# Patient Record
Sex: Female | Born: 1935
Health system: Southern US, Community
[De-identification: ages and names within clinical notes are randomized; demographics above are authoritative.]

## PROBLEM LIST (undated history)

## (undated) DIAGNOSIS — E785 Hyperlipidemia, unspecified: Secondary | ICD-10-CM

## (undated) DIAGNOSIS — I10 Essential (primary) hypertension: Secondary | ICD-10-CM

## (undated) DIAGNOSIS — F32A Depression, unspecified: Secondary | ICD-10-CM

## (undated) DIAGNOSIS — M359 Systemic involvement of connective tissue, unspecified: Secondary | ICD-10-CM

## (undated) DIAGNOSIS — F419 Anxiety disorder, unspecified: Secondary | ICD-10-CM

## (undated) DIAGNOSIS — K219 Gastro-esophageal reflux disease without esophagitis: Secondary | ICD-10-CM

## (undated) DIAGNOSIS — IMO0002 Reserved for concepts with insufficient information to code with codable children: Secondary | ICD-10-CM

## (undated) DIAGNOSIS — K449 Diaphragmatic hernia without obstruction or gangrene: Secondary | ICD-10-CM

## (undated) DIAGNOSIS — F329 Major depressive disorder, single episode, unspecified: Secondary | ICD-10-CM

## (undated) HISTORY — DX: Anxiety disorder, unspecified: F41.9

## (undated) HISTORY — PX: RECTOCELE REPAIR: SHX761

## (undated) HISTORY — DX: Depression, unspecified: F32.A

## (undated) HISTORY — DX: Gastro-esophageal reflux disease without esophagitis: K21.9

## (undated) HISTORY — DX: Reserved for concepts with insufficient information to code with codable children: IMO0002

## (undated) HISTORY — PX: CYSTOCELE REPAIR: SHX163

## (undated) HISTORY — DX: Hyperlipidemia, unspecified: E78.5

## (undated) HISTORY — PX: BREAST SURGERY: SHX581

## (undated) HISTORY — DX: Major depressive disorder, single episode, unspecified: F32.9

---

## 1995-12-01 HISTORY — PX: ABDOMINAL HYSTERECTOMY: SHX81

## 2004-12-30 ENCOUNTER — Ambulatory Visit: Payer: Self-pay | Admitting: Internal Medicine

## 2006-05-27 ENCOUNTER — Ambulatory Visit: Payer: Self-pay | Admitting: Internal Medicine

## 2007-05-31 ENCOUNTER — Ambulatory Visit: Payer: Self-pay | Admitting: Internal Medicine

## 2008-06-04 ENCOUNTER — Ambulatory Visit: Payer: Self-pay | Admitting: Internal Medicine

## 2009-07-05 ENCOUNTER — Ambulatory Visit: Payer: Self-pay | Admitting: Internal Medicine

## 2010-09-24 ENCOUNTER — Ambulatory Visit: Payer: Self-pay | Admitting: Internal Medicine

## 2011-12-07 ENCOUNTER — Ambulatory Visit: Payer: Self-pay | Admitting: Ophthalmology

## 2011-12-07 DIAGNOSIS — I498 Other specified cardiac arrhythmias: Secondary | ICD-10-CM | POA: Diagnosis not present

## 2011-12-07 DIAGNOSIS — H269 Unspecified cataract: Secondary | ICD-10-CM | POA: Diagnosis not present

## 2011-12-07 DIAGNOSIS — Z79899 Other long term (current) drug therapy: Secondary | ICD-10-CM | POA: Diagnosis not present

## 2011-12-07 DIAGNOSIS — M129 Arthropathy, unspecified: Secondary | ICD-10-CM | POA: Diagnosis not present

## 2011-12-07 DIAGNOSIS — M545 Low back pain, unspecified: Secondary | ICD-10-CM | POA: Diagnosis not present

## 2011-12-07 DIAGNOSIS — R42 Dizziness and giddiness: Secondary | ICD-10-CM | POA: Diagnosis not present

## 2011-12-07 DIAGNOSIS — E785 Hyperlipidemia, unspecified: Secondary | ICD-10-CM | POA: Diagnosis not present

## 2011-12-07 DIAGNOSIS — I10 Essential (primary) hypertension: Secondary | ICD-10-CM | POA: Diagnosis not present

## 2011-12-07 DIAGNOSIS — E7800 Pure hypercholesterolemia, unspecified: Secondary | ICD-10-CM | POA: Diagnosis not present

## 2011-12-07 DIAGNOSIS — H2510 Age-related nuclear cataract, unspecified eye: Secondary | ICD-10-CM | POA: Diagnosis not present

## 2011-12-07 DIAGNOSIS — K219 Gastro-esophageal reflux disease without esophagitis: Secondary | ICD-10-CM | POA: Diagnosis not present

## 2012-01-11 DIAGNOSIS — Z79899 Other long term (current) drug therapy: Secondary | ICD-10-CM | POA: Diagnosis not present

## 2012-01-11 DIAGNOSIS — M949 Disorder of cartilage, unspecified: Secondary | ICD-10-CM | POA: Diagnosis not present

## 2012-01-11 DIAGNOSIS — E7800 Pure hypercholesterolemia, unspecified: Secondary | ICD-10-CM | POA: Diagnosis not present

## 2012-01-11 DIAGNOSIS — I10 Essential (primary) hypertension: Secondary | ICD-10-CM | POA: Diagnosis not present

## 2012-01-11 DIAGNOSIS — M899 Disorder of bone, unspecified: Secondary | ICD-10-CM | POA: Diagnosis not present

## 2012-03-30 DIAGNOSIS — M549 Dorsalgia, unspecified: Secondary | ICD-10-CM | POA: Diagnosis not present

## 2012-03-30 DIAGNOSIS — I10 Essential (primary) hypertension: Secondary | ICD-10-CM | POA: Diagnosis not present

## 2012-03-30 DIAGNOSIS — M79609 Pain in unspecified limb: Secondary | ICD-10-CM | POA: Diagnosis not present

## 2012-03-30 DIAGNOSIS — E7800 Pure hypercholesterolemia, unspecified: Secondary | ICD-10-CM | POA: Diagnosis not present

## 2012-04-06 DIAGNOSIS — E7800 Pure hypercholesterolemia, unspecified: Secondary | ICD-10-CM | POA: Diagnosis not present

## 2012-04-06 DIAGNOSIS — I10 Essential (primary) hypertension: Secondary | ICD-10-CM | POA: Diagnosis not present

## 2012-04-06 DIAGNOSIS — R5383 Other fatigue: Secondary | ICD-10-CM | POA: Diagnosis not present

## 2012-04-06 DIAGNOSIS — R7989 Other specified abnormal findings of blood chemistry: Secondary | ICD-10-CM | POA: Diagnosis not present

## 2012-04-06 DIAGNOSIS — R5381 Other malaise: Secondary | ICD-10-CM | POA: Diagnosis not present

## 2012-04-06 LAB — LIPID PANEL: Cholesterol: 184 mg/dL (ref 0–200)

## 2012-04-06 LAB — TSH: TSH: 1.44 u[IU]/mL (ref 0.41–5.90)

## 2012-04-06 LAB — BASIC METABOLIC PANEL: Potassium: 4.6 mmol/L (ref 3.4–5.3)

## 2012-05-06 ENCOUNTER — Ambulatory Visit: Payer: Self-pay | Admitting: Internal Medicine

## 2012-05-06 DIAGNOSIS — R928 Other abnormal and inconclusive findings on diagnostic imaging of breast: Secondary | ICD-10-CM | POA: Diagnosis not present

## 2012-05-06 DIAGNOSIS — Z1231 Encounter for screening mammogram for malignant neoplasm of breast: Secondary | ICD-10-CM | POA: Diagnosis not present

## 2012-05-10 LAB — HM MAMMOGRAPHY

## 2012-05-17 DIAGNOSIS — Z1211 Encounter for screening for malignant neoplasm of colon: Secondary | ICD-10-CM | POA: Diagnosis not present

## 2012-09-05 ENCOUNTER — Telehealth: Payer: Self-pay | Admitting: Internal Medicine

## 2012-09-05 MED ORDER — ALPRAZOLAM 0.5 MG PO TABS: 0.5000 mg | ORAL_TABLET | Freq: Every evening | ORAL | Status: DC | PRN

## 2012-09-05 NOTE — Telephone Encounter (Signed)
Ok to refill alprazolam .5mg  q day prn #30 with one refill.

## 2012-09-05 NOTE — Telephone Encounter (Signed)
Caller: Joory/Patient; Phone: [phone]; Reason for Call: Patient calling to get a refill on her medication Alprazolam 0.  5mg .  She uses [facility] in [place].  Thanks

## 2012-09-05 NOTE — Telephone Encounter (Signed)
[facility] 

## 2012-09-30 ENCOUNTER — Ambulatory Visit: Payer: Self-pay | Admitting: Internal Medicine

## 2012-10-21 DIAGNOSIS — E7800 Pure hypercholesterolemia, unspecified: Secondary | ICD-10-CM | POA: Diagnosis not present

## 2012-10-21 DIAGNOSIS — I10 Essential (primary) hypertension: Secondary | ICD-10-CM | POA: Diagnosis not present

## 2012-10-25 ENCOUNTER — Ambulatory Visit: Payer: Self-pay | Admitting: Internal Medicine

## 2012-10-27 ENCOUNTER — Ambulatory Visit: Payer: Self-pay | Admitting: Internal Medicine

## 2012-10-28 ENCOUNTER — Ambulatory Visit: Payer: Self-pay | Admitting: Internal Medicine

## 2012-11-04 DIAGNOSIS — M19049 Primary osteoarthritis, unspecified hand: Secondary | ICD-10-CM | POA: Diagnosis not present

## 2012-11-04 DIAGNOSIS — E7800 Pure hypercholesterolemia, unspecified: Secondary | ICD-10-CM | POA: Diagnosis not present

## 2012-11-04 DIAGNOSIS — I10 Essential (primary) hypertension: Secondary | ICD-10-CM | POA: Diagnosis not present

## 2012-11-17 DIAGNOSIS — R000 Tachycardia, unspecified: Secondary | ICD-10-CM | POA: Diagnosis not present

## 2012-11-17 DIAGNOSIS — E782 Mixed hyperlipidemia: Secondary | ICD-10-CM | POA: Diagnosis not present

## 2012-11-17 DIAGNOSIS — I10 Essential (primary) hypertension: Secondary | ICD-10-CM | POA: Diagnosis not present

## 2012-11-17 DIAGNOSIS — I471 Supraventricular tachycardia: Secondary | ICD-10-CM | POA: Diagnosis not present

## 2012-12-06 DIAGNOSIS — I471 Supraventricular tachycardia: Secondary | ICD-10-CM | POA: Diagnosis not present

## 2012-12-16 DIAGNOSIS — R0602 Shortness of breath: Secondary | ICD-10-CM | POA: Diagnosis not present

## 2012-12-23 DIAGNOSIS — I10 Essential (primary) hypertension: Secondary | ICD-10-CM | POA: Diagnosis not present

## 2012-12-23 DIAGNOSIS — E782 Mixed hyperlipidemia: Secondary | ICD-10-CM | POA: Diagnosis not present

## 2013-01-20 DIAGNOSIS — H2510 Age-related nuclear cataract, unspecified eye: Secondary | ICD-10-CM | POA: Diagnosis not present

## 2013-04-14 ENCOUNTER — Ambulatory Visit: Payer: Self-pay | Admitting: Internal Medicine

## 2013-05-01 ENCOUNTER — Ambulatory Visit: Payer: Self-pay | Admitting: Internal Medicine

## 2013-05-04 ENCOUNTER — Encounter: Payer: Self-pay | Admitting: Internal Medicine

## 2013-05-04 ENCOUNTER — Ambulatory Visit (INDEPENDENT_AMBULATORY_CARE_PROVIDER_SITE_OTHER): Payer: Medicare Other | Admitting: Internal Medicine

## 2013-05-04 VITALS — BP 130/80 | HR 63 | Temp 98.6°F | Ht 62.0 in | Wt 178.0 lb

## 2013-05-04 DIAGNOSIS — E7800 Pure hypercholesterolemia, unspecified: Secondary | ICD-10-CM

## 2013-05-04 DIAGNOSIS — R5383 Other fatigue: Secondary | ICD-10-CM | POA: Diagnosis not present

## 2013-05-04 DIAGNOSIS — Z1239 Encounter for other screening for malignant neoplasm of breast: Secondary | ICD-10-CM

## 2013-05-04 DIAGNOSIS — G8929 Other chronic pain: Secondary | ICD-10-CM

## 2013-05-04 DIAGNOSIS — I10 Essential (primary) hypertension: Secondary | ICD-10-CM

## 2013-05-04 DIAGNOSIS — M549 Dorsalgia, unspecified: Secondary | ICD-10-CM

## 2013-05-04 DIAGNOSIS — F411 Generalized anxiety disorder: Secondary | ICD-10-CM

## 2013-05-04 DIAGNOSIS — R5381 Other malaise: Secondary | ICD-10-CM

## 2013-05-04 DIAGNOSIS — F419 Anxiety disorder, unspecified: Secondary | ICD-10-CM

## 2013-05-06 ENCOUNTER — Encounter: Payer: Self-pay | Admitting: Internal Medicine

## 2013-05-06 ENCOUNTER — Other Ambulatory Visit: Payer: Self-pay | Admitting: Internal Medicine

## 2013-05-06 DIAGNOSIS — M549 Dorsalgia, unspecified: Secondary | ICD-10-CM | POA: Insufficient documentation

## 2013-05-06 DIAGNOSIS — F419 Anxiety disorder, unspecified: Secondary | ICD-10-CM | POA: Insufficient documentation

## 2013-05-06 MED ORDER — ROSUVASTATIN CALCIUM 10 MG PO TABS: ORAL_TABLET | ORAL | Status: DC

## 2013-05-06 MED ORDER — METOPROLOL SUCCINATE ER 25 MG PO TB24: ORAL_TABLET | ORAL | Status: DC

## 2013-05-06 NOTE — Assessment & Plan Note (Signed)
Desires no further intervention at this time.  Follow  

## 2013-05-06 NOTE — Assessment & Plan Note (Signed)
Blood pressure as outlined.  Same medication regimen.  Follow. Check metabolic panel.

## 2013-05-06 NOTE — Progress Notes (Signed)
Refilled toprol and crestor for 3 months with 3 refills.

## 2013-05-06 NOTE — Assessment & Plan Note (Signed)
On Crestor.  Low cholesterol diet and exercise as tolerated.  Follow.  

## 2013-05-06 NOTE — Assessment & Plan Note (Signed)
Overall stable.  Takes xanax prn.  Follow.

## 2013-05-06 NOTE — Progress Notes (Signed)
Subjective:    [name], female    DOB: March 06, 1930, 79 y.o.   MRN: [id number]  HPI 79 year old female with past medical history of hypercholesterolemia, degenerative disc disease, back pain, hypertension and increased anxiety.  She comes in today to follow up on these issues as well as for a  Complete physical exam.  She states she is doing relatively well.  Still coping with her husband's death.  Feels she is doing relatively well.  She has been doing a lot of mowing and clipping hedges.  This aggravates her back and hip - at times.  Takes tramadol and ibuprofen prn.  Desires no further intervention at this time.  She did have an episode several months ago - where she rolled over and noticed some increased heart rate.  Saw Dr Lady Gary.  Had ECHO.  States everything checked out fine.  Has not had any further problems since.  Overall she feels things are stable.    Past Medical History  Diagnosis Date  . Hyperlipidemia   . Degenerative disc disease   . Anxiety   . Depression   . GERD (gastroesophageal reflux disease)     Current Outpatient Prescriptions on File Prior to Visit  Medication Sig Dispense Refill  . ALPRAZolam (XANAX) 0.5 MG tablet Take 1 tablet (0.5 mg total) by mouth at bedtime as needed.  30 tablet  1  . aspirin 81 MG tablet Take 81 mg by mouth daily.      . lansoprazole (PREVACID) 30 MG capsule Take 30 mg by mouth every other day. Take 1 capsule by mouth every morning. ( At least 30 minutes prior to food)      . metoprolol succinate (TOPROL-XL) 25 MG 24 hr tablet Take 1/2 tablets by mouth twice a day      . RABEprazole (ACIPHEX) 20 MG tablet Take 20 mg by mouth daily.      . rosuvastatin (CRESTOR) 10 MG tablet Take 5 mg by mouth. Take 1/2 tablets by mouth twice a week      . traMADol (ULTRAM) 50 MG tablet Take 1 tablet by mouth twice a day as needed for pain      . meloxicam (MOBIC) 7.5 MG tablet Take 1 tablet by mouth once a day with meals      . pravastatin  (PRAVACHOL) 20 MG tablet Take 20 mg by mouth daily. Every night       No current facility-administered medications on file prior to visit.    Review of Systems Patient denies any headache, lightheadedness or dizziness.  No significant sinus or allergy symptoms.  No chest pain, tightness or palpitations.  No increased shortness of breath, cough or congestion.  No nausea or vomiting.  No acid reflux.  No abdominal pain or cramping.  No bowel change, such as diarrhea, constipation, BRBPR or melana.  No urine change.  Some persistent back and hip pain as outlined.  Does report some increased fatigue.  Overall feels she is doing relatively well.      Objective:   Physical Exam Filed Vitals:   05/04/13 1026  BP: 130/80  Pulse: 63  Temp: 98.6 F (70 C)   79 year old female in no acute distress.   HEENT:  Nares- clear.  Oropharynx - without lesions. NECK:  Supple.  Nontender.  No audible bruit.  HEART:  Appears to be regular. LUNGS:  No crackles or wheezing audible.  Respirations even and unlabored.  RADIAL PULSE:  Equal  bilaterally.    BREASTS:  No nipple discharge or nipple retraction present.  Could not appreciate any distinct nodules or axillary adenopathy.  ABDOMEN:  Soft, nontender.  Bowel sounds present and normal.  No audible abdominal bruit.  GU:  Normal external genitalia.  Vaginal vault without lesions.  S/p hysterectomy.  Could not appreciate any adnexal masses or tenderness.   RECTAL:  Heme negative.   EXTREMITIES:  No increased edema present.  DP pulses palpable and equal bilaterally.       Assessment & Plan:  CARDIOVASCULAR.  Stable.   FATIGUE.  Check cbc, met c and tsh.    HEALTH MAINTENANCE.  Physical today.  Schedule mammogram.  Declines colonoscopy.

## 2013-05-12 ENCOUNTER — Other Ambulatory Visit (INDEPENDENT_AMBULATORY_CARE_PROVIDER_SITE_OTHER): Payer: Medicare Other

## 2013-05-12 DIAGNOSIS — I10 Essential (primary) hypertension: Secondary | ICD-10-CM | POA: Diagnosis not present

## 2013-05-12 DIAGNOSIS — E7800 Pure hypercholesterolemia, unspecified: Secondary | ICD-10-CM

## 2013-05-12 DIAGNOSIS — R5383 Other fatigue: Secondary | ICD-10-CM | POA: Diagnosis not present

## 2013-05-12 DIAGNOSIS — R5381 Other malaise: Secondary | ICD-10-CM | POA: Diagnosis not present

## 2013-05-12 LAB — CBC WITH DIFFERENTIAL/PLATELET
Basophils Relative: 0.7 % (ref 0.0–3.0)
Eosinophils Relative: 1.4 % (ref 0.0–5.0)
Lymphocytes Relative: 28.4 % (ref 12.0–46.0)
MCV: 91.9 fl (ref 78.0–100.0)
Monocytes Absolute: 0.6 10*3/uL (ref 0.1–1.0)
Monocytes Relative: 13 % — ABNORMAL HIGH (ref 3.0–12.0)
Neutrophils Relative %: 56.5 % (ref 43.0–77.0)
Platelets: 287 10*3/uL (ref 150.0–400.0)
RBC: 4.65 Mil/uL (ref 3.87–5.11)
WBC: 5 10*3/uL (ref 4.5–10.5)

## 2013-05-12 LAB — BASIC METABOLIC PANEL
BUN: 14 mg/dL (ref 6–23)
Creatinine, Ser: 0.7 mg/dL (ref 0.4–1.2)
GFR: 88.21 mL/min (ref >60.00)
Glucose, Bld: 96 mg/dL (ref 70–99)
Potassium: 4.5 mEq/L (ref 3.5–5.1)

## 2013-05-12 LAB — LIPID PANEL
Cholesterol: 166 mg/dL (ref 0–200)
LDL Cholesterol: 99 mg/dL (ref 0–99)
Triglycerides: 112 mg/dL (ref 0.0–149.0)
VLDL: 22.4 mg/dL (ref 0.0–40.0)

## 2013-05-12 LAB — HEPATIC FUNCTION PANEL
ALT: 15 U/L (ref 0–35)
AST: 20 U/L (ref 0–37)
Albumin: 3.5 g/dL (ref 3.5–5.2)

## 2013-05-12 LAB — TSH: TSH: 1.18 u[IU]/mL (ref 0.35–5.50)

## 2013-05-15 ENCOUNTER — Encounter: Payer: Self-pay | Admitting: *Deleted

## 2013-05-16 ENCOUNTER — Telehealth: Payer: Self-pay | Admitting: Internal Medicine

## 2013-05-16 NOTE — Telephone Encounter (Signed)
Letter mailed yesterday-pt informed of results

## 2013-05-16 NOTE — Telephone Encounter (Signed)
Patient wanting her lab results and a copy mailed too.

## 2013-05-25 ENCOUNTER — Telehealth: Payer: Self-pay | Admitting: *Deleted

## 2013-05-25 NOTE — Telephone Encounter (Signed)
Has she been taking this and did I prescribe it.  If has been taking regularly and I have been prescribing - then ok to refill mobic 7.5mg  q day prn #30 with no refills.

## 2013-05-25 NOTE — Telephone Encounter (Signed)
Patient would like to know if you will give new script for Meloxicam  For back pain, please advise.

## 2013-05-26 ENCOUNTER — Other Ambulatory Visit: Payer: Self-pay | Admitting: *Deleted

## 2013-05-26 MED ORDER — MELOXICAM 7.5 MG PO TABS: 7.5000 mg | ORAL_TABLET | ORAL | Status: DC | PRN

## 2013-05-26 NOTE — Telephone Encounter (Signed)
Refilled Mobic #30 - pt states that Dr. Artis Flock prescribed it about 5 years ago & she no longer sees him.

## 2013-06-19 ENCOUNTER — Telehealth: Payer: Self-pay | Admitting: Internal Medicine

## 2013-06-19 MED ORDER — RABEPRAZOLE SODIUM 20 MG PO TBEC: 20.0000 mg | DELAYED_RELEASE_TABLET | Freq: Every day | ORAL | Status: DC

## 2013-06-19 MED ORDER — ALPRAZOLAM 0.5 MG PO TABS: 0.5000 mg | ORAL_TABLET | Freq: Every evening | ORAL | Status: DC | PRN

## 2013-06-19 NOTE — Telephone Encounter (Signed)
States she has spoken with [facility] mail order about why she has not received Aciphex 20 mg #90 pills.  Was told it was now rabeprazole and pt needs a new Rx to order by this name.  Pt states she only has #3 pills left.  Also wondering if you should see if she has taken this before to be sure she has not had side effects to it.  Pt states she is allergic to a lot of stuff.

## 2013-06-19 NOTE — Telephone Encounter (Signed)
Pt also [facility] [place]-please advise (reflux med was sent to [facility] & pt informed)

## 2013-06-19 NOTE — Telephone Encounter (Signed)
Ok's refill for xanax #30 with one refill.  Also regarding the aciphex, this is just the generic for aciphex - should tolerate.  Thanks.

## 2013-06-29 ENCOUNTER — Ambulatory Visit: Payer: Self-pay | Admitting: Internal Medicine

## 2013-06-29 DIAGNOSIS — R928 Other abnormal and inconclusive findings on diagnostic imaging of breast: Secondary | ICD-10-CM | POA: Diagnosis not present

## 2013-06-29 DIAGNOSIS — Z1231 Encounter for screening mammogram for malignant neoplasm of breast: Secondary | ICD-10-CM | POA: Diagnosis not present

## 2013-06-29 LAB — HM MAMMOGRAPHY

## 2013-07-02 ENCOUNTER — Encounter: Payer: Self-pay | Admitting: Internal Medicine

## 2013-07-06 ENCOUNTER — Encounter: Payer: Self-pay | Admitting: Internal Medicine

## 2013-07-13 DIAGNOSIS — D3709 Neoplasm of uncertain behavior of other specified sites of the oral cavity: Secondary | ICD-10-CM | POA: Diagnosis not present

## 2013-07-13 DIAGNOSIS — D3705 Neoplasm of uncertain behavior of pharynx: Secondary | ICD-10-CM | POA: Diagnosis not present

## 2013-08-03 ENCOUNTER — Other Ambulatory Visit: Payer: Self-pay | Admitting: *Deleted

## 2013-08-03 MED ORDER — ALPRAZOLAM 0.5 MG PO TABS: 0.5000 mg | ORAL_TABLET | Freq: Every evening | ORAL | Status: DC | PRN

## 2013-08-03 NOTE — Telephone Encounter (Signed)
Pt called today to request a refill on her Xanax. However, she would like to get a few more than 30 due to having to taking an additional one here & there when her BP increases. Please advise (pt also states that she can not come to office to pick up Rx because she does not drive & lives in [place]. Her daughter has to take off just to get her here). Her next appt is in December

## 2013-08-03 NOTE — Telephone Encounter (Signed)
i refilled #45 with no refills.  She will need to come in for questionnaire, etc prior to next rx.

## 2013-08-03 NOTE — Telephone Encounter (Signed)
Pt notified that Rx was faxed to [facility] [place]- And aware that she will NTBS for further refills

## 2013-08-04 ENCOUNTER — Other Ambulatory Visit: Payer: Self-pay | Admitting: *Deleted

## 2013-08-04 NOTE — Telephone Encounter (Signed)
Called pharmacy & advised that pt can take 2nd pill prn

## 2013-08-04 NOTE — Telephone Encounter (Signed)
Needing a call back from the nurse the prescription instruction for the patient's Alprazolam should have changed. [facility] [place]

## 2013-08-07 ENCOUNTER — Telehealth: Payer: Self-pay | Admitting: Internal Medicine

## 2013-08-07 MED ORDER — ALPRAZOLAM 0.5 MG PO TABS: 0.5000 mg | ORAL_TABLET | Freq: Every evening | ORAL | Status: DC | PRN

## 2013-08-07 NOTE — Telephone Encounter (Signed)
Alprazolam directions should have changed but Rx states the same as previous.  Quantity did change from #30 to #45.  Please advise correct instructions for medication.

## 2013-08-07 NOTE — Telephone Encounter (Signed)
Updated directions.

## 2013-08-16 ENCOUNTER — Telehealth: Payer: Self-pay | Admitting: Internal Medicine

## 2013-08-16 NOTE — Telephone Encounter (Signed)
Pt called wanting to know if she can take a whole She wakes up in morning (2 times) with her heart beat un even and when she checks her bp on her machine it won't read comes up error.  She will walk around take an asprin and her bp meds 1/2 xanax  And in about 1 hour she is feeling better Sent pt to triage

## 2013-08-16 NOTE — Telephone Encounter (Signed)
Pt states that she has seen Cardiology a few months ago. She will call them today to let them know about today's incident. However, she feels like this is related to waking up from her dream this morning. She said this happens sometimes. She didn't feel that it was urgent enough to be seen today. Pt advised to contact cardiology if this happens again & to keep her appt on 10/8. Pt verbalized understanding

## 2013-08-16 NOTE — Telephone Encounter (Signed)
Patient Information:  Caller Name: Linea  Phone: [phone]  Patient: Sandra Reyes  Gender: Female  DOB: 08/22/1930  Age: 79 Years  PCP: Dale Alexander  Office Follow Up:  Does the office need to follow up with this patient?: No  Instructions For The Office: N/A  RN Note:  Similiar arrythmia episode in past 6 months. States unable to come to office 08/16/13 due to no one available to bring her.  Reluctant to bother daughters who work. [place]  Has life alert system. Plans to keep previously scheduled appointment for 09/06/13.  Agreed not to adjust medication dose until sees MD.  Symptoms  Reason For Call & Symptoms: Woke up with palpitations that resolved within 1 hour.  BP initially would not read; then was 157/86 in left arm with pulse 77.  Asking if should take whole Toprol XL instead of 1/2 tab.  Reports some recent increase of anxiety and feeling lonely.  Reviewed Health History In EMR: Yes  Reviewed Medications In EMR: Yes  Reviewed Allergies In EMR: Yes  Reviewed Surgeries / Procedures: Yes  Date of Onset of Symptoms: 08/16/2013  Treatments Tried: took whole Toprol XL instead of half, Xanax, low dose ASA, walked around, drank fluids  Treatments Tried Worked: Yes  Guideline(s) Used:  Heart Rate and Heartbeat Questions  Disposition Per Guideline:   See Today in Office  Reason For Disposition Reached:   Heart beating very rapidly (e.g., > 140 / minute) and not present now (EXCEPTION: during exercise)  Advice Given:  Reassurance  Everybody has palpitations at some point in their lives. Sometimes it is simply a heightened awareness of the heart's normal beating.  Occasional extra heart beats are experienced by most everyone. Lack of sleep, stress, and caffeinated beverages can make palpitations worse.  Patients with anxiety or stress may describe a "rapid heartbeat" or "pounding" in their chest from their heart beating.  Health Basics  Exercise: Regular exercise will improve your overall health, improve your mood, and is a simple method to reduce stress.  Liquid Intake: Drink adequate liquids, 6-8 glasses of water daily.  Call Back If:  Chest pain, lightheadedness, or difficulty breathing occurs  Heart beating more than 130 beats / minute  More than 3 extra or skipped beats / minute  You become worse.  Patient Refused Recommendation:  Patient Refused Appt, Patient Requests Appt At Later Date  Has appointment for 09/06/13

## 2013-08-16 NOTE — Telephone Encounter (Signed)
Reviewed her message.  She sees Dr Lady Gary for increased heart rate.  Given she had difficulty getting a blood pressure and increased heart rate, etc - rec needs eval.  rec appt with cardiology.  Let me know if agreeable and I will place order for referral.

## 2013-08-17 DIAGNOSIS — H2510 Age-related nuclear cataract, unspecified eye: Secondary | ICD-10-CM | POA: Diagnosis not present

## 2013-09-05 ENCOUNTER — Encounter: Payer: Self-pay | Admitting: *Deleted

## 2013-09-06 ENCOUNTER — Encounter: Payer: Self-pay | Admitting: Internal Medicine

## 2013-09-06 ENCOUNTER — Ambulatory Visit (INDEPENDENT_AMBULATORY_CARE_PROVIDER_SITE_OTHER): Payer: Medicare Other | Admitting: Internal Medicine

## 2013-09-06 VITALS — BP 110/62 | HR 59 | Temp 98.0°F | Ht 62.0 in | Wt 181.0 lb

## 2013-09-06 DIAGNOSIS — R269 Unspecified abnormalities of gait and mobility: Secondary | ICD-10-CM

## 2013-09-06 DIAGNOSIS — I10 Essential (primary) hypertension: Secondary | ICD-10-CM | POA: Diagnosis not present

## 2013-09-06 DIAGNOSIS — E7800 Pure hypercholesterolemia, unspecified: Secondary | ICD-10-CM | POA: Diagnosis not present

## 2013-09-06 DIAGNOSIS — R2681 Unsteadiness on feet: Secondary | ICD-10-CM

## 2013-09-06 DIAGNOSIS — G609 Hereditary and idiopathic neuropathy, unspecified: Secondary | ICD-10-CM

## 2013-09-06 DIAGNOSIS — G8929 Other chronic pain: Secondary | ICD-10-CM

## 2013-09-06 DIAGNOSIS — F411 Generalized anxiety disorder: Secondary | ICD-10-CM

## 2013-09-06 DIAGNOSIS — M549 Dorsalgia, unspecified: Secondary | ICD-10-CM

## 2013-09-06 DIAGNOSIS — Z23 Encounter for immunization: Secondary | ICD-10-CM

## 2013-09-06 DIAGNOSIS — G629 Polyneuropathy, unspecified: Secondary | ICD-10-CM

## 2013-09-06 DIAGNOSIS — F419 Anxiety disorder, unspecified: Secondary | ICD-10-CM

## 2013-09-06 MED ORDER — ALPRAZOLAM 0.5 MG PO TABS: 0.5000 mg | ORAL_TABLET | Freq: Every evening | ORAL | Status: DC | PRN

## 2013-09-06 NOTE — Progress Notes (Signed)
Subjective:    [name], female    DOB: Aug 02, 1930, 79 y.o.   MRN: [id number]  HPI 79 year old female with past medical history of hypercholesterolemia, degenerative disc disease, back pain, hypertension and increased anxiety.  She comes in today for a scheduled follow up.  She states she is doing relatively well.  Still coping with her husband's death.  Some increased stress related to this.  Also increased stress with her gums.  Has had some issues with her teeth.  Increased stress related to this as well.  Teeth and gums are better.  She recently had some issues with increased heart rate and blood pressure variations.  Better now.  Has seen Dr Lady Gary.  Had ECHO.  States everything checked out fine.  Overall she feels things are stable.  States usually her heart rate is 60-70 and blood pressure - ok.  Occasionally will increase in the 140s.     Past Medical History  Diagnosis Date  . Hyperlipidemia   . Degenerative disc disease   . Anxiety   . Depression   . GERD (gastroesophageal reflux disease)     Current Outpatient Prescriptions on File Prior to Visit  Medication Sig Dispense Refill  . ALPRAZolam (XANAX) 0.5 MG tablet Take 1 tablet (0.5 mg total) by mouth at bedtime as needed for anxiety.  45 tablet  0  . aspirin 81 MG tablet Take 81 mg by mouth daily.      . metoprolol succinate (TOPROL-XL) 25 MG 24 hr tablet Take 1/2 tablets by mouth twice a day  90 tablet  3  . RABEprazole (ACIPHEX) 20 MG tablet Take 1 tablet (20 mg total) by mouth daily.  90 tablet  3  . traMADol (ULTRAM) 50 MG tablet Take 1 tablet by mouth twice a day as needed for pain       No current facility-administered medications on file prior to visit.    Review of Systems Patient denies any headache, lightheadedness or dizziness.  No significant sinus or allergy symptoms.  No chest pain, tightness.  Some previous increased heart rate and palpitations.  Better now.   No increased shortness of breath, cough  or congestion.  No nausea or vomiting.  No acid reflux.  No abdominal pain or cramping.  No bowel change, such as diarrhea, constipation, BRBPR or melana.  No urine change.  Some persistent back and hip pain as outlined.  Does report some increased fatigue.  Overall feels she is doing relatively well.       Objective:   Physical Exam  Filed Vitals:   09/06/13 0903  BP: 110/62  Pulse: 59  Temp: 98 F (18.51 C)   79 year old female in no acute distress.   HEENT:  Nares- clear.  Oropharynx - without lesions. NECK:  Supple.  Nontender.  No audible bruit.  HEART:  Appears to be regular. LUNGS:  No crackles or wheezing audible.  Respirations even and unlabored.  RADIAL PULSE:  Equal bilaterally.   ABDOMEN:  Soft, nontender.  Bowel sounds present and normal.  No audible abdominal bruit.    EXTREMITIES:  No increased edema present.  DP pulses palpable and equal bilaterally.   NEUROLOGY:  Decreased sensation pin prick bilateral feet that extends up to the ankle.      Assessment & Plan:  CARDIOVASCULAR.  Stable.   NEUROLOGY.  Persistent foot and leg pain and decreased sensation.  Check B12.  Check NCS of the lower extremities.  HEALTH MAINTENANCE.  Physical 05/04/13.  Mammogram 06/29/13 - Birads II.  Declines colonoscopy.

## 2013-09-08 DIAGNOSIS — Z79899 Other long term (current) drug therapy: Secondary | ICD-10-CM | POA: Diagnosis not present

## 2013-09-10 ENCOUNTER — Encounter: Payer: Self-pay | Admitting: Internal Medicine

## 2013-09-10 NOTE — Assessment & Plan Note (Signed)
Desires no further intervention at this time.  Follow  

## 2013-09-10 NOTE — Assessment & Plan Note (Signed)
Blood pressure as outlined.  Same medication regimen.  Follow. Follow metabolic panel.  

## 2013-09-10 NOTE — Assessment & Plan Note (Signed)
Overall stable.  Takes xanax prn.  Follow.  We discussed other treatment options.  States she has tried other medications and did no tolerate.  Feels the xanax works well for her.  Follow.    

## 2013-09-10 NOTE — Assessment & Plan Note (Signed)
On Crestor.  Low cholesterol diet and exercise as tolerated.  Follow.  

## 2013-09-12 DIAGNOSIS — G609 Hereditary and idiopathic neuropathy, unspecified: Secondary | ICD-10-CM | POA: Diagnosis not present

## 2013-10-05 ENCOUNTER — Other Ambulatory Visit: Payer: Self-pay | Admitting: Internal Medicine

## 2013-10-05 NOTE — Telephone Encounter (Signed)
Need to clarify how often she is taking the xanax.  She get #45 on 09/06/13.  Per my note she was only taking prn.  Is she out now?  I can refill x 1, but if she is using it frequently, will need to get her in and try another medication to control her anxeity.

## 2013-10-05 NOTE — Telephone Encounter (Signed)
Okay to refill? Last filled on 09/06/13

## 2013-10-09 ENCOUNTER — Other Ambulatory Visit: Payer: Self-pay | Admitting: Internal Medicine

## 2013-10-09 NOTE — Telephone Encounter (Signed)
Pt called checking on her rx   Pt upset about her rx not being refilled.  Pt would like you to call her before you call the rx in Please call pt  [phone] Pt is completely out of her meds

## 2013-10-10 NOTE — Telephone Encounter (Signed)
I think we sent this in yesterday.  Let me know if a problem.  Thanks.

## 2013-11-03 ENCOUNTER — Ambulatory Visit: Payer: Medicare Other | Admitting: Internal Medicine

## 2013-11-08 ENCOUNTER — Other Ambulatory Visit: Payer: Self-pay | Admitting: Internal Medicine

## 2013-11-08 ENCOUNTER — Telehealth: Payer: Self-pay | Admitting: Internal Medicine

## 2013-11-08 NOTE — Telephone Encounter (Signed)
Need to know how much she is taking now?  Will refill x 1, but I need to clarify dose.  If increasing amount, will need to set up appt to discuss.

## 2013-11-08 NOTE — Telephone Encounter (Signed)
Pt is needing refill on her xanax she will be out on sunday

## 2013-11-08 NOTE — Telephone Encounter (Signed)
Okay to refill? 

## 2013-11-08 NOTE — Telephone Encounter (Signed)
Last visit 09/06/13, ok refill? And can you clarify directions? These directions don't match previous Rx

## 2013-11-08 NOTE — Telephone Encounter (Signed)
See other note on pt

## 2013-11-09 MED ORDER — ALPRAZOLAM 0.5 MG PO TABS: ORAL_TABLET | ORAL | Status: DC

## 2013-11-09 NOTE — Telephone Encounter (Signed)
Pt notified.    Rx faxed to pharmacy.

## 2013-11-09 NOTE — Telephone Encounter (Signed)
Pt states she takes 1 tablet at bedtime. When she gets anxious during the day which occurs occasionally, she takes half a tablet. Sometimes she takes another 1/2 tab in the evening. But when she takes the 1/2 tab in the evening, she does not take the whole 1 tablet at bedtime. States some days she does not take at all.

## 2013-11-09 NOTE — Telephone Encounter (Signed)
Ok to refill xanax .5mg  - x 1.  Will monitor.  If increase use, may need to try another medication to help control anxiety and use the xanax as needed.

## 2013-11-09 NOTE — Telephone Encounter (Signed)
Okay to refill? 

## 2013-11-09 NOTE — Telephone Encounter (Signed)
See other note

## 2013-12-13 ENCOUNTER — Other Ambulatory Visit: Payer: Self-pay | Admitting: *Deleted

## 2013-12-13 ENCOUNTER — Telehealth: Payer: Self-pay | Admitting: Internal Medicine

## 2013-12-13 MED ORDER — ALPRAZOLAM 0.5 MG PO TABS: ORAL_TABLET | ORAL | Status: DC

## 2013-12-13 NOTE — Telephone Encounter (Signed)
Pt called to get her xanax refilled Pt has 4 left

## 2013-12-13 NOTE — Telephone Encounter (Signed)
rx printed

## 2013-12-14 NOTE — Telephone Encounter (Signed)
Rx faxed to [facility] [place] [facility] today

## 2014-01-11 ENCOUNTER — Ambulatory Visit: Payer: Medicare Other | Admitting: Internal Medicine

## 2014-01-15 ENCOUNTER — Telehealth: Payer: Self-pay | Admitting: Internal Medicine

## 2014-01-15 MED ORDER — ALPRAZOLAM 0.5 MG PO TABS: ORAL_TABLET | ORAL | Status: DC

## 2014-01-15 NOTE — Telephone Encounter (Signed)
Refilled xanax #45 with no refills.

## 2014-01-15 NOTE — Telephone Encounter (Signed)
Needing it filled today .  ALPRAZolam (XANAX) 0.5 MG tablet

## 2014-02-16 ENCOUNTER — Ambulatory Visit (INDEPENDENT_AMBULATORY_CARE_PROVIDER_SITE_OTHER): Payer: Medicare Other | Admitting: Internal Medicine

## 2014-02-16 ENCOUNTER — Telehealth: Payer: Self-pay | Admitting: Internal Medicine

## 2014-02-16 ENCOUNTER — Encounter: Payer: Self-pay | Admitting: Internal Medicine

## 2014-02-16 VITALS — BP 126/70 | HR 65 | Temp 98.2°F | Ht 62.0 in | Wt 185.5 lb

## 2014-02-16 DIAGNOSIS — G609 Hereditary and idiopathic neuropathy, unspecified: Secondary | ICD-10-CM

## 2014-02-16 DIAGNOSIS — R269 Unspecified abnormalities of gait and mobility: Secondary | ICD-10-CM | POA: Diagnosis not present

## 2014-02-16 DIAGNOSIS — F419 Anxiety disorder, unspecified: Secondary | ICD-10-CM

## 2014-02-16 DIAGNOSIS — F411 Generalized anxiety disorder: Secondary | ICD-10-CM

## 2014-02-16 DIAGNOSIS — G629 Polyneuropathy, unspecified: Secondary | ICD-10-CM

## 2014-02-16 DIAGNOSIS — E7800 Pure hypercholesterolemia, unspecified: Secondary | ICD-10-CM

## 2014-02-16 DIAGNOSIS — R2681 Unsteadiness on feet: Secondary | ICD-10-CM

## 2014-02-16 DIAGNOSIS — I10 Essential (primary) hypertension: Secondary | ICD-10-CM | POA: Diagnosis not present

## 2014-02-16 DIAGNOSIS — H539 Unspecified visual disturbance: Secondary | ICD-10-CM

## 2014-02-16 LAB — BASIC METABOLIC PANEL
BUN: 16 mg/dL (ref 6–23)
CO2: 31 mEq/L (ref 19–32)
CREATININE: 0.7 mg/dL (ref 0.4–1.2)
Calcium: 9.3 mg/dL (ref 8.4–10.5)
Chloride: 100 mEq/L (ref 96–112)
GFR: 81.12 mL/min (ref >60.00)
GLUCOSE: 105 mg/dL — AB (ref 70–99)
Potassium: 4.4 mEq/L (ref 3.5–5.1)
Sodium: 136 mEq/L (ref 135–145)

## 2014-02-16 LAB — HEPATIC FUNCTION PANEL
ALBUMIN: 3.9 g/dL (ref 3.5–5.2)
ALT: 17 U/L (ref 0–35)
AST: 21 U/L (ref 0–37)
Alkaline Phosphatase: 59 U/L (ref 39–117)
BILIRUBIN TOTAL: 0.3 mg/dL (ref 0.3–1.2)
Bilirubin, Direct: 0 mg/dL (ref 0.0–0.3)
Total Protein: 6.9 g/dL (ref 6.0–8.3)

## 2014-02-16 LAB — VITAMIN B12: VITAMIN B 12: 957 pg/mL — AB (ref 211–911)

## 2014-02-16 MED ORDER — ALPRAZOLAM 0.5 MG PO TABS: ORAL_TABLET | ORAL | Status: DC

## 2014-02-16 NOTE — Telephone Encounter (Signed)
Noted  

## 2014-02-16 NOTE — Progress Notes (Signed)
Subjective:    [name], female    DOB: Oct 26, 1930, 80 y.o.   MRN: [id number]  HPI 80 year old female with past medical history of hypercholesterolemia, degenerative disc disease, back pain, hypertension and increased anxiety.  She comes in today for a scheduled follow up.  She states she is doing relatively well.  Still coping with her husband's death.  Some increased stress related to this.  Doing better.   She recently had some issues with increased heart rate and blood pressure variations.  Better now.  Has seen Dr Ubaldo Glassing.  Had ECHO.  States everything checked out fine.  Overall she feels things are stable.  She does report noticing a zigzag and vision change.  Acute change.  No headache associated.  Vision back to baseline now.  No other neurological changes.     Past Medical History  Diagnosis Date  . Hyperlipidemia   . Degenerative disc disease   . Anxiety   . Depression   . GERD (gastroesophageal reflux disease)     Current Outpatient Prescriptions on File Prior to Visit  Medication Sig Dispense Refill  . ALPRAZolam (XANAX) 0.5 MG tablet Take one tablet at bedtime & 1/2 tablet during the day as needed  45 tablet  0  . aspirin 81 MG tablet Take 81 mg by mouth daily.      . Cyanocobalamin (B-12) 500 MCG SUBL Place under the tongue daily.      . metoprolol succinate (TOPROL-XL) 25 MG 24 hr tablet Take 1/2 tablets by mouth twice a day  90 tablet  3  . Multiple Vitamin (MULTIVITAMIN) tablet Take 1 tablet by mouth daily.      . RABEprazole (ACIPHEX) 20 MG tablet Take 1 tablet (20 mg total) by mouth daily.  90 tablet  3  . rosuvastatin (CRESTOR) 10 MG tablet Take 5 mg by mouth 2 (two) times a week. Take 1/2 tablets by mouth qod.      . traMADol (ULTRAM) 50 MG tablet Take 1 tablet by mouth twice a day as needed for pain      . vitamin C (ASCORBIC ACID) 250 MG tablet Take 250 mg by mouth daily.      . vitamin E 400 UNIT capsule Take 400 Units by mouth daily.       No current  facility-administered medications on file prior to visit.    Review of Systems Patient denies any headache, lightheadedness or dizziness. Vision change as outlined.   No significant sinus or allergy symptoms.  No chest pain, tightness.  Some previous increased heart rate and palpitations.  Better now.   No increased shortness of breath, cough or congestion.  No nausea or vomiting.  No acid reflux.  No abdominal pain or cramping.  No bowel change, such as diarrhea, constipation, BRBPR or melana.  No urine change.    Overall feels she is doing relatively well.       Objective:   Physical Exam  Filed Vitals:   02/16/14 1356  BP: 126/70  Pulse: 65  Temp: 98.2 F (36.8 C)   Blood pressure recheck:  13-76/47  80 year old female in no acute distress.   HEENT:  Nares- clear.  Oropharynx - without lesions. NECK:  Supple.  Nontender.  No audible bruit.  HEART:  Appears to be regular. LUNGS:  No crackles or wheezing audible.  Respirations even and unlabored.  RADIAL PULSE:  Equal bilaterally.   ABDOMEN:  Soft, nontender.  Bowel sounds  present and normal.  No audible abdominal bruit.    EXTREMITIES:  No increased edema present.  DP pulses palpable and equal bilaterally.      Assessment & Plan:  CARDIOVASCULAR.  Stable.   HEALTH MAINTENANCE.  Physical 05/04/13.  Mammogram 06/29/13 - Birads II.  Declines colonoscopy.   I spent 40 minutes with the patient and more than 50% of the time was spent in consultation regarding the above.

## 2014-02-16 NOTE — Progress Notes (Signed)
Pre-visit discussion using our clinic review tool. No additional management support is needed unless otherwise documented below in the visit note.  

## 2014-02-16 NOTE — Telephone Encounter (Signed)
FYI  Pt did not want to schedule a 3 month cpx.  She wanted to schedule a 6 month follow up 08/24/14

## 2014-02-18 ENCOUNTER — Encounter: Payer: Self-pay | Admitting: Internal Medicine

## 2014-02-18 DIAGNOSIS — G629 Polyneuropathy, unspecified: Secondary | ICD-10-CM | POA: Insufficient documentation

## 2014-02-18 DIAGNOSIS — H539 Unspecified visual disturbance: Secondary | ICD-10-CM | POA: Insufficient documentation

## 2014-02-18 NOTE — Assessment & Plan Note (Signed)
Blood pressure as outlined.  Same medication regimen.  Follow. Follow metabolic panel.  

## 2014-02-18 NOTE — Assessment & Plan Note (Signed)
On Crestor.  Low cholesterol diet and exercise as tolerated.  Follow.  

## 2014-02-18 NOTE — Assessment & Plan Note (Signed)
Vision change as outlined.  No history of migraines.  No headache.  Sees Dr George Ina.  Needs earlier appt.  Also wanted to check carotid ultrasound.  She will notify me if she is agreeable to proceed.  Start aspirin daily.  Follow.  She declines any further w/up at this time.  We discussed possible etiologies.  She continues to decline further w/up.

## 2014-02-18 NOTE — Assessment & Plan Note (Signed)
Overall stable.  Takes xanax prn.  Follow.  We discussed other treatment options.  States she has tried other medications and did no tolerate.  Feels the xanax works well for her.  Follow.

## 2014-02-18 NOTE — Assessment & Plan Note (Signed)
Nerve conduction studies appear to be c/w neuropathy.  Discussed with her today.  Discussed treatment options.  She declines.  Wants to monitor.  Check B12.

## 2014-02-19 ENCOUNTER — Encounter: Payer: Self-pay | Admitting: *Deleted

## 2014-02-19 ENCOUNTER — Telehealth: Payer: Self-pay | Admitting: Internal Medicine

## 2014-02-19 DIAGNOSIS — H539 Unspecified visual disturbance: Secondary | ICD-10-CM

## 2014-02-19 NOTE — Telephone Encounter (Signed)
The patient called asking about her referral for her carotid artery.Please advise.

## 2014-02-19 NOTE — Telephone Encounter (Signed)
See note from 02/16/14-pt ready to proceed with referral

## 2014-02-20 DIAGNOSIS — H2510 Age-related nuclear cataract, unspecified eye: Secondary | ICD-10-CM | POA: Diagnosis not present

## 2014-02-20 NOTE — Telephone Encounter (Signed)
Order placed for carotid ultrasound.  Amber to call pt with appt.

## 2014-03-08 DIAGNOSIS — I6529 Occlusion and stenosis of unspecified carotid artery: Secondary | ICD-10-CM | POA: Diagnosis not present

## 2014-04-03 ENCOUNTER — Other Ambulatory Visit: Payer: Self-pay | Admitting: *Deleted

## 2014-04-03 ENCOUNTER — Telehealth: Payer: Self-pay | Admitting: Internal Medicine

## 2014-04-03 MED ORDER — METOPROLOL SUCCINATE ER 25 MG PO TB24: ORAL_TABLET | ORAL | Status: DC

## 2014-04-03 NOTE — Telephone Encounter (Signed)
New refill sent to [facility]

## 2014-04-03 NOTE — Telephone Encounter (Signed)
Pt states she needs a new script:  Metoprolol ER 25 mg.  Says mail order [facility].

## 2014-04-18 ENCOUNTER — Other Ambulatory Visit: Payer: Self-pay | Admitting: Internal Medicine

## 2014-04-18 MED ORDER — ALPRAZOLAM 0.5 MG PO TABS
ORAL_TABLET | ORAL | Status: DC
Start: 2014-04-18 — End: 2014-04-24

## 2014-04-18 NOTE — Telephone Encounter (Signed)
ALPRAZolam (XANAX) 0.5 MG tablet  °

## 2014-04-18 NOTE — Telephone Encounter (Signed)
Refilled xanax #45 with one refill 

## 2014-04-18 NOTE — Telephone Encounter (Signed)
Last visit 02/16/14, refill?

## 2014-04-18 NOTE — Telephone Encounter (Signed)
Rx faxed to pharmacy  

## 2014-04-24 ENCOUNTER — Other Ambulatory Visit: Payer: Self-pay | Admitting: *Deleted

## 2014-04-24 MED ORDER — ALPRAZOLAM 0.5 MG PO TABS: ORAL_TABLET | ORAL | Status: DC

## 2014-04-27 ENCOUNTER — Encounter: Payer: Self-pay | Admitting: Internal Medicine

## 2014-06-29 ENCOUNTER — Telehealth: Payer: Self-pay | Admitting: *Deleted

## 2014-06-29 MED ORDER — ALPRAZOLAM 0.5 MG PO TABS: ORAL_TABLET | ORAL | Status: DC

## 2014-06-29 NOTE — Telephone Encounter (Signed)
rx called in as requested

## 2014-06-29 NOTE — Telephone Encounter (Signed)
Printed out rx for alprazolam #45 with one refill.  Since I am not in the office, will need to call in.  Thanks.

## 2014-06-29 NOTE — Telephone Encounter (Signed)
Pt called requesting Alprazolam 0.5mg  tab #45; last refill 6.30.15, last OV 3.20.15, next OV 9.25.15.  Please advise refill.

## 2014-08-24 ENCOUNTER — Encounter: Payer: Self-pay | Admitting: Internal Medicine

## 2014-08-24 ENCOUNTER — Ambulatory Visit: Payer: Medicare Other | Admitting: Internal Medicine

## 2014-08-24 ENCOUNTER — Ambulatory Visit (INDEPENDENT_AMBULATORY_CARE_PROVIDER_SITE_OTHER): Payer: Medicare Other | Admitting: Internal Medicine

## 2014-08-24 VITALS — BP 120/60 | HR 60 | Temp 98.2°F | Resp 14 | Ht 62.0 in | Wt 182.8 lb

## 2014-08-24 DIAGNOSIS — F411 Generalized anxiety disorder: Secondary | ICD-10-CM | POA: Diagnosis not present

## 2014-08-24 DIAGNOSIS — G629 Polyneuropathy, unspecified: Secondary | ICD-10-CM

## 2014-08-24 DIAGNOSIS — H539 Unspecified visual disturbance: Secondary | ICD-10-CM

## 2014-08-24 DIAGNOSIS — F419 Anxiety disorder, unspecified: Secondary | ICD-10-CM

## 2014-08-24 DIAGNOSIS — M549 Dorsalgia, unspecified: Secondary | ICD-10-CM | POA: Diagnosis not present

## 2014-08-24 DIAGNOSIS — I10 Essential (primary) hypertension: Secondary | ICD-10-CM | POA: Diagnosis not present

## 2014-08-24 DIAGNOSIS — G609 Hereditary and idiopathic neuropathy, unspecified: Secondary | ICD-10-CM

## 2014-08-24 DIAGNOSIS — G8929 Other chronic pain: Secondary | ICD-10-CM

## 2014-08-24 DIAGNOSIS — E7800 Pure hypercholesterolemia, unspecified: Secondary | ICD-10-CM

## 2014-08-24 MED ORDER — ALPRAZOLAM 0.5 MG PO TABS: ORAL_TABLET | ORAL | Status: DC

## 2014-08-24 MED ORDER — METOPROLOL SUCCINATE ER 25 MG PO TB24: ORAL_TABLET | ORAL | Status: DC

## 2014-08-24 NOTE — Progress Notes (Signed)
Pre visit review using our clinic review tool, if applicable. No additional management support is needed unless otherwise documented below in the visit note. 

## 2014-08-26 ENCOUNTER — Encounter: Payer: Self-pay | Admitting: Internal Medicine

## 2014-08-26 NOTE — Assessment & Plan Note (Addendum)
Saw Dr Tobe Sos.  Checked out fine.  Declined any further w/up.

## 2014-08-26 NOTE — Assessment & Plan Note (Signed)
Nerve conduction studies appear to be c/w neuropathy.  Have discussed treatment options.  She declines.  Wants to monitor.

## 2014-08-26 NOTE — Assessment & Plan Note (Signed)
Overall stable.  Takes xanax.  Follow.  We discussed other treatment options.  States she has tried other medications and did not tolerate.  Feels the xanax works well for her.  Follow.

## 2014-08-26 NOTE — Assessment & Plan Note (Signed)
On Crestor.  Low cholesterol diet and exercise as tolerated.  Follow.

## 2014-08-26 NOTE — Assessment & Plan Note (Signed)
Blood pressure as outlined.  Same medication regimen.  Follow. Follow metabolic panel.

## 2014-08-26 NOTE — Assessment & Plan Note (Signed)
Desires no further intervention at this time.  Follow  

## 2014-08-26 NOTE — Progress Notes (Signed)
  Subjective:    [name], female    DOB: 1930-11-21, 80 y.o.   MRN: [id number]  HPI 80 year old female with past medical history of hypercholesterolemia, degenerative disc disease, back pain, hypertension and increased anxiety.  She comes in today for a scheduled follow up.  She states she is doing relatively well.  Her husband passed away recently.  Some increased stress related to this.  Doing better.   She recently had some issues with increased heart rate and blood pressure variations.  Better now.  Has seen Dr Ubaldo Glassing.  Had ECHO.  States everything checked out fine. Overall she feels things are stable.     Past Medical History  Diagnosis Date  . Hyperlipidemia   . Degenerative disc disease   . Anxiety   . Depression   . GERD (gastroesophageal reflux disease)     Current Outpatient Prescriptions on File Prior to Visit  Medication Sig Dispense Refill  . aspirin 81 MG tablet Take 81 mg by mouth daily.      . Multiple Vitamin (MULTIVITAMIN) tablet Take 1 tablet by mouth daily.      . RABEprazole (ACIPHEX) 20 MG tablet Take 1 tablet (20 mg total) by mouth daily.  90 tablet  3  . traMADol (ULTRAM) 50 MG tablet Take 1 tablet by mouth twice a day as needed for pain      . vitamin C (ASCORBIC ACID) 250 MG tablet Take 250 mg by mouth daily.      . vitamin E 400 UNIT capsule Take 400 Units by mouth daily.       No current facility-administered medications on file prior to visit.    Review of Systems Patient denies any headache, lightheadedness or dizziness.  No further vision changes.   No significant sinus or allergy symptoms.  No chest pain, tightness.  Some previous increased heart rate and palpitations.  Better now.   No increased shortness of breath, cough or congestion.  No nausea or vomiting.  No acid reflux.  No abdominal pain or cramping.  No bowel change, such as diarrhea, constipation, BRBPR or melana.  No urine change.    Overall feels she is doing relatively well.   Handling stress.        Objective:   Physical Exam  Filed Vitals:   08/24/14 1528  BP: 120/60  Pulse: 60  Temp: 98.2 F (36.8 C)  Resp: 14   Blood pressure recheck:  73/39  80 year old female in no acute distress.   HEENT:  Nares- clear.  Oropharynx - without lesions. NECK:  Supple.  Nontender.  No audible bruit.  HEART:  Appears to be regular. LUNGS:  No crackles or wheezing audible.  Respirations even and unlabored.  RADIAL PULSE:  Equal bilaterally.   ABDOMEN:  Soft, nontender.  Bowel sounds present and normal.  No audible abdominal bruit.    EXTREMITIES:  No increased edema present.  DP pulses palpable and equal bilaterally.      Assessment & Plan:  CARDIOVASCULAR.  Stable.   HEALTH MAINTENANCE.  Physical 05/04/13.  Mammogram 06/29/13 - Birads II.  Declines colonoscopy.   I spent 25 minutes with the patient and more than 50% of the time was spent in consultation regarding the above.

## 2014-08-31 ENCOUNTER — Other Ambulatory Visit: Payer: Medicare Other

## 2014-09-07 ENCOUNTER — Other Ambulatory Visit (INDEPENDENT_AMBULATORY_CARE_PROVIDER_SITE_OTHER): Payer: Medicare Other

## 2014-09-07 DIAGNOSIS — E7800 Pure hypercholesterolemia, unspecified: Secondary | ICD-10-CM

## 2014-09-07 DIAGNOSIS — I10 Essential (primary) hypertension: Secondary | ICD-10-CM | POA: Diagnosis not present

## 2014-09-07 DIAGNOSIS — G629 Polyneuropathy, unspecified: Secondary | ICD-10-CM | POA: Diagnosis not present

## 2014-09-07 LAB — CBC WITH DIFFERENTIAL/PLATELET
Basophils Absolute: 0 10*3/uL (ref 0.0–0.1)
Basophils Relative: 0.6 % (ref 0.0–3.0)
Eosinophils Absolute: 0.1 10*3/uL (ref 0.0–0.7)
Eosinophils Relative: 2 % (ref 0.0–5.0)
HCT: 43.4 % (ref 36.0–46.0)
HEMOGLOBIN: 14.2 g/dL (ref 12.0–15.0)
LYMPHS PCT: 33 % (ref 12.0–46.0)
Lymphs Abs: 1.6 10*3/uL (ref 0.7–4.0)
MCHC: 32.8 g/dL (ref 30.0–36.0)
MCV: 91.5 fl (ref 78.0–100.0)
MONOS PCT: 14.1 % — AB (ref 3.0–12.0)
Monocytes Absolute: 0.7 10*3/uL (ref 0.1–1.0)
Neutro Abs: 2.4 10*3/uL (ref 1.4–7.7)
Neutrophils Relative %: 50.3 % (ref 43.0–77.0)
Platelets: 291 10*3/uL (ref 150.0–400.0)
RBC: 4.74 Mil/uL (ref 3.87–5.11)
RDW: 13.8 % (ref 11.5–15.5)
WBC: 4.7 10*3/uL (ref 4.0–10.5)

## 2014-09-07 LAB — TSH: TSH: 1.29 u[IU]/mL (ref 0.35–4.50)

## 2014-09-07 LAB — LIPID PANEL
Cholesterol: 216 mg/dL — ABNORMAL HIGH (ref 0–200)
HDL: 40.2 mg/dL (ref >39.00)
LDL Cholesterol: 151 mg/dL — ABNORMAL HIGH (ref 0–99)
NonHDL: 175.8
Total CHOL/HDL Ratio: 5
Triglycerides: 126 mg/dL (ref 0.0–149.0)
VLDL: 25.2 mg/dL (ref 0.0–40.0)

## 2014-09-07 LAB — COMPREHENSIVE METABOLIC PANEL
ALT: 25 U/L (ref 0–35)
AST: 26 U/L (ref 0–37)
Albumin: 3.4 g/dL — ABNORMAL LOW (ref 3.5–5.2)
Alkaline Phosphatase: 56 U/L (ref 39–117)
BUN: 16 mg/dL (ref 6–23)
CALCIUM: 8.9 mg/dL (ref 8.4–10.5)
CHLORIDE: 102 meq/L (ref 96–112)
CO2: 29 meq/L (ref 19–32)
Creatinine, Ser: 0.7 mg/dL (ref 0.4–1.2)
GFR: 79.74 mL/min (ref >60.00)
Glucose, Bld: 103 mg/dL — ABNORMAL HIGH (ref 70–99)
Potassium: 4.1 mEq/L (ref 3.5–5.1)
SODIUM: 135 meq/L (ref 135–145)
Total Bilirubin: 0.6 mg/dL (ref 0.2–1.2)
Total Protein: 7.1 g/dL (ref 6.0–8.3)

## 2014-09-21 ENCOUNTER — Ambulatory Visit: Payer: Self-pay | Admitting: Internal Medicine

## 2014-09-21 ENCOUNTER — Encounter: Payer: Self-pay | Admitting: *Deleted

## 2014-09-21 DIAGNOSIS — Z1231 Encounter for screening mammogram for malignant neoplasm of breast: Secondary | ICD-10-CM | POA: Diagnosis not present

## 2014-09-21 LAB — HM MAMMOGRAPHY: HM Mammogram: NEGATIVE

## 2014-10-16 ENCOUNTER — Encounter: Payer: Self-pay | Admitting: Internal Medicine

## 2014-11-01 ENCOUNTER — Other Ambulatory Visit: Payer: Self-pay | Admitting: Internal Medicine

## 2014-11-01 NOTE — Telephone Encounter (Signed)
Last OV and refill 9.25.15.  Please advise refill

## 2014-11-01 NOTE — Telephone Encounter (Signed)
rx faxed

## 2014-11-01 NOTE — Telephone Encounter (Signed)
Refilled xanax #45 with one refill

## 2014-12-20 ENCOUNTER — Other Ambulatory Visit: Payer: Self-pay

## 2014-12-20 MED ORDER — METOPROLOL SUCCINATE ER 25 MG PO TB24: ORAL_TABLET | ORAL | Status: DC

## 2014-12-20 NOTE — Telephone Encounter (Signed)
The pt called and is hoping to get a refill of her [facility]oprolol [facility] [place] (due to new insurance)

## 2014-12-20 NOTE — Telephone Encounter (Signed)
Rx sent to pharmacy by escript  

## 2014-12-26 ENCOUNTER — Telehealth: Payer: Self-pay | Admitting: Internal Medicine

## 2014-12-26 ENCOUNTER — Other Ambulatory Visit: Payer: Self-pay | Admitting: *Deleted

## 2014-12-26 MED ORDER — METOPROLOL SUCCINATE ER 25 MG PO TB24: ORAL_TABLET | ORAL | Status: DC

## 2014-12-26 NOTE — Telephone Encounter (Signed)
done

## 2014-12-26 NOTE — Telephone Encounter (Signed)
Needing to be sent to her mail order . [facility] but she is wanting it moved to her mail order company.  metoprolol succinate (TOPROL-XL) 25 MG 24 hr tablet

## 2015-01-02 ENCOUNTER — Other Ambulatory Visit: Payer: Self-pay | Admitting: Internal Medicine

## 2015-01-02 NOTE — Telephone Encounter (Signed)
Refilled alprazolam #45 with one refill

## 2015-01-02 NOTE — Telephone Encounter (Signed)
rx faxed

## 2015-01-02 NOTE — Telephone Encounter (Signed)
Last refill 9.25.15, last OV 1.4.16.  Please advise refill

## 2015-02-19 ENCOUNTER — Telehealth: Payer: Self-pay | Admitting: Internal Medicine

## 2015-02-19 NOTE — Telephone Encounter (Signed)
Left patient a message on her voicemail. I am not sure who called her.

## 2015-02-19 NOTE — Telephone Encounter (Signed)
Patient is returning your call.  

## 2015-02-22 ENCOUNTER — Encounter: Payer: Medicare Other | Admitting: Internal Medicine

## 2015-03-04 ENCOUNTER — Other Ambulatory Visit: Payer: Self-pay | Admitting: Internal Medicine

## 2015-03-04 NOTE — Telephone Encounter (Signed)
Last OV 9.25.15, last refill 3.4.16.  Please advise refill

## 2015-03-05 NOTE — Telephone Encounter (Signed)
ok'd rx for alprazolam #45 with 1 refill.

## 2015-03-05 NOTE — Telephone Encounter (Signed)
Faxed to pharmacy

## 2015-03-07 ENCOUNTER — Ambulatory Visit (INDEPENDENT_AMBULATORY_CARE_PROVIDER_SITE_OTHER): Payer: Medicare Other | Admitting: Internal Medicine

## 2015-03-07 ENCOUNTER — Encounter: Payer: Self-pay | Admitting: Internal Medicine

## 2015-03-07 VITALS — BP 118/72 | HR 62 | Temp 98.3°F | Ht 62.0 in | Wt 184.4 lb

## 2015-03-07 DIAGNOSIS — F419 Anxiety disorder, unspecified: Secondary | ICD-10-CM | POA: Diagnosis not present

## 2015-03-07 DIAGNOSIS — I10 Essential (primary) hypertension: Secondary | ICD-10-CM | POA: Diagnosis not present

## 2015-03-07 DIAGNOSIS — Z0000 Encounter for general adult medical examination without abnormal findings: Secondary | ICD-10-CM

## 2015-03-07 DIAGNOSIS — G8929 Other chronic pain: Secondary | ICD-10-CM

## 2015-03-07 DIAGNOSIS — G629 Polyneuropathy, unspecified: Secondary | ICD-10-CM | POA: Diagnosis not present

## 2015-03-07 DIAGNOSIS — E7800 Pure hypercholesterolemia, unspecified: Secondary | ICD-10-CM

## 2015-03-07 DIAGNOSIS — M549 Dorsalgia, unspecified: Secondary | ICD-10-CM

## 2015-03-07 NOTE — Progress Notes (Signed)
[name], female   DOB: 04-05-1930, 81 y.o.   MRN: [id number]   Subjective:    [name], female    DOB: 12/12/1929, 81 y.o.   MRN: [id number]  HPI  Patient here for her physical exam.  Doing well.  Feels good.  Tries to stay active.  No cardiac symptoms with increased activity or exertion. Breathing stable.  Takes xanax.  Working well for her.  Stable.  Back stable.  Neuropathy better.     Past Medical History  Diagnosis Date  . Hyperlipidemia   . Degenerative disc disease   . Anxiety   . Depression   . GERD (gastroesophageal reflux disease)     Current Outpatient Prescriptions on File Prior to Visit  Medication Sig Dispense Refill  . Alpha-Lipoic Acid 300 MG TABS Take 1 tablet by mouth daily.    Marland Kitchen ALPRAZolam (XANAX) 0.5 MG tablet TAKE 1 TABLET BY MOUTH AT BEDTIME AND 1/2 TABLET DURING THE DAY AS NEEDED 45 tablet 1  . aspirin 81 MG tablet Take 81 mg by mouth daily.    . metoprolol succinate (TOPROL-XL) 25 MG 24 hr tablet Take 1/2 tablets by mouth twice a day 90 tablet 1  . Multiple Vitamin (MULTIVITAMIN) tablet Take 1 tablet by mouth daily.    . RABEprazole (ACIPHEX) 20 MG tablet Take 1 tablet (20 mg total) by mouth daily. 90 tablet 3  . traMADol (ULTRAM) 50 MG tablet Take 1 tablet by mouth twice a day as needed for pain    . vitamin C (ASCORBIC ACID) 250 MG tablet Take 250 mg by mouth daily.    . vitamin E 400 UNIT capsule Take 400 Units by mouth daily.     No current facility-administered medications on file prior to visit.    Review of Systems  Constitutional: Negative for appetite change and unexpected weight change.  HENT: Negative for congestion and sinus pressure.   Eyes: Negative for pain and visual disturbance.  Respiratory: Negative for cough, chest tightness and shortness of breath.   Cardiovascular: Negative for chest pain, palpitations and leg swelling.  Gastrointestinal: Positive for constipation. Negative for nausea, vomiting,  abdominal pain and diarrhea.  Genitourinary: Negative for dysuria and difficulty urinating.  Musculoskeletal: Negative for joint swelling and neck stiffness.  Skin: Negative for color change and rash.  Neurological: Negative for dizziness, light-headedness and headaches.  Hematological: Negative for adenopathy. Does not bruise/bleed easily.  Psychiatric/Behavioral: Negative for dysphoric mood and agitation (doing well on current regimen. ).       Objective:    Physical Exam  Constitutional: She is oriented to person, place, and time. She appears well-developed and well-nourished.  HENT:  Nose: Nose normal.  Mouth/Throat: Oropharynx is clear and moist.  Eyes: Right eye exhibits no discharge. Left eye exhibits no discharge. No scleral icterus.  Neck: Neck supple. No thyromegaly present.  Cardiovascular: Normal rate and regular rhythm.   Pulmonary/Chest: Breath sounds normal. No accessory muscle usage. No tachypnea. No respiratory distress. She has no decreased breath sounds. She has no wheezes. She has no rhonchi. Right breast exhibits no inverted nipple, no mass, no nipple discharge and no tenderness (no axillary adenopathy). Left breast exhibits no inverted nipple, no mass, no nipple discharge and no tenderness (no axilarry adenopathy).  Abdominal: Soft. Bowel sounds are normal. There is no tenderness.  Musculoskeletal: She exhibits no edema or tenderness.  Lymphadenopathy:    She has no cervical adenopathy.  Neurological: She is alert  and oriented to person, place, and time.  Skin: Skin is warm. No rash noted.  Psychiatric: She has a normal mood and affect. Her behavior is normal.    BP 118/72 mmHg  Pulse 62  Temp(Src) 98.3 F (36.8 C) (Oral)  Ht 5\' 2"  (1.575 m)  Wt 184 lb 6 oz (83.632 kg)  BMI 33.71 kg/m2  SpO2 95% Wt Readings from Last 3 Encounters:  03/07/15 184 lb 6 oz (83.632 kg)  08/24/14 182 lb 12 oz (82.895 kg)  02/16/14 185 lb 8 oz (84.142 kg)     Lab Results    Component Value Date   WBC 4.7 09/07/2014   HGB 14.2 09/07/2014   HCT 43.4 09/07/2014   PLT 291.0 09/07/2014   GLUCOSE 103* 09/07/2014   CHOL 216* 09/07/2014   TRIG 126.0 09/07/2014   HDL 40.20 09/07/2014   LDLCALC 151* 09/07/2014   ALT 25 09/07/2014   AST 26 09/07/2014   NA 135 09/07/2014   K 4.1 09/07/2014   CL 102 09/07/2014   CREATININE 0.7 09/07/2014   BUN 16 09/07/2014   CO2 29 09/07/2014   TSH 1.29 09/07/2014       Assessment & Plan:   Problem List Items Addressed This Visit    Anxiety    Doing well on her current regimen.  Follow.       Chronic back pain    Stable.  Follow.  Stay active.       Essential hypertension, benign - Primary    Blood pressure doing well.  Same medication regimen.  Follow pressures.  Follow metabolic panel       Health care maintenance    Physical 03/07/15.  Mammogram 09/21/14 - Birads I.  Declines colonoscopy.       Hypercholesterolemia    Low cholesterol diet and exercise.  Follow lipid panel.       Peripheral neuropathy    Doing better.  Taking vitamin C.  Follow.            Einar Pheasant, MD

## 2015-03-07 NOTE — Progress Notes (Signed)
Pre visit review using our clinic review tool, if applicable. No additional management support is needed unless otherwise documented below in the visit note. 

## 2015-03-10 ENCOUNTER — Encounter: Payer: Self-pay | Admitting: Internal Medicine

## 2015-03-10 DIAGNOSIS — Z0000 Encounter for general adult medical examination without abnormal findings: Secondary | ICD-10-CM | POA: Insufficient documentation

## 2015-03-10 NOTE — Assessment & Plan Note (Signed)
Blood pressure doing well.  Same medication regimen.  Follow pressures.  Follow metabolic panel.   

## 2015-03-10 NOTE — Assessment & Plan Note (Signed)
Doing better.  Taking vitamin C.  Follow.

## 2015-03-10 NOTE — Assessment & Plan Note (Signed)
Low cholesterol diet and exercise.  Follow lipid panel.   

## 2015-03-10 NOTE — Assessment & Plan Note (Signed)
Doing well on her current regimen.  Follow.

## 2015-03-10 NOTE — Assessment & Plan Note (Signed)
Physical 03/07/15.  Mammogram 09/21/14 - Birads I.  Declines colonoscopy.

## 2015-03-10 NOTE — Assessment & Plan Note (Signed)
Stable.  Follow.  Stay active.

## 2015-03-15 DIAGNOSIS — H2511 Age-related nuclear cataract, right eye: Secondary | ICD-10-CM | POA: Diagnosis not present

## 2015-03-24 NOTE — Op Note (Signed)
PATIENT NAME:  Sandra Reyes, Sandra Reyes MR#:  [id number] DATE OF BIRTH:  [birth year]  DATE OF PROCEDURE:  12/07/2011  PREOPERATIVE DIAGNOSIS:  Cataract, left eye.    POSTOPERATIVE DIAGNOSIS:  Cataract, left eye.  PROCEDURE PERFORMED:  Extracapsular cataract extraction using phacoemulsification with placement of an Alcon [id number], 22.0-diopter posterior chamber lens, serial [id number].  SURGEON:  Loura Back. Joran Kallal, MD  ASSISTANT:  None.  ANESTHESIA:  4% lidocaine and 0.75% Marcaine in a 50/50 mixture with 10 units/mL of Vitrase added, given as a peribulbar.   ANESTHESIOLOGIST:  Lucilla Edin, MD  COMPLICATIONS:  None.  ESTIMATED BLOOD LOSS:  Less than 1 ml.  DESCRIPTION OF PROCEDURE:  The patient was brought to the operating room and given a peribulbar block.  The patient was then prepped and draped in the usual fashion.  The vertical rectus muscles were imbricated using 5-0 silk sutures.  These sutures were then clamped to the sterile drapes as bridle sutures.  A limbal peritomy was performed extending two clock hours and hemostasis was obtained with cautery.  A partial thickness scleral groove was made at the surgical limbus and dissected anteriorly in a lamellar dissection using an Alcon crescent knife.  The anterior chamber was entered supero-temporally with a Superblade and through the lamellar dissection with a 2.6 mm keratome.  DisCoVisc was used to replace the aqueous and a continuous tear capsulorrhexis was carried out.  Hydrodissection and hydrodelineation were carried out with balanced salt and a 27 gauge canula.  The nucleus was rotated to confirm the effectiveness of the hydrodissection.  Phacoemulsification was carried out using a divide-and-conquer technique.  Total ultrasound time was 2 minutes and 12 seconds with an average power of 23.1 percent.  Irrigation/aspiration was used to remove the residual cortex.  DisCoVisc was used to inflate the capsule and the internal incision was  enlarged to 3 mm with the crescent knife.  The intraocular lens was folded and inserted into the capsular bag using the AcrySert delivery system.  Irrigation/aspiration was used to remove the residual DisCoVisc.  Miostat was injected into the anterior chamber through the paracentesis track to inflate the anterior chamber and induce miosis.  The wound was checked for leaks and none were found. The conjunctiva was closed with cautery and the bridle sutures were removed.  Two drops of 0.3% Vigamox were placed on the eye.   An eye shield was placed on the eye.  The patient was discharged to the recovery room in good condition. ____________________________ Loura Back Clatie Kessen, MD sad:slb D: 12/07/2011 12:50:57 ET T: 12/07/2011 13:31:30 ET JOB#: [id number]  cc: Remo Lipps A. Dennis Hegeman, MD, <Dictator> Martie Lee MD ELECTRONICALLY SIGNED 12/14/2011 13:50

## 2015-05-06 ENCOUNTER — Telehealth: Payer: Self-pay | Admitting: Internal Medicine

## 2015-05-06 NOTE — Telephone Encounter (Signed)
Pt needs refill on Xanax. Please advise pt/msn

## 2015-05-06 NOTE — Telephone Encounter (Signed)
Patient requesting refill on Xanax.  Last office visit was 03/07/15.  Please advise?

## 2015-05-07 ENCOUNTER — Other Ambulatory Visit: Payer: Self-pay | Admitting: Internal Medicine

## 2015-05-07 MED ORDER — ALPRAZOLAM 0.5 MG PO TABS: ORAL_TABLET | ORAL | Status: DC

## 2015-05-07 NOTE — Telephone Encounter (Signed)
Refill ok'd for xanax #45 with one refill.  Needs to schedule a f/u appt in 08/2015.  Apparently not scheduled when she left after last appt.  Thanks.

## 2015-05-07 NOTE — Telephone Encounter (Signed)
Last OV 4.7.16. Please advise on refill

## 2015-05-07 NOTE — Telephone Encounter (Signed)
Just ok'd rx for #45 with one refill (alprazolam).

## 2015-05-08 NOTE — Telephone Encounter (Signed)
Rx faxed

## 2015-05-08 NOTE — Telephone Encounter (Signed)
Thanks, Called patient and scheduled appointment for Oct. 6th at 11am.

## 2015-05-30 ENCOUNTER — Telehealth: Payer: Self-pay | Admitting: Internal Medicine

## 2015-05-30 DIAGNOSIS — E7800 Pure hypercholesterolemia, unspecified: Secondary | ICD-10-CM

## 2015-05-30 DIAGNOSIS — I10 Essential (primary) hypertension: Secondary | ICD-10-CM

## 2015-05-30 NOTE — Telephone Encounter (Signed)
Orders placed for labs

## 2015-05-30 NOTE — Telephone Encounter (Signed)
The patient stated at her last visit that the physician requested that she return to have lab work done. Patient needing lab orders in the system before scheduling.

## 2015-05-31 ENCOUNTER — Other Ambulatory Visit (INDEPENDENT_AMBULATORY_CARE_PROVIDER_SITE_OTHER): Payer: Medicare Other

## 2015-05-31 DIAGNOSIS — E7800 Pure hypercholesterolemia, unspecified: Secondary | ICD-10-CM

## 2015-05-31 DIAGNOSIS — I10 Essential (primary) hypertension: Secondary | ICD-10-CM | POA: Diagnosis not present

## 2015-05-31 LAB — LIPID PANEL
Cholesterol: 226 mg/dL — ABNORMAL HIGH (ref 0–200)
HDL: 43.9 mg/dL (ref >39.00)
LDL Cholesterol: 159 mg/dL — ABNORMAL HIGH (ref 0–99)
NonHDL: 182.1
Total CHOL/HDL Ratio: 5
Triglycerides: 118 mg/dL (ref 0.0–149.0)
VLDL: 23.6 mg/dL (ref 0.0–40.0)

## 2015-05-31 LAB — COMPREHENSIVE METABOLIC PANEL
ALT: 14 U/L (ref 0–35)
AST: 20 U/L (ref 0–37)
Albumin: 3.8 g/dL (ref 3.5–5.2)
Alkaline Phosphatase: 63 U/L (ref 39–117)
BILIRUBIN TOTAL: 0.6 mg/dL (ref 0.2–1.2)
BUN: 15 mg/dL (ref 6–23)
CHLORIDE: 102 meq/L (ref 96–112)
CO2: 27 mEq/L (ref 19–32)
Calcium: 9.2 mg/dL (ref 8.4–10.5)
Creatinine, Ser: 0.79 mg/dL (ref 0.40–1.20)
GFR: 73.82 mL/min (ref >60.00)
GLUCOSE: 110 mg/dL — AB (ref 70–99)
POTASSIUM: 4.2 meq/L (ref 3.5–5.1)
SODIUM: 137 meq/L (ref 135–145)
Total Protein: 6.9 g/dL (ref 6.0–8.3)

## 2015-06-01 ENCOUNTER — Other Ambulatory Visit: Payer: Self-pay | Admitting: Internal Medicine

## 2015-06-01 DIAGNOSIS — R739 Hyperglycemia, unspecified: Secondary | ICD-10-CM

## 2015-06-01 DIAGNOSIS — E7800 Pure hypercholesterolemia, unspecified: Secondary | ICD-10-CM

## 2015-06-01 NOTE — Progress Notes (Signed)
Orders placed for f/u labs.  

## 2015-06-05 ENCOUNTER — Encounter: Payer: Self-pay | Admitting: *Deleted

## 2015-06-11 DIAGNOSIS — H15102 Unspecified episcleritis, left eye: Secondary | ICD-10-CM | POA: Diagnosis not present

## 2015-06-14 DIAGNOSIS — H15102 Unspecified episcleritis, left eye: Secondary | ICD-10-CM | POA: Diagnosis not present

## 2015-07-03 ENCOUNTER — Other Ambulatory Visit: Payer: Self-pay | Admitting: Internal Medicine

## 2015-07-03 NOTE — Telephone Encounter (Signed)
ok'd refill for alprazolam #45 with one refill.

## 2015-07-03 NOTE — Telephone Encounter (Signed)
Last OV 4.7.16, last refill 7.6.16.  Please advise refill

## 2015-07-04 NOTE — Telephone Encounter (Signed)
rx faxed

## 2015-07-09 ENCOUNTER — Other Ambulatory Visit: Payer: Self-pay | Admitting: *Deleted

## 2015-07-09 MED ORDER — METOPROLOL SUCCINATE ER 25 MG PO TB24: ORAL_TABLET | ORAL | Status: DC

## 2015-07-10 ENCOUNTER — Telehealth: Payer: Self-pay | Admitting: Internal Medicine

## 2015-07-10 NOTE — Telephone Encounter (Signed)
Pt called to check the status of medication refill. Medication is metotrolol-succ tablets. Pharmacy is [facility] fax order. 3 month supply. Thank You!

## 2015-07-10 NOTE — Telephone Encounter (Signed)
rx sent

## 2015-07-11 ENCOUNTER — Other Ambulatory Visit: Payer: Self-pay | Admitting: Internal Medicine

## 2015-07-12 ENCOUNTER — Other Ambulatory Visit: Payer: Medicare Other

## 2015-09-05 ENCOUNTER — Ambulatory Visit (INDEPENDENT_AMBULATORY_CARE_PROVIDER_SITE_OTHER): Payer: Medicare Other | Admitting: Internal Medicine

## 2015-09-05 ENCOUNTER — Encounter: Payer: Self-pay | Admitting: Internal Medicine

## 2015-09-05 VITALS — BP 120/70 | HR 59 | Temp 98.1°F | Resp 18 | Ht 62.0 in | Wt 186.1 lb

## 2015-09-05 DIAGNOSIS — I10 Essential (primary) hypertension: Secondary | ICD-10-CM | POA: Diagnosis not present

## 2015-09-05 DIAGNOSIS — Z1239 Encounter for other screening for malignant neoplasm of breast: Secondary | ICD-10-CM

## 2015-09-05 DIAGNOSIS — E7800 Pure hypercholesterolemia, unspecified: Secondary | ICD-10-CM

## 2015-09-05 DIAGNOSIS — G6289 Other specified polyneuropathies: Secondary | ICD-10-CM | POA: Diagnosis not present

## 2015-09-05 DIAGNOSIS — F419 Anxiety disorder, unspecified: Secondary | ICD-10-CM | POA: Diagnosis not present

## 2015-09-05 DIAGNOSIS — R739 Hyperglycemia, unspecified: Secondary | ICD-10-CM

## 2015-09-05 DIAGNOSIS — M549 Dorsalgia, unspecified: Secondary | ICD-10-CM | POA: Diagnosis not present

## 2015-09-05 DIAGNOSIS — K219 Gastro-esophageal reflux disease without esophagitis: Secondary | ICD-10-CM

## 2015-09-05 DIAGNOSIS — G8929 Other chronic pain: Secondary | ICD-10-CM

## 2015-09-05 MED ORDER — ALPRAZOLAM 0.5 MG PO TABS: ORAL_TABLET | ORAL | Status: DC

## 2015-09-05 MED ORDER — METOPROLOL SUCCINATE ER 25 MG PO TB24: ORAL_TABLET | ORAL | Status: DC

## 2015-09-05 NOTE — Progress Notes (Signed)
Pre-visit discussion using our clinic review tool. No additional management support is needed unless otherwise documented below in the visit note.  

## 2015-09-05 NOTE — Progress Notes (Signed)
[name], female   DOB: 03/30/1930, 81 y.o.   MRN: [id number]   Subjective:    [name], female    DOB: [birth year], 81 y.o.   MRN: [id number]  HPI  Patient with past history of hypertension, peripheral neuropathy, anxiety and hypercholesterolemia.  She comes in today to follow up on these issues.  She reports some increased pain in her right fourth and fifth finger.  Desires no further w/up.  Tries to stay active.  No cardiac symptoms with increased activity or exertion.  No sob.  No acid reflux reported. States she stopped aciphex and has not had issues since stopping.  No abdominal pain or cramping.  Bowels stable.  Handling stress relatively well.     Past Medical History  Diagnosis Date  . Hyperlipidemia   . Degenerative disc disease   . Anxiety   . Depression   . GERD (gastroesophageal reflux disease)    Past Surgical History  Procedure Laterality Date  . Rectocele repair    . Cystocele repair    . Breast surgery  1960 to 1970    lumps removed  . Abdominal hysterectomy  1997    partial   Family History  Problem Relation Age of Onset  . Heart disease Mother   . Arthritis Mother   . Ulcers Father     uremic poisoning  . Arthritis Sister     x 4  . Hyperlipidemia Sister     x 1  . Hypertension Sister   . Cancer Sister     x 2 (ovarian cancer) & 1 (colon cancer)  . Diabetes Sister    Social History   Social History  . Marital Status: Married    Spouse Name: N/A  . Number of Children: N/A  . Years of Education: N/A   Social History Main Topics  . Smoking status: Never Smoker   . Smokeless tobacco: Never Used  . Alcohol Use: No  . Drug Use: No  . Sexual Activity: Not Asked   Other Topics Concern  . None   Social History Narrative    Outpatient Encounter Prescriptions as of 09/05/2015  Medication Sig  . Alpha-Lipoic Acid 300 MG TABS Take 1 tablet by mouth daily.  Marland Kitchen ALPRAZolam (XANAX) 0.5 MG tablet TAKE 1 TABLET BY MOUTH AT  BEDTIME AND 1/2 TABLET DURING THE DAY  . aspirin 81 MG tablet Take 81 mg by mouth daily.  . metoprolol succinate (TOPROL-XL) 25 MG 24 hr tablet Take one-half tablet by  mouth twice a day  . Multiple Vitamin (MULTIVITAMIN) tablet Take 1 tablet by mouth daily.  . RABEprazole (ACIPHEX) 20 MG tablet Take 1 tablet (20 mg total) by mouth daily.  . vitamin C (ASCORBIC ACID) 250 MG tablet Take 250 mg by mouth daily.  . vitamin E 400 UNIT capsule Take 400 Units by mouth daily.  . [DISCONTINUED] ALPRAZolam (XANAX) 0.5 MG tablet TAKE 1 TABLET BY MOUTH AT BEDTIME AND 1/2 TABLET DURING THE DAY  . [DISCONTINUED] metoprolol succinate (TOPROL-XL) 25 MG 24 hr tablet Take one-half tablet by  mouth twice a day  . [DISCONTINUED] traMADol (ULTRAM) 50 MG tablet Take 1 tablet by mouth twice a day as needed for pain   No facility-administered encounter medications on file as of 09/05/2015.    Review of Systems  Constitutional: Negative for appetite change and unexpected weight change.  HENT: Negative for congestion and sinus pressure.   Eyes: Negative for pain and  visual disturbance.  Respiratory: Negative for cough, chest tightness and shortness of breath.   Cardiovascular: Negative for chest pain, palpitations and leg swelling.  Gastrointestinal: Negative for nausea, vomiting, abdominal pain and diarrhea.  Genitourinary: Negative for dysuria and difficulty urinating.  Musculoskeletal:       Right fourth and fifth finger discomfort as outlined.    Skin: Negative for color change and rash.  Neurological: Negative for dizziness, light-headedness and headaches.  Psychiatric/Behavioral: Negative for dysphoric mood and agitation.       Objective:     Blood pressure rechecked by me:  136/78  Physical Exam  Constitutional: She appears well-developed and well-nourished. No distress.  HENT:  Nose: Nose normal.  Mouth/Throat: Oropharynx is clear and moist.  Eyes: Conjunctivae are normal. Right eye exhibits no  discharge. Left eye exhibits no discharge.  Neck: Neck supple. No thyromegaly present.  Cardiovascular: Normal rate and regular rhythm.   Pulmonary/Chest: Breath sounds normal. No respiratory distress. She has no wheezes.  Abdominal: Soft. Bowel sounds are normal. There is no tenderness.  Musculoskeletal: She exhibits no edema or tenderness.  Lymphadenopathy:    She has no cervical adenopathy.  Skin: No rash noted. No erythema.  Psychiatric: She has a normal mood and affect. Her behavior is normal.    BP 120/70 mmHg  Pulse 59  Temp(Src) 98.1 F (36.7 C) (Oral)  Resp 18  Ht '5\' 2"'  (1.575 m)  Wt 186 lb 2 oz (84.426 kg)  BMI 34.03 kg/m2  SpO2 97% Wt Readings from Last 3 Encounters:  09/05/15 186 lb 2 oz (84.426 kg)  03/07/15 184 lb 6 oz (83.632 kg)  08/24/14 182 lb 12 oz (82.895 kg)     Lab Results  Component Value Date   WBC 4.7 09/07/2014   HGB 14.2 09/07/2014   HCT 43.4 09/07/2014   PLT 291.0 09/07/2014   GLUCOSE 110* 05/31/2015   CHOL 226* 05/31/2015   TRIG 118.0 05/31/2015   HDL 43.90 05/31/2015   LDLCALC 159* 05/31/2015   ALT 14 05/31/2015   AST 20 05/31/2015   NA 137 05/31/2015   K 4.2 05/31/2015   CL 102 05/31/2015   CREATININE 0.79 05/31/2015   BUN 15 05/31/2015   CO2 27 05/31/2015   TSH 1.29 09/07/2014       Assessment & Plan:   Problem List Items Addressed This Visit    Anxiety    Doing well on her current regimen.  Follow.        Relevant Medications   ALPRAZolam (XANAX) 0.5 MG tablet   Chronic back pain    Stable.  Stays active.  Follow.        Essential hypertension, benign    Blood pressure under good control.  Continue same medication regimen.  Follow pressures.  Follow metabolic panel.        Relevant Medications   metoprolol succinate (TOPROL-XL) 25 MG 24 hr tablet   Other Relevant Orders   Basic metabolic panel   GERD (gastroesophageal reflux disease)    Off aciphex.  No symptoms.  Follow.        Hypercholesterolemia    Low  cholesterol diet and exercise.  Follow lipid panel.        Relevant Medications   metoprolol succinate (TOPROL-XL) 25 MG 24 hr tablet   Other Relevant Orders   Lipid panel   Hepatic function panel   Hyperglycemia    Low carb diet and exercise.  Follow met b and a1c.  Relevant Orders   Hemoglobin A1c   Peripheral neuropathy (HCC)    Stable.  Follow.       Relevant Medications   ALPRAZolam (XANAX) 0.5 MG tablet   Other Relevant Orders   CBC with Differential/Platelet   TSH    Other Visit Diagnoses    Breast cancer screening    -  Primary    Relevant Orders    MM DIGITAL SCREENING BILATERAL        Einar Pheasant, MD

## 2015-09-08 ENCOUNTER — Encounter: Payer: Self-pay | Admitting: Internal Medicine

## 2015-09-08 DIAGNOSIS — K219 Gastro-esophageal reflux disease without esophagitis: Secondary | ICD-10-CM | POA: Insufficient documentation

## 2015-09-08 DIAGNOSIS — R739 Hyperglycemia, unspecified: Secondary | ICD-10-CM | POA: Insufficient documentation

## 2015-09-08 NOTE — Assessment & Plan Note (Signed)
Low carb diet and exercise.  Follow met b and a1c.  

## 2015-09-08 NOTE — Assessment & Plan Note (Signed)
Doing well on her current regimen.  Follow.  

## 2015-09-08 NOTE — Assessment & Plan Note (Signed)
Stable.  Follow.   

## 2015-09-08 NOTE — Assessment & Plan Note (Signed)
Low cholesterol diet and exercise.  Follow lipid panel.   

## 2015-09-08 NOTE — Assessment & Plan Note (Signed)
Off aciphex.  No symptoms.  Follow.

## 2015-09-08 NOTE — Assessment & Plan Note (Signed)
Blood pressure under good control.  Continue same medication regimen.  Follow pressures.  Follow metabolic panel.   

## 2015-09-08 NOTE — Assessment & Plan Note (Signed)
Stable.  Stays active.  Follow.

## 2015-09-19 ENCOUNTER — Other Ambulatory Visit: Payer: Medicare Other

## 2015-09-20 ENCOUNTER — Other Ambulatory Visit (INDEPENDENT_AMBULATORY_CARE_PROVIDER_SITE_OTHER): Payer: Medicare Other

## 2015-09-20 DIAGNOSIS — E7800 Pure hypercholesterolemia, unspecified: Secondary | ICD-10-CM | POA: Diagnosis not present

## 2015-09-20 DIAGNOSIS — I10 Essential (primary) hypertension: Secondary | ICD-10-CM

## 2015-09-20 DIAGNOSIS — R739 Hyperglycemia, unspecified: Secondary | ICD-10-CM | POA: Diagnosis not present

## 2015-09-20 DIAGNOSIS — G6289 Other specified polyneuropathies: Secondary | ICD-10-CM | POA: Diagnosis not present

## 2015-09-20 LAB — CBC WITH DIFFERENTIAL/PLATELET
BASOS ABS: 0 10*3/uL (ref 0.0–0.1)
Basophils Relative: 0.4 % (ref 0.0–3.0)
EOS PCT: 2.2 % (ref 0.0–5.0)
Eosinophils Absolute: 0.1 10*3/uL (ref 0.0–0.7)
HCT: 44.5 % (ref 36.0–46.0)
HEMOGLOBIN: 14.6 g/dL (ref 12.0–15.0)
Lymphocytes Relative: 27.4 % (ref 12.0–46.0)
Lymphs Abs: 1.4 10*3/uL (ref 0.7–4.0)
MCHC: 32.8 g/dL (ref 30.0–36.0)
MCV: 92.5 fl (ref 78.0–100.0)
MONO ABS: 0.7 10*3/uL (ref 0.1–1.0)
MONOS PCT: 13 % — AB (ref 3.0–12.0)
Neutro Abs: 2.9 10*3/uL (ref 1.4–7.7)
Neutrophils Relative %: 57 % (ref 43.0–77.0)
Platelets: 316 10*3/uL (ref 150.0–400.0)
RBC: 4.81 Mil/uL (ref 3.87–5.11)
RDW: 14.2 % (ref 11.5–15.5)
WBC: 5 10*3/uL (ref 4.0–10.5)

## 2015-09-20 LAB — HEPATIC FUNCTION PANEL
ALBUMIN: 3.8 g/dL (ref 3.5–5.2)
ALT: 15 U/L (ref 0–35)
AST: 22 U/L (ref 0–37)
Alkaline Phosphatase: 61 U/L (ref 39–117)
BILIRUBIN TOTAL: 0.6 mg/dL (ref 0.2–1.2)
Bilirubin, Direct: 0.1 mg/dL (ref 0.0–0.3)
Total Protein: 6.8 g/dL (ref 6.0–8.3)

## 2015-09-20 LAB — BASIC METABOLIC PANEL
BUN: 13 mg/dL (ref 6–23)
CALCIUM: 9.4 mg/dL (ref 8.4–10.5)
CHLORIDE: 101 meq/L (ref 96–112)
CO2: 29 meq/L (ref 19–32)
Creatinine, Ser: 0.76 mg/dL (ref 0.40–1.20)
GFR: 77.13 mL/min (ref >60.00)
GLUCOSE: 105 mg/dL — AB (ref 70–99)
POTASSIUM: 4.6 meq/L (ref 3.5–5.1)
SODIUM: 137 meq/L (ref 135–145)

## 2015-09-20 LAB — LIPID PANEL
Cholesterol: 221 mg/dL — ABNORMAL HIGH (ref 0–200)
HDL: 44.7 mg/dL (ref >39.00)
LDL Cholesterol: 152 mg/dL — ABNORMAL HIGH (ref 0–99)
NONHDL: 176.74
TRIGLYCERIDES: 123 mg/dL (ref 0.0–149.0)
Total CHOL/HDL Ratio: 5
VLDL: 24.6 mg/dL (ref 0.0–40.0)

## 2015-09-20 LAB — TSH: TSH: 1.43 u[IU]/mL (ref 0.35–4.50)

## 2015-09-20 LAB — HEMOGLOBIN A1C: HEMOGLOBIN A1C: 6 % (ref 4.6–6.5)

## 2015-09-21 LAB — GLUCOSE, FASTING: GLUCOSE, FASTING: 101 mg/dL — AB (ref 65–99)

## 2015-09-25 ENCOUNTER — Encounter: Payer: Self-pay | Admitting: *Deleted

## 2015-09-27 ENCOUNTER — Ambulatory Visit
Admission: RE | Admit: 2015-09-27 | Discharge: 2015-09-27 | Disposition: A | Discharge status: Home / Self Care | Payer: Medicare Other | Source: Ambulatory Visit | Attending: Internal Medicine | Admitting: Internal Medicine

## 2015-09-27 DIAGNOSIS — Z1239 Encounter for other screening for malignant neoplasm of breast: Secondary | ICD-10-CM

## 2015-10-04 ENCOUNTER — Ambulatory Visit: Admission: RE | Admit: 2015-10-04 | Payer: Medicare Other | Source: Ambulatory Visit

## 2015-10-31 ENCOUNTER — Other Ambulatory Visit: Payer: Self-pay

## 2015-10-31 ENCOUNTER — Telehealth: Payer: Self-pay | Admitting: Internal Medicine

## 2015-10-31 MED ORDER — ALPRAZOLAM 0.5 MG PO TABS: ORAL_TABLET | ORAL | Status: DC

## 2015-10-31 NOTE — Telephone Encounter (Signed)
ok'd xanax #30 with one refill.  rx signed and placed on your desk.

## 2015-10-31 NOTE — Telephone Encounter (Signed)
Pt is requesting a refill on her Alprazolam (Xanax) 0.5 mg tablet. Will run out by Monday. [facility] [place]   Thank you, Kp

## 2015-10-31 NOTE — Telephone Encounter (Signed)
Please advise refill? 

## 2015-10-31 NOTE — Telephone Encounter (Signed)
Sent to provider for review

## 2015-10-31 NOTE — Telephone Encounter (Signed)
Rx faxed

## 2015-12-25 DIAGNOSIS — R21 Rash and other nonspecific skin eruption: Secondary | ICD-10-CM | POA: Diagnosis not present

## 2015-12-30 ENCOUNTER — Other Ambulatory Visit: Payer: Self-pay

## 2015-12-30 ENCOUNTER — Telehealth: Payer: Self-pay | Admitting: Internal Medicine

## 2015-12-30 MED ORDER — ALPRAZOLAM 0.5 MG PO TABS: ORAL_TABLET | ORAL | Status: DC

## 2015-12-30 NOTE — Telephone Encounter (Signed)
Ok's xanax #45 with one refill.  rx signed.

## 2015-12-30 NOTE — Telephone Encounter (Signed)
faxed

## 2015-12-30 NOTE — Telephone Encounter (Signed)
Pt last OV 09/05/15, last filled 10/31/15 #45tabs 1refill. Please advise

## 2015-12-30 NOTE — Telephone Encounter (Signed)
Pt called needing a refill on medication ALPRAZolam (XANAX) 0.5 MG tablet. [id number] [place], [place] [address]. Call pt @ [phone]. Thank you!

## 2016-01-23 ENCOUNTER — Telehealth: Payer: Self-pay | Admitting: Internal Medicine

## 2016-01-23 NOTE — Telephone Encounter (Signed)
Pt called about needing a new walker pt states that the wheels are bent in and rubbing against the bars and makes it hard to push. Pt states she needs a prescription. Equipment company is pt is not sure. Call pt @ [phone]. Thank you!

## 2016-01-27 NOTE — Telephone Encounter (Signed)
Pt needs a script for a new walker, the pt's wheels are rubbing against the walker making it hard to push. Please advise, thanks

## 2016-01-28 NOTE — Telephone Encounter (Signed)
rx written and placed in your basket.

## 2016-01-28 NOTE — Telephone Encounter (Signed)
Pt notified & rx mailed per her request

## 2016-02-24 ENCOUNTER — Other Ambulatory Visit: Payer: Self-pay | Admitting: Internal Medicine

## 2016-02-24 NOTE — Telephone Encounter (Signed)
Pt called needing a refill for ALPRAZolam (XANAX) 0.5 MG tablet. [id number] [place], [place] [address]. Call pt @ [phone]. Thank you!

## 2016-02-24 NOTE — Telephone Encounter (Signed)
Pt is requesting a refill on Xanax. Pt last filled 12/30/15, pt's last OV was 09/25/15. Please advise, thanks

## 2016-02-25 MED ORDER — ALPRAZOLAM 0.5 MG PO TABS: ORAL_TABLET | ORAL | Status: DC

## 2016-02-25 NOTE — Telephone Encounter (Signed)
ok'd refill xanax #45 with one refill.   

## 2016-02-27 ENCOUNTER — Telehealth: Payer: Self-pay | Admitting: Internal Medicine

## 2016-02-27 DIAGNOSIS — E7800 Pure hypercholesterolemia, unspecified: Secondary | ICD-10-CM

## 2016-02-27 DIAGNOSIS — R739 Hyperglycemia, unspecified: Secondary | ICD-10-CM

## 2016-02-27 NOTE — Telephone Encounter (Signed)
Pt has a physical coming up in April. She would like to make a fasting lab appt before her physical that day. If Dr. Nicki Reaper wants her to have labs we need orders in the system so a lab appt can be made.  Thanks

## 2016-02-27 NOTE — Telephone Encounter (Signed)
Please advise if these test are okay, thanks

## 2016-02-28 NOTE — Telephone Encounter (Signed)
I have placed the orders for the labs.  Ok to schedule lab appt prior to her f/u appt

## 2016-02-28 NOTE — Telephone Encounter (Signed)
Pt labs scheduled

## 2016-03-12 ENCOUNTER — Other Ambulatory Visit (INDEPENDENT_AMBULATORY_CARE_PROVIDER_SITE_OTHER): Payer: Medicare Other

## 2016-03-12 ENCOUNTER — Ambulatory Visit (INDEPENDENT_AMBULATORY_CARE_PROVIDER_SITE_OTHER): Payer: Medicare Other | Admitting: Internal Medicine

## 2016-03-12 ENCOUNTER — Encounter: Payer: Self-pay | Admitting: Internal Medicine

## 2016-03-12 VITALS — BP 110/70 | HR 59 | Temp 97.5°F | Resp 18 | Ht 60.75 in | Wt 184.4 lb

## 2016-03-12 DIAGNOSIS — E7800 Pure hypercholesterolemia, unspecified: Secondary | ICD-10-CM

## 2016-03-12 DIAGNOSIS — F419 Anxiety disorder, unspecified: Secondary | ICD-10-CM

## 2016-03-12 DIAGNOSIS — I10 Essential (primary) hypertension: Secondary | ICD-10-CM | POA: Diagnosis not present

## 2016-03-12 DIAGNOSIS — R739 Hyperglycemia, unspecified: Secondary | ICD-10-CM | POA: Diagnosis not present

## 2016-03-12 DIAGNOSIS — G6289 Other specified polyneuropathies: Secondary | ICD-10-CM | POA: Diagnosis not present

## 2016-03-12 DIAGNOSIS — L989 Disorder of the skin and subcutaneous tissue, unspecified: Secondary | ICD-10-CM

## 2016-03-12 DIAGNOSIS — Z0000 Encounter for general adult medical examination without abnormal findings: Secondary | ICD-10-CM | POA: Diagnosis not present

## 2016-03-12 LAB — HEPATIC FUNCTION PANEL
ALBUMIN: 3.9 g/dL (ref 3.5–5.2)
ALT: 13 U/L (ref 0–35)
AST: 19 U/L (ref 0–37)
Alkaline Phosphatase: 63 U/L (ref 39–117)
Bilirubin, Direct: 0.1 mg/dL (ref 0.0–0.3)
Total Bilirubin: 0.6 mg/dL (ref 0.2–1.2)
Total Protein: 6.7 g/dL (ref 6.0–8.3)

## 2016-03-12 LAB — LIPID PANEL
CHOLESTEROL: 227 mg/dL — AB (ref 0–200)
HDL: 46.1 mg/dL (ref >39.00)
LDL CALC: 156 mg/dL — AB (ref 0–99)
NONHDL: 180.97
Total CHOL/HDL Ratio: 5
Triglycerides: 126 mg/dL (ref 0.0–149.0)
VLDL: 25.2 mg/dL (ref 0.0–40.0)

## 2016-03-12 LAB — BASIC METABOLIC PANEL
BUN: 14 mg/dL (ref 6–23)
CHLORIDE: 101 meq/L (ref 96–112)
CO2: 31 mEq/L (ref 19–32)
Calcium: 9.4 mg/dL (ref 8.4–10.5)
Creatinine, Ser: 0.82 mg/dL (ref 0.40–1.20)
GFR: 70.57 mL/min (ref >60.00)
GLUCOSE: 108 mg/dL — AB (ref 70–99)
POTASSIUM: 4.5 meq/L (ref 3.5–5.1)
SODIUM: 137 meq/L (ref 135–145)

## 2016-03-12 LAB — HEMOGLOBIN A1C: HEMOGLOBIN A1C: 6.1 % (ref 4.6–6.5)

## 2016-03-12 MED ORDER — ALPHA-LIPOIC ACID 300 MG PO TABS: 1.0000 | ORAL_TABLET | Freq: Every day | ORAL | Status: DC

## 2016-03-12 MED ORDER — CLOTRIMAZOLE-BETAMETHASONE 1-0.05 % EX CREA: 1.0000 "application " | TOPICAL_CREAM | Freq: Two times a day (BID) | CUTANEOUS | Status: DC

## 2016-03-12 NOTE — Progress Notes (Signed)
Pre-visit discussion using our clinic review tool. No additional management support is needed unless otherwise documented below in the visit note.  

## 2016-03-12 NOTE — Progress Notes (Signed)
[name], female   DOB: [birth year], 82 y.o.   MRN: [id number]   Subjective:    [name], female    DOB: 1929-12-19, 82 y.o.   MRN: [id number]  HPI  Patient here for her physical exam.  She states she noticed an episode of increased heart rate over this past weekend.  States was similar to her previous episodes.  She has been evaluated by cardiology.  Instructed to take and extra metoprolol when occurs.  Reports with this episode, she had some increased gas and burping.  Resolved.  Has not had any further episodes.  No chest pain.  No sob.  No acid reflux.  No increased burping.  No abdominal pain or cramping.  Bowels stable.  Reports persistent back lesion - upper back.  No itching.  Handling stress.     Past Medical History  Diagnosis Date  . Hyperlipidemia   . Degenerative disc disease   . Anxiety   . Depression   . GERD (gastroesophageal reflux disease)    Past Surgical History  Procedure Laterality Date  . Rectocele repair    . Cystocele repair    . Breast surgery  1960 to 1970    lumps removed  . Abdominal hysterectomy  1997    partial   Family History  Problem Relation Age of Onset  . Heart disease Mother   . Arthritis Mother   . Ulcers Father     uremic poisoning  . Arthritis Sister     x 4  . Hyperlipidemia Sister     x 1  . Hypertension Sister   . Cancer Sister     x 2 (ovarian cancer) & 1 (colon cancer)  . Diabetes Sister    Social History   Social History  . Marital Status: Married    Spouse Name: N/A  . Number of Children: N/A  . Years of Education: N/A   Social History Main Topics  . Smoking status: Never Smoker   . Smokeless tobacco: Never Used  . Alcohol Use: No  . Drug Use: No  . Sexual Activity: Not Asked   Other Topics Concern  . None   Social History Narrative    Outpatient Encounter Prescriptions as of 03/12/2016  Medication Sig  . Alpha-Lipoic Acid 300 MG TABS Take 1 tablet (300 mg total) by mouth  daily.  Marland Kitchen ALPRAZolam (XANAX) 0.5 MG tablet TAKE 1 TABLET BY MOUTH AT BEDTIME AND 1/2 TABLET DURING THE DAY  . aspirin 81 MG tablet Take 81 mg by mouth daily.  . metoprolol succinate (TOPROL-XL) 25 MG 24 hr tablet Take one-half tablet by  mouth twice a day  . Multiple Vitamin (MULTIVITAMIN) tablet Take 1 tablet by mouth daily.  . vitamin C (ASCORBIC ACID) 250 MG tablet Take 250 mg by mouth daily.  . vitamin E 400 UNIT capsule Take 400 Units by mouth daily.  . [DISCONTINUED] Alpha-Lipoic Acid 300 MG TABS Take 1 tablet by mouth daily.  . clotrimazole-betamethasone (LOTRISONE) cream Apply 1 application topically 2 (two) times daily.  . [DISCONTINUED] RABEprazole (ACIPHEX) 20 MG tablet Take 1 tablet (20 mg total) by mouth daily.   No facility-administered encounter medications on file as of 03/12/2016.    Review of Systems  Constitutional: Negative for appetite change and unexpected weight change.  HENT: Negative for congestion and sinus pressure.   Eyes: Negative for pain and visual disturbance.  Respiratory: Negative for cough, chest tightness and shortness of breath.  Cardiovascular: Negative for chest pain, palpitations and leg swelling.  Gastrointestinal: Negative for nausea, vomiting, abdominal pain and diarrhea.  Genitourinary: Negative for dysuria and difficulty urinating.  Musculoskeletal: Negative for back pain and joint swelling.  Skin: Negative for color change and rash.  Neurological: Negative for dizziness, light-headedness and headaches.  Hematological: Negative for adenopathy. Does not bruise/bleed easily.  Psychiatric/Behavioral: Negative for dysphoric mood and agitation.       Objective:    Physical Exam  Constitutional: She is oriented to person, place, and time. She appears well-developed and well-nourished. No distress.  HENT:  Nose: Nose normal.  Mouth/Throat: Oropharynx is clear and moist.  Eyes: Right eye exhibits no discharge. Left eye exhibits no discharge.  No scleral icterus.  Neck: Neck supple. No thyromegaly present.  Cardiovascular: Normal rate and regular rhythm.   Pulmonary/Chest: Breath sounds normal. No accessory muscle usage. No tachypnea. No respiratory distress. She has no decreased breath sounds. She has no wheezes. She has no rhonchi. Right breast exhibits no inverted nipple, no mass, no nipple discharge and no tenderness (no axillary adenopathy). Left breast exhibits no inverted nipple, no mass, no nipple discharge and no tenderness (no axilarry adenopathy).  Abdominal: Soft. Bowel sounds are normal. There is no tenderness.  Musculoskeletal: She exhibits no edema or tenderness.  Lymphadenopathy:    She has no cervical adenopathy.  Neurological: She is alert and oriented to person, place, and time.  Skin: Skin is warm. No rash noted. No erythema.  Psychiatric: She has a normal mood and affect. Her behavior is normal.    BP 110/70 mmHg  Pulse 59  Temp(Src) 97.5 F (36.4 C) (Oral)  Resp 18  Ht 5' 0.75" (1.543 m)  Wt 184 lb 6 oz (83.632 kg)  BMI 35.13 kg/m2  SpO2 94% Wt Readings from Last 3 Encounters:  03/12/16 184 lb 6 oz (83.632 kg)  09/05/15 186 lb 2 oz (84.426 kg)  03/07/15 184 lb 6 oz (83.632 kg)     Lab Results  Component Value Date   WBC 5.0 09/20/2015   HGB 14.6 09/20/2015   HCT 44.5 09/20/2015   PLT 316.0 09/20/2015   GLUCOSE 108* 03/12/2016   CHOL 227* 03/12/2016   TRIG 126.0 03/12/2016   HDL 46.10 03/12/2016   LDLCALC 156* 03/12/2016   ALT 13 03/12/2016   AST 19 03/12/2016   NA 137 03/12/2016   K 4.5 03/12/2016   CL 101 03/12/2016   CREATININE 0.82 03/12/2016   BUN 14 03/12/2016   CO2 31 03/12/2016   TSH 1.43 09/20/2015   HGBA1C 6.1 03/12/2016       Assessment & Plan:   Problem List Items Addressed This Visit    Anxiety    Stable on current regimen.  Follow.        Back skin lesion    Persistent.  lotrisone as directed.  Notify me if incomplete resolution.        Essential  hypertension, benign    Blood pressure under good control.  Continue same medication regimen.  Follow pressures.  Follow metabolic panel.        Health care maintenance    Physical today 03/12/16.  Last mammogram 09/21/14 - Birads I.  She declines further mammograms and declines colonoscopy.       Hypercholesterolemia    Low cholesterol diet and exercise.  Follow lipid panel.       Hyperglycemia    Low carb diet and exercise.  Follow met b and a1c.  Peripheral neuropathy (HCC)    Taking alpha-lipoic acid.  Stable.        Other Visit Diagnoses    Routine general medical examination at a health care facility    -  Primary        Einar Pheasant, MD

## 2016-03-15 ENCOUNTER — Encounter: Payer: Self-pay | Admitting: Internal Medicine

## 2016-03-15 DIAGNOSIS — L989 Disorder of the skin and subcutaneous tissue, unspecified: Secondary | ICD-10-CM | POA: Insufficient documentation

## 2016-03-15 NOTE — Assessment & Plan Note (Signed)
Blood pressure under good control.  Continue same medication regimen.  Follow pressures.  Follow metabolic panel.   

## 2016-03-15 NOTE — Assessment & Plan Note (Signed)
Low cholesterol diet and exercise.  Follow lipid panel.   

## 2016-03-15 NOTE — Assessment & Plan Note (Signed)
Taking alpha-lipoic acid.  Stable.

## 2016-03-15 NOTE — Assessment & Plan Note (Signed)
Physical today 03/12/16.  Last mammogram 09/21/14 - Birads I.  She declines further mammograms and declines colonoscopy.

## 2016-03-15 NOTE — Assessment & Plan Note (Signed)
Persistent.  lotrisone as directed.  Notify me if incomplete resolution.

## 2016-03-15 NOTE — Assessment & Plan Note (Signed)
Low carb diet and exercise.  Follow met b and a1c.  

## 2016-03-15 NOTE — Assessment & Plan Note (Signed)
Stable on current regimen.  Follow.   

## 2016-03-16 ENCOUNTER — Encounter: Payer: Self-pay | Admitting: *Deleted

## 2016-03-27 DIAGNOSIS — H2511 Age-related nuclear cataract, right eye: Secondary | ICD-10-CM | POA: Diagnosis not present

## 2016-03-27 DIAGNOSIS — H35372 Puckering of macula, left eye: Secondary | ICD-10-CM | POA: Diagnosis not present

## 2016-04-22 ENCOUNTER — Other Ambulatory Visit: Payer: Self-pay | Admitting: Internal Medicine

## 2016-04-22 NOTE — Telephone Encounter (Signed)
Okay to refill? Last seen on 4/17

## 2016-04-23 NOTE — Telephone Encounter (Signed)
ok'd xanax rx for #45 with one refill.

## 2016-04-23 NOTE — Telephone Encounter (Signed)
Rx faxed

## 2016-06-15 ENCOUNTER — Other Ambulatory Visit: Payer: Self-pay

## 2016-06-15 MED ORDER — ALPRAZOLAM 0.5 MG PO TABS: ORAL_TABLET | ORAL | Status: DC

## 2016-06-15 NOTE — Telephone Encounter (Signed)
Please advise refill, last done in May with one refill, thanks

## 2016-06-15 NOTE — Telephone Encounter (Signed)
ok'd xanax #45 with one refill.

## 2016-06-22 ENCOUNTER — Telehealth: Payer: Self-pay | Admitting: *Deleted

## 2016-06-22 NOTE — Telephone Encounter (Signed)
Spoke with Steffanie Dunn at Pharmacy. Rx sent, thanks

## 2016-06-22 NOTE — Telephone Encounter (Signed)
Pt has requested a medication refill for alprazolam  Pharmacy [facility] in [place]

## 2016-08-01 ENCOUNTER — Other Ambulatory Visit: Payer: Self-pay | Admitting: Internal Medicine

## 2016-08-26 ENCOUNTER — Telehealth: Payer: Self-pay | Admitting: Internal Medicine

## 2016-08-26 ENCOUNTER — Other Ambulatory Visit: Payer: Self-pay | Admitting: Internal Medicine

## 2016-08-26 MED ORDER — ALPRAZOLAM 0.5 MG PO TABS: ORAL_TABLET | ORAL | 1 refills | Status: DC

## 2016-08-26 NOTE — Telephone Encounter (Signed)
Refilled 06/15/16. Pt last seen 03/12/16. Please dvise?

## 2016-08-26 NOTE — Telephone Encounter (Signed)
faxed

## 2016-08-26 NOTE — Telephone Encounter (Signed)
Refill sent to PCP to approve.

## 2016-08-26 NOTE — Telephone Encounter (Signed)
Pt called needing a refill for ALPRAZolam (XANAX) 0.5 MG tablet.  Pharmacy is [id number] - [place], [place]  Call pt @ [phone]. Thank you!

## 2016-08-27 NOTE — Telephone Encounter (Signed)
Pt made aware of rx's

## 2016-08-27 NOTE — Telephone Encounter (Signed)
Faxed

## 2016-08-27 NOTE — Telephone Encounter (Signed)
Patient has requested an update for this Rx

## 2016-10-01 ENCOUNTER — Inpatient Hospital Stay
Admission: EM | Admit: 2016-10-01 | Discharge: 2016-10-04 | DRG: 392 | Disposition: A | Discharge status: Other Acute Inpatient Hospital | Payer: Medicare Other | Attending: Internal Medicine | Admitting: Internal Medicine

## 2016-10-01 DIAGNOSIS — Z7982 Long term (current) use of aspirin: Secondary | ICD-10-CM | POA: Diagnosis not present

## 2016-10-01 DIAGNOSIS — F329 Major depressive disorder, single episode, unspecified: Secondary | ICD-10-CM | POA: Diagnosis present

## 2016-10-01 DIAGNOSIS — Z8261 Family history of arthritis: Secondary | ICD-10-CM | POA: Diagnosis not present

## 2016-10-01 DIAGNOSIS — Z833 Family history of diabetes mellitus: Secondary | ICD-10-CM | POA: Diagnosis not present

## 2016-10-01 DIAGNOSIS — R1310 Dysphagia, unspecified: Secondary | ICD-10-CM | POA: Diagnosis not present

## 2016-10-01 DIAGNOSIS — R109 Unspecified abdominal pain: Secondary | ICD-10-CM | POA: Diagnosis not present

## 2016-10-01 DIAGNOSIS — E785 Hyperlipidemia, unspecified: Secondary | ICD-10-CM | POA: Diagnosis present

## 2016-10-01 DIAGNOSIS — I10 Essential (primary) hypertension: Secondary | ICD-10-CM | POA: Diagnosis present

## 2016-10-01 DIAGNOSIS — K449 Diaphragmatic hernia without obstruction or gangrene: Principal | ICD-10-CM | POA: Diagnosis present

## 2016-10-01 DIAGNOSIS — Z66 Do not resuscitate: Secondary | ICD-10-CM | POA: Diagnosis present

## 2016-10-01 DIAGNOSIS — Z888 Allergy status to other drugs, medicaments and biological substances status: Secondary | ICD-10-CM | POA: Diagnosis not present

## 2016-10-01 DIAGNOSIS — M1990 Unspecified osteoarthritis, unspecified site: Secondary | ICD-10-CM | POA: Diagnosis not present

## 2016-10-01 DIAGNOSIS — K219 Gastro-esophageal reflux disease without esophagitis: Secondary | ICD-10-CM | POA: Diagnosis present

## 2016-10-01 DIAGNOSIS — Z4682 Encounter for fitting and adjustment of non-vascular catheter: Secondary | ICD-10-CM | POA: Diagnosis not present

## 2016-10-01 DIAGNOSIS — K5730 Diverticulosis of large intestine without perforation or abscess without bleeding: Secondary | ICD-10-CM | POA: Diagnosis not present

## 2016-10-01 DIAGNOSIS — Z880 Allergy status to penicillin: Secondary | ICD-10-CM

## 2016-10-01 DIAGNOSIS — Z8249 Family history of ischemic heart disease and other diseases of the circulatory system: Secondary | ICD-10-CM | POA: Diagnosis not present

## 2016-10-01 DIAGNOSIS — F419 Anxiety disorder, unspecified: Secondary | ICD-10-CM | POA: Diagnosis present

## 2016-10-01 DIAGNOSIS — K2210 Ulcer of esophagus without bleeding: Secondary | ICD-10-CM | POA: Diagnosis not present

## 2016-10-01 DIAGNOSIS — K922 Gastrointestinal hemorrhage, unspecified: Secondary | ICD-10-CM | POA: Diagnosis not present

## 2016-10-01 DIAGNOSIS — R112 Nausea with vomiting, unspecified: Secondary | ICD-10-CM | POA: Diagnosis not present

## 2016-10-01 DIAGNOSIS — Z538 Procedure and treatment not carried out for other reasons: Secondary | ICD-10-CM | POA: Diagnosis not present

## 2016-10-02 ENCOUNTER — Emergency Department: Payer: Medicare Other

## 2016-10-02 ENCOUNTER — Inpatient Hospital Stay: Payer: Medicare Other

## 2016-10-02 DIAGNOSIS — Z880 Allergy status to penicillin: Secondary | ICD-10-CM | POA: Diagnosis not present

## 2016-10-02 DIAGNOSIS — Z888 Allergy status to other drugs, medicaments and biological substances status: Secondary | ICD-10-CM | POA: Diagnosis not present

## 2016-10-02 DIAGNOSIS — R109 Unspecified abdominal pain: Secondary | ICD-10-CM | POA: Diagnosis present

## 2016-10-02 DIAGNOSIS — E785 Hyperlipidemia, unspecified: Secondary | ICD-10-CM | POA: Diagnosis present

## 2016-10-02 DIAGNOSIS — Z8249 Family history of ischemic heart disease and other diseases of the circulatory system: Secondary | ICD-10-CM | POA: Diagnosis not present

## 2016-10-02 DIAGNOSIS — K449 Diaphragmatic hernia without obstruction or gangrene: Secondary | ICD-10-CM | POA: Diagnosis present

## 2016-10-02 DIAGNOSIS — K219 Gastro-esophageal reflux disease without esophagitis: Secondary | ICD-10-CM | POA: Diagnosis present

## 2016-10-02 DIAGNOSIS — F419 Anxiety disorder, unspecified: Secondary | ICD-10-CM | POA: Diagnosis present

## 2016-10-02 DIAGNOSIS — I10 Essential (primary) hypertension: Secondary | ICD-10-CM | POA: Diagnosis present

## 2016-10-02 DIAGNOSIS — Z7982 Long term (current) use of aspirin: Secondary | ICD-10-CM | POA: Diagnosis not present

## 2016-10-02 DIAGNOSIS — Z8261 Family history of arthritis: Secondary | ICD-10-CM | POA: Diagnosis not present

## 2016-10-02 DIAGNOSIS — F329 Major depressive disorder, single episode, unspecified: Secondary | ICD-10-CM | POA: Diagnosis present

## 2016-10-02 DIAGNOSIS — K2210 Ulcer of esophagus without bleeding: Secondary | ICD-10-CM | POA: Diagnosis present

## 2016-10-02 DIAGNOSIS — Z66 Do not resuscitate: Secondary | ICD-10-CM | POA: Diagnosis present

## 2016-10-02 DIAGNOSIS — Z833 Family history of diabetes mellitus: Secondary | ICD-10-CM | POA: Diagnosis not present

## 2016-10-02 LAB — COMPREHENSIVE METABOLIC PANEL
ALBUMIN: 4.1 g/dL (ref 3.5–5.0)
ALK PHOS: 58 U/L (ref 38–126)
ALT: 16 U/L (ref 14–54)
ANION GAP: 11 (ref 5–15)
AST: 38 U/L (ref 15–41)
BUN: 15 mg/dL (ref 6–20)
CALCIUM: 9.8 mg/dL (ref 8.9–10.3)
CHLORIDE: 98 mmol/L — AB (ref 101–111)
CO2: 31 mmol/L (ref 22–32)
Creatinine, Ser: 0.85 mg/dL (ref 0.44–1.00)
GFR calc Af Amer: >60 mL/min (ref >60)
GFR calc non Af Amer: >60 mL/min (ref >60)
GLUCOSE: 165 mg/dL — AB (ref 65–99)
Potassium: 4.6 mmol/L (ref 3.5–5.1)
SODIUM: 140 mmol/L (ref 135–145)
Total Bilirubin: 1.2 mg/dL (ref 0.3–1.2)
Total Protein: 7.3 g/dL (ref 6.5–8.1)

## 2016-10-02 LAB — URINALYSIS COMPLETE WITH MICROSCOPIC (ARMC ONLY)
BACTERIA UA: NONE SEEN
Bilirubin Urine: NEGATIVE
Glucose, UA: NEGATIVE mg/dL
HGB URINE DIPSTICK: NEGATIVE
LEUKOCYTES UA: NEGATIVE
Nitrite: NEGATIVE
PH: 7 (ref 5.0–8.0)
PROTEIN: NEGATIVE mg/dL
SPECIFIC GRAVITY, URINE: 1.015 (ref 1.005–1.030)

## 2016-10-02 LAB — TSH: TSH: 1.193 u[IU]/mL (ref 0.350–4.500)

## 2016-10-02 LAB — CBC
HCT: 44.1 % (ref 35.0–47.0)
Hemoglobin: 15.1 g/dL (ref 12.0–16.0)
MCH: 31 pg (ref 26.0–34.0)
MCHC: 34.2 g/dL (ref 32.0–36.0)
MCV: 90.6 fL (ref 80.0–100.0)
PLATELETS: 279 10*3/uL (ref 150–440)
RBC: 4.87 MIL/uL (ref 3.80–5.20)
RDW: 13.5 % (ref 11.5–14.5)
WBC: 11.1 10*3/uL — ABNORMAL HIGH (ref 3.6–11.0)

## 2016-10-02 LAB — LIPASE, BLOOD: Lipase: 34 U/L (ref 11–51)

## 2016-10-02 LAB — TROPONIN I

## 2016-10-02 MED ORDER — ENOXAPARIN SODIUM 40 MG/0.4ML ~~LOC~~ SOLN
40.0000 mg | SUBCUTANEOUS | Status: DC
  Administered 2016-10-02 – 2016-10-03 (×2): 40 mg via SUBCUTANEOUS
  Filled 2016-10-02 (×2): qty 0.4

## 2016-10-02 MED ORDER — CLOTRIMAZOLE 1 % EX CREA
TOPICAL_CREAM | Freq: Two times a day (BID) | CUTANEOUS | Status: DC
  Filled 2016-10-02: qty 15

## 2016-10-02 MED ORDER — LORAZEPAM 2 MG/ML IJ SOLN
1.0000 mg | Freq: Once | INTRAMUSCULAR | Status: AC
  Administered 2016-10-02: 1 mg via INTRAVENOUS
  Filled 2016-10-02: qty 1

## 2016-10-02 MED ORDER — ACETAMINOPHEN 325 MG PO TABS: 650.0000 mg | ORAL_TABLET | Freq: Four times a day (QID) | ORAL | Status: DC | PRN

## 2016-10-02 MED ORDER — ACETAMINOPHEN 650 MG RE SUPP: 650.0000 mg | Freq: Four times a day (QID) | RECTAL | Status: DC | PRN

## 2016-10-02 MED ORDER — DOCUSATE SODIUM 100 MG PO CAPS: 100.0000 mg | ORAL_CAPSULE | Freq: Every day | ORAL | Status: DC | PRN

## 2016-10-02 MED ORDER — MORPHINE SULFATE (PF) 2 MG/ML IV SOLN
INTRAVENOUS | Status: AC
  Administered 2016-10-02: 2 mg via INTRAVENOUS
  Filled 2016-10-02: qty 1

## 2016-10-02 MED ORDER — SODIUM CHLORIDE 0.9 % IV BOLUS (SEPSIS): 500.0000 mL | Freq: Once | INTRAVENOUS | Status: DC

## 2016-10-02 MED ORDER — FAMOTIDINE IN NACL 20-0.9 MG/50ML-% IV SOLN
20.0000 mg | Freq: Two times a day (BID) | INTRAVENOUS | Status: DC
  Administered 2016-10-02 – 2016-10-04 (×5): 20 mg via INTRAVENOUS
  Filled 2016-10-02 (×8): qty 50

## 2016-10-02 MED ORDER — ONDANSETRON HCL 4 MG/2ML IJ SOLN
4.0000 mg | Freq: Once | INTRAMUSCULAR | Status: AC | PRN
  Administered 2016-10-02: 4 mg via INTRAVENOUS

## 2016-10-02 MED ORDER — ONDANSETRON HCL 4 MG PO TABS: 4.0000 mg | ORAL_TABLET | Freq: Four times a day (QID) | ORAL | Status: DC | PRN

## 2016-10-02 MED ORDER — MORPHINE SULFATE (PF) 2 MG/ML IV SOLN
2.0000 mg | Freq: Once | INTRAVENOUS | Status: AC
  Administered 2016-10-02: 2 mg via INTRAVENOUS

## 2016-10-02 MED ORDER — PROCHLORPERAZINE EDISYLATE 5 MG/ML IJ SOLN: 5.0000 mg | Freq: Four times a day (QID) | INTRAMUSCULAR | Status: DC | PRN

## 2016-10-02 MED ORDER — PROMETHAZINE HCL 25 MG/ML IJ SOLN
12.5000 mg | Freq: Once | INTRAMUSCULAR | Status: AC
  Administered 2016-10-02: 12.5 mg via INTRAVENOUS
  Filled 2016-10-02: qty 1

## 2016-10-02 MED ORDER — SODIUM CHLORIDE 0.9 % IV BOLUS (SEPSIS)
250.0000 mL | Freq: Once | INTRAVENOUS | Status: AC
  Administered 2016-10-02: 250 mL via INTRAVENOUS

## 2016-10-02 MED ORDER — PANTOPRAZOLE SODIUM 40 MG PO TBEC
40.0000 mg | DELAYED_RELEASE_TABLET | Freq: Every day | ORAL | Status: DC
  Filled 2016-10-02: qty 1

## 2016-10-02 MED ORDER — IOPAMIDOL (ISOVUE-300) INJECTION 61%
100.0000 mL | Freq: Once | INTRAVENOUS | Status: AC | PRN
  Administered 2016-10-02: 100 mL via INTRAVENOUS

## 2016-10-02 MED ORDER — ONDANSETRON HCL 4 MG/2ML IJ SOLN
INTRAMUSCULAR | Status: AC
  Administered 2016-10-02: 4 mg via INTRAVENOUS
  Filled 2016-10-02: qty 2

## 2016-10-02 MED ORDER — ONDANSETRON HCL 4 MG/2ML IJ SOLN: 4.0000 mg | Freq: Four times a day (QID) | INTRAMUSCULAR | Status: DC | PRN

## 2016-10-02 MED ORDER — GI COCKTAIL ~~LOC~~
30.0000 mL | Freq: Once | ORAL | Status: AC
  Administered 2016-10-02: 30 mL via ORAL

## 2016-10-02 MED ORDER — IOPAMIDOL (ISOVUE-300) INJECTION 61%: 30.0000 mL | Freq: Once | INTRAVENOUS | Status: DC | PRN

## 2016-10-02 MED ORDER — SODIUM CHLORIDE 0.9 % IV SOLN
INTRAVENOUS | Status: DC
  Administered 2016-10-02 – 2016-10-04 (×3): via INTRAVENOUS

## 2016-10-02 MED ORDER — ALPRAZOLAM 0.25 MG PO TABS
0.2500 mg | ORAL_TABLET | Freq: Three times a day (TID) | ORAL | Status: DC | PRN
  Administered 2016-10-03 – 2016-10-04 (×2): 0.25 mg via ORAL
  Filled 2016-10-02 (×2): qty 1

## 2016-10-02 MED ORDER — SODIUM CHLORIDE 0.9% FLUSH
3.0000 mL | Freq: Two times a day (BID) | INTRAVENOUS | Status: DC
  Administered 2016-10-03: 3 mL via INTRAVENOUS

## 2016-10-02 MED ORDER — GI COCKTAIL ~~LOC~~
ORAL | Status: AC
  Administered 2016-10-02: 30 mL via ORAL
  Filled 2016-10-02: qty 30

## 2016-10-02 MED ORDER — METOPROLOL SUCCINATE ER 25 MG PO TB24
12.5000 mg | ORAL_TABLET | Freq: Every day | ORAL | Status: DC
  Filled 2016-10-02 (×2): qty 1

## 2016-10-02 NOTE — ED Notes (Signed)
Called lab checking on blood work for pt. Sandra Reyes stated she found the blood and would run the tests ordered.

## 2016-10-02 NOTE — ED Provider Notes (Signed)
[facility] Emergency Department Provider Note   First MD Initiated Contact with Patient 10/01/16 2359     (approximate)  I have reviewed the triage vital signs and the nursing notes.   HISTORY  Chief Complaint Abdominal Pain   HPI Sandra Reyes is a 82 y.o. female with history of anxiety GERD and hyperlipidemia presents to the emergency department with burning chest pain and vomiting accompanied by "belching repetitively today. Patient denies any fever afebrile on presentation temperature 99.3. Patient denies any diarrhea no constipation.   Past Medical History:  Diagnosis Date  . Anxiety   . Degenerative disc disease   . Depression   . GERD (gastroesophageal reflux disease)   . Hyperlipidemia     Patient Active Problem List   Diagnosis Date Noted  . Diaphragmatic hernia 10/02/2016  . Back skin lesion 03/15/2016  . GERD (gastroesophageal reflux disease) 09/08/2015  . Hyperglycemia 09/08/2015  . Health care maintenance 03/10/2015  . Peripheral neuropathy ([place]) 02/18/2014  . Vision changes 02/18/2014  . Chronic back pain 05/06/2013  . Anxiety 05/06/2013  . Essential hypertension, benign 05/04/2013  . Hypercholesterolemia 05/04/2013    Past Surgical History:  Procedure Laterality Date  . ABDOMINAL HYSTERECTOMY  1997   partial  . BREAST SURGERY  1960 to 1970   lumps removed  . CYSTOCELE REPAIR    . RECTOCELE REPAIR      Prior to Admission medications   Medication Sig Start Date End Date Taking? Authorizing Provider  Alpha-Lipoic Acid 300 MG TABS Take 1 tablet (300 mg total) by mouth daily. 03/12/16  Yes Einar Pheasant, MD  ALPRAZolam Duanne Moron) 0.5 MG tablet TAKE 1 TABLET BY MOUTH AT BEDTIME & 1/2 TABLET DURING THE DAY 08/26/16  Yes Einar Pheasant, MD  aspirin 81 MG tablet Take 81 mg by mouth daily.   Yes Historical Provider, MD  metoprolol succinate (TOPROL-XL) 25 MG 24 hr tablet Take one-half tablet by  mouth twice a day 08/04/16  Yes  Einar Pheasant, MD  Multiple Vitamin (MULTIVITAMIN) tablet Take 1 tablet by mouth daily.   Yes Historical Provider, MD  vitamin C (ASCORBIC ACID) 250 MG tablet Take 250 mg by mouth daily.   Yes Historical Provider, MD  vitamin E 400 UNIT capsule Take 400 Units by mouth daily.   Yes Historical Provider, MD    Allergies Celexa [citalopram]; Effexor [venlafaxine]; Hormone cream base; Lipitor [atorvastatin]; Macrobid [nitrofurantoin macrocrystal]; Nortriptyline; Penicillin v potassium; Prozac [fluoxetine]; and Sulfa antibiotics  Family History  Problem Relation Age of Onset  . Heart disease Mother   . Arthritis Mother   . Ulcers Father     uremic poisoning  . Arthritis Sister     x 4  . Hyperlipidemia Sister     x 1  . Hypertension Sister   . Cancer Sister     x 2 (ovarian cancer) & 1 (colon cancer)  . Diabetes Sister     Social History Social History  Substance Use Topics  . Smoking status: Never Smoker  . Smokeless tobacco: Never Used  . Alcohol use No    Review of Systems Constitutional: No fever/chills Eyes: No visual changes. ENT: No sore throat. Cardiovascular: Positive for burning chest pain Respiratory: Denies shortness of breath. Gastrointestinal: Positive for abdominal pain and vomiting  Genitourinary: Negative for dysuria. Musculoskeletal: Negative for back pain. Skin: Negative for rash. Neurological: Negative for headaches, focal weakness or numbness.  10-point ROS otherwise negative.  ____________________________________________   PHYSICAL EXAM:  VITAL  SIGNS: ED Triage Vitals  Enc Vitals Group     BP 10/02/16 0017 (!) 157/76     Pulse Rate 10/02/16 0017 68     Resp 10/02/16 0017 17     Temp 10/02/16 0017 99.3 F (37.4 C)     Temp Source 10/02/16 0017 Oral     SpO2 10/02/16 0017 97 %     Weight 10/02/16 0018 175 lb (79.4 kg)     Height 10/02/16 0018 5\' 2"  (1.575 m)     Head Circumference --      Peak Flow --      Pain Score 10/02/16 0018 10      Pain Loc --      Pain Edu? --      Excl. in [place]? --     Constitutional: Alert and oriented. Apparent discomfort. Eyes: Conjunctivae are normal. PERRL. EOMI. Head: Atraumatic. Ears:  Healthy appearing ear canals and TMs bilaterally Nose: No congestion/rhinnorhea. Mouth/Throat: Mucous membranes are moist.  Oropharynx non-erythematous. Neck: No stridor.  No meningeal signs.  No cervical spine tenderness to palpation. Cardiovascular: Normal rate, regular rhythm. Good peripheral circulation. Grossly normal heart sounds. Respiratory: Normal respiratory effort.  No retractions. Lungs CTAB. Gastrointestinal: Soft and nontender. No distention.  Musculoskeletal: No lower extremity tenderness nor edema. No gross deformities of extremities. Neurologic:  Normal speech and language. No gross focal neurologic deficits are appreciated.  Skin:  Skin is warm, dry and intact. No rash noted. Psychiatric: Mood and affect are normal. Speech and behavior are normal.  ____________________________________________   LABS (all labs ordered are listed, but only abnormal results are displayed)  Labs Reviewed  COMPREHENSIVE METABOLIC PANEL - Abnormal; Notable for the following:       Result Value   Chloride 98 (*)    Glucose, Bld 165 (*)    All other components within normal limits  URINALYSIS COMPLETEWITH MICROSCOPIC ([facility] ONLY) - Abnormal; Notable for the following:    Color, Urine YELLOW (*)    APPearance HAZY (*)    Ketones, ur TRACE (*)    Squamous Epithelial / LPF 0-5 (*)    All other components within normal limits  CBC - Abnormal; Notable for the following:    WBC 11.1 (*)    All other components within normal limits  LIPASE, BLOOD  TROPONIN I  TSH  HEMOGLOBIN A1C   ____________________________________________  EKG  ED ECG REPORT I, [place] Faizan Geraci, the attending physician, personally viewed and interpreted this ECG.   Date: 10/02/2016  EKG Time: 11:58 PM  Rate: 68  Rhythm: Normal  sinus rhythm  Axis: Normal  Intervals: Normal  ST&T Change: None  ____________________________________________  RADIOLOGY I, [place] Eathel Pajak, personally viewed and evaluated these images (plain radiographs) as part of my medical decision making, as well as reviewing the written report by the radiologist.  CLINICAL DATA:  Acute onset of burning sensation after eating. Initial encounter.  EXAM: CT ABDOMEN AND PELVIS WITH CONTRAST  TECHNIQUE: Multidetector CT imaging of the abdomen and pelvis was performed using the standard protocol following bolus administration of intravenous contrast.  CONTRAST:  152mL ISOVUE-300 IOPAMIDOL (ISOVUE-300) INJECTION 61%  COMPARISON:  None.  FINDINGS: Lower chest: The visualized portions of the mediastinum are unremarkable, aside from scattered coronary artery calcifications. There is a relatively large paraesophageal hernia, filled with fluid and trace air.  Hepatobiliary: The liver is unremarkable in appearance. The gallbladder is unremarkable in appearance. The common bile duct remains normal in caliber.  Pancreas: There  is diffuse fatty infiltration of the pancreas. The pancreas is otherwise unremarkable.No adrenal hemorrhage or renal injury identified. Bladder is unremarkable.  Spleen: The spleen is unremarkable in appearance.  Adrenals/Urinary Tract: The adrenal glands are unremarkable in appearance. A large 8.2 cm cyst is noted arising at the lateral aspect of the right kidney. The kidneys are otherwise unremarkable. There is no evidence of hydronephrosis. No renal or ureteral stones are identified. No perinephric stranding is seen.  Stomach/Bowel: The stomach is unremarkable in appearance. The small bowel is within normal limits. The appendix is not visualized; there is no evidence for appendicitis. Diverticulosis is noted along the sigmoid colon, without evidence of diverticulitis.  Vascular/Lymphatic: Scattered  calcification is seen along the abdominal aorta and its branches. The abdominal aorta is otherwise grossly unremarkable. The inferior vena cava is grossly unremarkable. No retroperitoneal lymphadenopathy is seen. No pelvic sidewall lymphadenopathy is identified.  Reproductive: The bladder is mildly distended and grossly unremarkable. The patient is status post hysterectomy. No suspicious adnexal masses are seen.  Other: No additional soft tissue abnormalities are seen.  Musculoskeletal: No acute osseous abnormalities are identified. Facet disease is noted at the lumbar spine. Degenerative change is noted at the pubic symphysis. The visualized musculature is unremarkable in appearance.  IMPRESSION: 1. Relatively large paraesophageal hernia. Herniated stomach is filled with fluid and trace air. This may correspond to the patient's symptoms. 2. Scattered coronary artery calcifications. 3. Large right renal cyst noted. 4. Diverticulosis along the sigmoid colon, without evidence of diverticulitis. 5. Scattered aortic atherosclerosis noted.   Electronically Signed   By: Garald Balding M.D.   On: 10/02/2016 02:20   Vitals   Height Weight BMI (Calculated)  5\' 2"  (1.575 m) 81.3 kg 32.1  External Result Report   External Result Report  Imaging   Imaging Information  Signed by   Signed Date/Time  Phone Pager  [place], JEFFREY 10/02/2016 2:20 AM [phone]  Signed   Electronically signed by Santa Lighter, MD on 10/02/16 at 0220 EDT      Procedures    INITIAL IMPRESSION / [place] / ED COURSE  Pertinent labs & imaging results that were available during my care of the patient were reviewed by me and considered in my medical decision making (see chart for details).  Patient given multiple doses of IV Zofran followed by IV Phenergan with continued nausea and vomiting. As such an NG tube was placed which improved the patient's nausea and  resolved vomiting. Patient discussed with Dr. Burt Knack general surgeon on call regarding CT scan findings of a large paraesophageal hernia. Dr. Burt Knack stated that patient should be referred to CT surgery. As such patient discussed with Dr. Marcille Blanco for hospital admission with plan for CT surgery consultation   Clinical Course    ____________________________________________  FINAL CLINICAL IMPRESSION(S) / ED DIAGNOSES  Paraesophageal hernia Intractable nausea and vomiting   MEDICATIONS GIVEN DURING THIS VISIT:  Medications  iopamidol (ISOVUE-300) 61 % injection 30 mL (30 mLs Oral Canceled Entry 10/02/16 0045)  clotrimazole (LOTRIMIN) 1 % cream (not administered)  sodium chloride flush (NS) 0.9 % injection 3 mL (not administered)  0.9 %  sodium chloride infusion ( Intravenous New Bag/Given 10/02/16 0449)  acetaminophen (TYLENOL) tablet 650 mg (not administered)    Or  acetaminophen (TYLENOL) suppository 650 mg (not administered)  docusate sodium (COLACE) capsule 100 mg (not administered)  ondansetron (ZOFRAN) tablet 4 mg (not administered)    Or  ondansetron (ZOFRAN) injection 4 mg (not administered)  prochlorperazine (COMPAZINE) injection 5 mg (not administered)  ondansetron (ZOFRAN) injection 4 mg (4 mg Intravenous Given 10/02/16 0049)  gi cocktail (Maalox,Lidocaine,Donnatal) (30 mLs Oral Given 10/02/16 0049)  LORazepam (ATIVAN) injection 1 mg (1 mg Intravenous Given 10/02/16 0114)  iopamidol (ISOVUE-300) 61 % injection 100 mL (100 mLs Intravenous Contrast Given 10/02/16 0147)  promethazine (PHENERGAN) injection 12.5 mg (12.5 mg Intravenous Given 10/02/16 0221)  sodium chloride 0.9 % bolus 250 mL (0 mLs Intravenous Stopped 10/02/16 0433)  morphine 2 MG/ML injection 2 mg (2 mg Intravenous Given 10/02/16 0234)     NEW OUTPATIENT MEDICATIONS STARTED DURING THIS VISIT:  Current Discharge Medication List      Current Discharge Medication List      Current Discharge Medication List      STOP taking these medications     clotrimazole-betamethasone (LOTRISONE) cream Comments:  Reason for Stopping:           Note:  This document was prepared using Dragon voice recognition software and may include unintentional dictation errors.    Gregor Hams, MD 10/02/16 [phone]

## 2016-10-02 NOTE — ED Notes (Signed)
Lab called stating lavender top had clotted and needed to be re-drawn/re-ordered.

## 2016-10-02 NOTE — H&P (Signed)
Sandra Reyes is an 82 y.o. female.   Chief Complaint: Abdominal pain HPI: The patient with past medical history of GERD presents to the emergency department complaining of abdominal pain. She has accompanying nausea and vomiting. CT of the patient's abdomen revealed diaphragmatic hernia. An NG tube was placed which relieved her vomiting and improved her pain. After consultation with the surgical service was determined that a thoracic approach to her hernia repair would be needed which would require medical management by the hospitalist service as there is no cardiothoracic admission service.  Past Medical History:  Diagnosis Date  . Anxiety   . Degenerative disc disease   . Depression   . GERD (gastroesophageal reflux disease)   . Hyperlipidemia     Past Surgical History:  Procedure Laterality Date  . ABDOMINAL HYSTERECTOMY  1997   partial  . BREAST SURGERY  1960 to 1970   lumps removed  . CYSTOCELE REPAIR    . RECTOCELE REPAIR      Family History  Problem Relation Age of Onset  . Heart disease Mother   . Arthritis Mother   . Ulcers Father     uremic poisoning  . Arthritis Sister     x 4  . Hyperlipidemia Sister     x 1  . Hypertension Sister   . Cancer Sister     x 2 (ovarian cancer) & 1 (colon cancer)  . Diabetes Sister    Social History:  reports that she has never smoked. She has never used smokeless tobacco. She reports that she does not drink alcohol or use drugs.  Allergies:  Allergies  Allergen Reactions  . Celexa [Citalopram]   . Effexor [Venlafaxine]   . Hormone Cream Base   . Lipitor [Atorvastatin]   . Macrobid [Nitrofurantoin Macrocrystal]   . Nortriptyline   . Penicillin V Potassium   . Prozac [Fluoxetine]   . Sulfa Antibiotics      (Not in a hospital admission)  Results for orders placed or performed during the hospital encounter of 10/01/16 (from the past 48 hour(s))  Lipase, blood     Status: None   Collection Time: 10/02/16 12:13 AM   Result Value Ref Range   Lipase 34 11 - 51 U/L  Comprehensive metabolic panel     Status: Abnormal   Collection Time: 10/02/16 12:13 AM  Result Value Ref Range   Sodium 140 135 - 145 mmol/L   Potassium 4.6 3.5 - 5.1 mmol/L    Comment: HEMOLYSIS AT THIS LEVEL MAY AFFECT RESULT   Chloride 98 (L) 101 - 111 mmol/L   CO2 31 22 - 32 mmol/L   Glucose, Bld 165 (H) 65 - 99 mg/dL   BUN 15 6 - 20 mg/dL   Creatinine, Ser 0.85 0.44 - 1.00 mg/dL   Calcium 9.8 8.9 - 10.3 mg/dL   Total Protein 7.3 6.5 - 8.1 g/dL   Albumin 4.1 3.5 - 5.0 g/dL   AST 38 15 - 41 U/L    Comment: HEMOLYSIS AT THIS LEVEL MAY AFFECT RESULT   ALT 16 14 - 54 U/L   Alkaline Phosphatase 58 38 - 126 U/L   Total Bilirubin 1.2 0.3 - 1.2 mg/dL    Comment: HEMOLYSIS AT THIS LEVEL MAY AFFECT RESULT   GFR calc non Af Amer >60 >60 mL/min   GFR calc Af Amer >60 >60 mL/min    Comment: (NOTE) The eGFR has been calculated using the CKD EPI equation. This calculation has not been validated  in all clinical situations. eGFR's persistently <60 mL/min signify possible Chronic Kidney Disease.    Anion gap 11 5 - 15  Troponin I     Status: None   Collection Time: 10/02/16 12:13 AM  Result Value Ref Range   Troponin I <0.03 <0.03 ng/mL  Urinalysis complete, with microscopic     Status: Abnormal   Collection Time: 10/02/16 12:24 AM  Result Value Ref Range   Color, Urine YELLOW (A) YELLOW   APPearance HAZY (A) CLEAR   Glucose, UA NEGATIVE NEGATIVE mg/dL   Bilirubin Urine NEGATIVE NEGATIVE   Ketones, ur TRACE (A) NEGATIVE mg/dL   Specific Gravity, Urine 1.015 1.005 - 1.030   Hgb urine dipstick NEGATIVE NEGATIVE   pH 7.0 5.0 - 8.0   Protein, ur NEGATIVE NEGATIVE mg/dL   Nitrite NEGATIVE NEGATIVE   Leukocytes, UA NEGATIVE NEGATIVE   RBC / HPF 0-5 0 - 5 RBC/hpf   WBC, UA 0-5 0 - 5 WBC/hpf   Bacteria, UA NONE SEEN NONE SEEN   Squamous Epithelial / LPF 0-5 (A) NONE SEEN   Mucous PRESENT    Budding Yeast PRESENT   CBC     Status:  Abnormal   Collection Time: 10/02/16  1:40 AM  Result Value Ref Range   WBC 11.1 (H) 3.6 - 11.0 K/uL   RBC 4.87 3.80 - 5.20 MIL/uL   Hemoglobin 15.1 12.0 - 16.0 g/dL   HCT 44.1 35.0 - 47.0 %   MCV 90.6 80.0 - 100.0 fL   MCH 31.0 26.0 - 34.0 pg   MCHC 34.2 32.0 - 36.0 g/dL   RDW 13.5 11.5 - 14.5 %   Platelets 279 150 - 440 K/uL   Ct Abdomen Pelvis W Contrast  Result Date: 10/02/2016 CLINICAL DATA:  Acute onset of burning sensation after eating. Initial encounter. EXAM: CT ABDOMEN AND PELVIS WITH CONTRAST TECHNIQUE: Multidetector CT imaging of the abdomen and pelvis was performed using the standard protocol following bolus administration of intravenous contrast. CONTRAST:  188m ISOVUE-300 IOPAMIDOL (ISOVUE-300) INJECTION 61% COMPARISON:  None. FINDINGS: Lower chest: The visualized portions of the mediastinum are unremarkable, aside from scattered coronary artery calcifications. There is a relatively large paraesophageal hernia, filled with fluid and trace air. Hepatobiliary: The liver is unremarkable in appearance. The gallbladder is unremarkable in appearance. The common bile duct remains normal in caliber. Pancreas: There is diffuse fatty infiltration of the pancreas. The pancreas is otherwise unremarkable.No adrenal hemorrhage or renal injury identified. Bladder is unremarkable. Spleen: The spleen is unremarkable in appearance. Adrenals/Urinary Tract: The adrenal glands are unremarkable in appearance. A large 8.2 cm cyst is noted arising at the lateral aspect of the right kidney. The kidneys are otherwise unremarkable. There is no evidence of hydronephrosis. No renal or ureteral stones are identified. No perinephric stranding is seen. Stomach/Bowel: The stomach is unremarkable in appearance. The small bowel is within normal limits. The appendix is not visualized; there is no evidence for appendicitis. Diverticulosis is noted along the sigmoid colon, without evidence of diverticulitis.  Vascular/Lymphatic: Scattered calcification is seen along the abdominal aorta and its branches. The abdominal aorta is otherwise grossly unremarkable. The inferior vena cava is grossly unremarkable. No retroperitoneal lymphadenopathy is seen. No pelvic sidewall lymphadenopathy is identified. Reproductive: The bladder is mildly distended and grossly unremarkable. The patient is status post hysterectomy. No suspicious adnexal masses are seen. Other: No additional soft tissue abnormalities are seen. Musculoskeletal: No acute osseous abnormalities are identified. Facet disease is noted at the lumbar spine.  Degenerative change is noted at the pubic symphysis. The visualized musculature is unremarkable in appearance. IMPRESSION: 1. Relatively large paraesophageal hernia. Herniated stomach is filled with fluid and trace air. This may correspond to the patient's symptoms. 2. Scattered coronary artery calcifications. 3. Large right renal cyst noted. 4. Diverticulosis along the sigmoid colon, without evidence of diverticulitis. 5. Scattered aortic atherosclerosis noted. Electronically Signed   By: Garald Balding M.D.   On: 10/02/2016 02:20    Review of Systems  Constitutional: Negative for chills and fever.  HENT: Negative for sore throat and tinnitus.   Eyes: Negative for blurred vision and redness.  Respiratory: Negative for cough and shortness of breath.   Cardiovascular: Negative for chest pain, palpitations, orthopnea and PND.  Gastrointestinal: Positive for nausea and vomiting. Negative for abdominal pain and diarrhea.  Genitourinary: Negative for dysuria, frequency and urgency.  Musculoskeletal: Negative for joint pain and myalgias.  Skin: Negative for rash.       No lesions  Neurological: Negative for speech change, focal weakness and weakness.  Endo/Heme/Allergies: Does not bruise/bleed easily.       No temperature intolerance  Psychiatric/Behavioral: Negative for depression and suicidal ideas.     Blood pressure (!) 177/87, pulse 72, temperature 99.3 F (37.4 C), temperature source Oral, resp. rate 17, height '5\' 2"'  (1.575 m), weight 79.4 kg (175 lb), SpO2 96 %. Physical Exam  Constitutional: She is oriented to person, place, and time. She appears well-developed and well-nourished. No distress.  HENT:  Head: Normocephalic and atraumatic.  Mouth/Throat: Oropharynx is clear and moist.  Eyes: Conjunctivae and EOM are normal. Pupils are equal, round, and reactive to light. No scleral icterus.  Neck: Normal range of motion. Neck supple. No JVD present. No tracheal deviation present. No thyromegaly present.  Cardiovascular: Normal rate, regular rhythm and normal heart sounds.  Exam reveals no gallop and no friction rub.   No murmur heard. Respiratory: Effort normal and breath sounds normal.  GI: Soft. Bowel sounds are normal. She exhibits no distension. There is no tenderness.  Genitourinary:  Genitourinary Comments: Deferred  Musculoskeletal: Normal range of motion. She exhibits no edema.  Lymphadenopathy:    She has no cervical adenopathy.  Neurological: She is alert and oriented to person, place, and time. No cranial nerve deficit. She exhibits normal muscle tone.  Skin: Skin is warm and dry. No rash noted. No erythema.  Psychiatric: She has a normal mood and affect. Her behavior is normal. Judgment and thought content normal.     Assessment/Plan This is an 82 year old female admitted for diaphragmatic hernia. 1. Diaphragmatic hernia: Nausea and vomiting is improved after NG placement. I have held the patient's aspirin. The patient is moderate to high risk given her advanced age for cardiothoracic surgery although she is in very good health overall. 2. Hypertension: Controlled; may continue beta blocker prior to surgery. 3. Anxiety: Ativan as needed 4. DVT prophylaxis: SCDs 5. GI prophylaxis: None The patient is a DO NOT RESUSCITATE. Time spent on admission orders and patient  care approximately 45 minutes  Harrie Foreman, MD 10/02/2016, 3:16 AM

## 2016-10-02 NOTE — ED Triage Notes (Addendum)
Pt reporting burning sensation after she eats certain foods that she cannot chew well.  Pt states specifically nuts, cole slaw and things in that area.  Pt reports burning sensation worsening today.  Pt belching repeatedly at this time. Pt given 4mg  zofran via EMS in the IV prior to arrival.

## 2016-10-02 NOTE — Progress Notes (Signed)
[place] [facility] [place] NAME: Sandra Reyes    MR#:  [id number]  DATE OF BIRTH:  1930/06/22  SUBJECTIVE:  CHIEF COMPLAINT:   Chief Complaint  Patient presents with  . Abdominal Pain   -Patient admitted with subacute nausea and vomiting and noted to have extensive paraesophageal hernia. -NG tube present in almost 900 mL dark greenish brown liquid being suctioned. -Refuses endoscopy at this time  REVIEW OF SYSTEMS:  Review of Systems  Constitutional: Negative for chills, fever and malaise/fatigue.  HENT: Negative for ear discharge, ear pain and nosebleeds.   Eyes: Negative for blurred vision and double vision.  Respiratory: Negative for cough, shortness of breath and wheezing.   Cardiovascular: Negative for chest pain, palpitations and leg swelling.  Gastrointestinal: Positive for nausea and vomiting. Negative for abdominal pain, constipation and diarrhea.  Genitourinary: Negative for dysuria.  Musculoskeletal: Negative for myalgias and neck pain.  Neurological: Negative for dizziness, sensory change, speech change, focal weakness, seizures and headaches.  Psychiatric/Behavioral: Negative for depression.    DRUG ALLERGIES:   Allergies  Allergen Reactions  . Celexa [Citalopram]   . Effexor [Venlafaxine]   . Hormone Cream Base   . Lipitor [Atorvastatin]   . Macrobid [Nitrofurantoin Macrocrystal]   . Nortriptyline   . Penicillin V Potassium     Has patient had a PCN reaction causing immediate rash, facial/tongue/throat swelling, SOB or lightheadedness with hypotension: Yes Has patient had a PCN reaction causing severe rash involving mucus membranes or skin necrosis: No Has patient had a PCN reaction that required hospitalization No Has patient had a PCN reaction occurring within the last 10 years: No If all of the above answers are "NO", then may proceed with Cephalosporin use.  . Prozac [Fluoxetine]   . Sulfa Antibiotics      VITALS:  Blood pressure (!) 124/42, pulse 65, temperature 98.6 F (37 C), temperature source Oral, resp. rate 18, height 5\' 2"  (1.575 m), weight 81.3 kg (179 lb 4.8 oz), SpO2 96 %.  PHYSICAL EXAMINATION:  Physical Exam  GENERAL:  82 y.o.-year-old patient lying in the bed with no acute distress.  EYES: Pupils equal, round, reactive to light and accommodation. No scleral icterus. Extraocular muscles intact.  HEENT: Head atraumatic, normocephalic. Oropharynx and nasopharynx clear.  NECK:  Supple, no jugular venous distention. No thyroid enlargement, no tenderness.  LUNGS: Normal breath sounds bilaterally, no wheezing, rales,rhonchi or crepitation. No use of accessory muscles of respiration. Diminished breath sounds at the bases CARDIOVASCULAR: S1, S2 normal. No  rubs, or gallops. 2/6 systolic murmurs present ABDOMEN: Soft, nontender, but distended. Soft Bowel sounds present. No organomegaly or mass.  EXTREMITIES: No pedal edema, cyanosis, or clubbing.  NEUROLOGIC: Cranial nerves II through XII are intact. Muscle strength 5/5 in all extremities. Sensation intact. Gait not checked.  PSYCHIATRIC: The patient is alert and oriented x 3. Intermittent confusion noted. SKIN: No obvious rash, lesion, or ulcer.    LABORATORY PANEL:   CBC  Recent Labs Lab 10/02/16 0140  WBC 11.1*  HGB 15.1  HCT 44.1  PLT 279   ------------------------------------------------------------------------------------------------------------------  Chemistries   Recent Labs Lab 10/02/16 0013  NA 140  K 4.6  CL 98*  CO2 31  GLUCOSE 165*  BUN 15  CREATININE 0.85  CALCIUM 9.8  AST 38  ALT 16  ALKPHOS 58  BILITOT 1.2   ------------------------------------------------------------------------------------------------------------------  Cardiac Enzymes  Recent Labs Lab 10/02/16 0013  TROPONINI <0.03    ------------------------------------------------------------------------------------------------------------------  RADIOLOGY:  Ct Abdomen Pelvis W Contrast  Result Date: 10/02/2016 CLINICAL DATA:  Acute onset of burning sensation after eating. Initial encounter. EXAM: CT ABDOMEN AND PELVIS WITH CONTRAST TECHNIQUE: Multidetector CT imaging of the abdomen and pelvis was performed using the standard protocol following bolus administration of intravenous contrast. CONTRAST:  141mL ISOVUE-300 IOPAMIDOL (ISOVUE-300) INJECTION 61% COMPARISON:  None. FINDINGS: Lower chest: The visualized portions of the mediastinum are unremarkable, aside from scattered coronary artery calcifications. There is a relatively large paraesophageal hernia, filled with fluid and trace air. Hepatobiliary: The liver is unremarkable in appearance. The gallbladder is unremarkable in appearance. The common bile duct remains normal in caliber. Pancreas: There is diffuse fatty infiltration of the pancreas. The pancreas is otherwise unremarkable.No adrenal hemorrhage or renal injury identified. Bladder is unremarkable. Spleen: The spleen is unremarkable in appearance. Adrenals/Urinary Tract: The adrenal glands are unremarkable in appearance. A large 8.2 cm cyst is noted arising at the lateral aspect of the right kidney. The kidneys are otherwise unremarkable. There is no evidence of hydronephrosis. No renal or ureteral stones are identified. No perinephric stranding is seen. Stomach/Bowel: The stomach is unremarkable in appearance. The small bowel is within normal limits. The appendix is not visualized; there is no evidence for appendicitis. Diverticulosis is noted along the sigmoid colon, without evidence of diverticulitis. Vascular/Lymphatic: Scattered calcification is seen along the abdominal aorta and its branches. The abdominal aorta is otherwise grossly unremarkable. The inferior vena cava is grossly unremarkable. No retroperitoneal  lymphadenopathy is seen. No pelvic sidewall lymphadenopathy is identified. Reproductive: The bladder is mildly distended and grossly unremarkable. The patient is status post hysterectomy. No suspicious adnexal masses are seen. Other: No additional soft tissue abnormalities are seen. Musculoskeletal: No acute osseous abnormalities are identified. Facet disease is noted at the lumbar spine. Degenerative change is noted at the pubic symphysis. The visualized musculature is unremarkable in appearance. IMPRESSION: 1. Relatively large paraesophageal hernia. Herniated stomach is filled with fluid and trace air. This may correspond to the patient's symptoms. 2. Scattered coronary artery calcifications. 3. Large right renal cyst noted. 4. Diverticulosis along the sigmoid colon, without evidence of diverticulitis. 5. Scattered aortic atherosclerosis noted. Electronically Signed   By: Garald Balding M.D.   On: 10/02/2016 02:20   Dg Abd Portable 1 View  Result Date: 10/02/2016 CLINICAL DATA:  82 y/o  F; nasogastric tube placement. EXAM: PORTABLE ABDOMEN - 1 VIEW COMPARISON:  10/02/2016 CT of abdomen and pelvis. FINDINGS: Large hiatal hernia. Enteric tube is coiled over the left lower lung zone probably within the large hiatal hernia with tip projecting over the anatomic gastroesophageal junction. Unremarkable bowel gas pattern. Severe rotatory levoscoliosis of the lumbar spine. IMPRESSION: Enteric tube is coiled over the left lower lung zone probably within the large hiatal hernia with tip projecting over the anatomic gastroesophageal junction. Electronically Signed   By: Kristine Garbe M.D.   On: 10/02/2016 03:44    EKG:   Orders placed or performed in visit on 05/24/13  . EKG 12-Lead    ASSESSMENT AND PLAN:   82 year old female with past medical history significant for GERD, arthritis, hyperlipidemia, anxiety presents to hospital secondary to nausea and vomiting.  #1 intractable nausea and  vomiting-secondary to significant hiatal hernia-paraesophageal hernia. -Appreciate cardiothoracic surgery consult. -Agree with NG tube to decompress her stomach. -Recommending EGD prior to any others surgical procedures. However patient is refusing endoscopy at this time. Discussed with her daughter at bedside who is trying to convince the patient. Patient said  that she will think about it and get back to [place] by tomorrow. -Added IV Pepcid and also Protonix for reflux symptoms  #2 hypertension-continue metoprolol low-dose  #3 anxiety-on Xanax when necessary  #4 DVT prophylaxis-we'll start Lovenox   All the records are reviewed and case discussed with Care Management/Social Workerr. Management plans discussed with the patient, family and they are in agreement.  CODE STATUS: DO NOT RESUSCITATE per admission notes  TOTAL TIME TAKING CARE OF THIS PATIENT: 37 minutes.   POSSIBLE D/C IN 1-2 DAYS, DEPENDING ON CLINICAL CONDITION.   Gladstone Lighter M.D on 10/02/2016 at 1:59 PM  Between 7am to 6pm - Pager - [phone]  After 6pm go to [email or web address] - password [email or web address]  Office  [phone]  CC: Primary care physician; Einar Pheasant, MD

## 2016-10-02 NOTE — Progress Notes (Signed)
[name], female   DOB: 1929-12-09, 82 y.o.   MRN: [id number]  Chief Complaint  Patient presents with  . Abdominal Pain    Referred By Dr. Michail Sermon  Reason for Referral Paraesophageal hernia  HPI Location, Quality, Duration, Severity, Timing, Context, Modifying Factors, Associated Signs and Symptoms.  Sandra Reyes is a 82 y.o. female.  Over the last several weeks she's had increasing episodes of nausea and vomiting. The vomiting is been of undigested food. She states that has occurred mostly with high fiber foods such as lettuce and cabbage.  Yesterday she again experienced an episode of abdominal pain nausea and vomiting. She felt that she should have these symptoms evaluated and she came to the emergency department where a CT scan of the abdomen revealed a paraesophageal hernia. She was subsequently treated with placement of a nasogastric tube which has now drained about 800 cc of brown fluid. There is no blood in the nasogastric contents. The patient states that she feels much better now with the insertion of the NG tube and that her abdominal pain is essentially resolved. She does complain of some discomfort from the tube and the back of her throat but overall feels much better. She does have a history of reflux disease. She was on oral medications several years ago but stopped those secondary to increasing prices. She states that she has never been told she had a hiatal hernia in the past. As far she knows she's never been diagnosed with any esophageal pathology although her husband did die of esophageal cancer after several surgical procedures at [facility]. She does live alone and is able to do some of the housework herself. She does use a walker to get around. She appears to be relatively sedentary. She does not complain of any shortness of breath at this time. She has no chest pain.   Past Medical History:  Diagnosis Date  . Anxiety   . Degenerative disc disease   . Depression    . GERD (gastroesophageal reflux disease)   . Hyperlipidemia     Past Surgical History:  Procedure Laterality Date  . ABDOMINAL HYSTERECTOMY  1997   partial  . BREAST SURGERY  1960 to 1970   lumps removed  . CYSTOCELE REPAIR    . RECTOCELE REPAIR      Family History  Problem Relation Age of Onset  . Heart disease Mother   . Arthritis Mother   . Ulcers Father     uremic poisoning  . Arthritis Sister     x 4  . Hyperlipidemia Sister     x 1  . Hypertension Sister   . Cancer Sister     x 2 (ovarian cancer) & 1 (colon cancer)  . Diabetes Sister     Social History Social History  Substance Use Topics  . Smoking status: Never Smoker  . Smokeless tobacco: Never Used  . Alcohol use No    Allergies  Allergen Reactions  . Celexa [Citalopram]   . Effexor [Venlafaxine]   . Hormone Cream Base   . Lipitor [Atorvastatin]   . Macrobid [Nitrofurantoin Macrocrystal]   . Nortriptyline   . Penicillin V Potassium     Has patient had a PCN reaction causing immediate rash, facial/tongue/throat swelling, SOB or lightheadedness with hypotension: Yes Has patient had a PCN reaction causing severe rash involving mucus membranes or skin necrosis: No Has patient had a PCN reaction that required hospitalization No Has patient had a PCN reaction  occurring within the last 10 years: No If all of the above answers are "NO", then may proceed with Cephalosporin use.  . Prozac [Fluoxetine]   . Sulfa Antibiotics     Current Facility-Administered Medications  Medication Dose Route Frequency Provider Last Rate Last Dose  . 0.9 %  sodium chloride infusion   Intravenous Continuous Harrie Foreman, MD 125 mL/hr at 10/02/16 0449    . acetaminophen (TYLENOL) tablet 650 mg  650 mg Oral Q6H PRN Harrie Foreman, MD       Or  . acetaminophen (TYLENOL) suppository 650 mg  650 mg Rectal Q6H PRN Harrie Foreman, MD      . clotrimazole (LOTRIMIN) 1 % cream   Topical BID Harrie Foreman, MD      .  docusate sodium (COLACE) capsule 100 mg  100 mg Oral Daily PRN Harrie Foreman, MD      . iopamidol (ISOVUE-300) 61 % injection 30 mL  30 mL Oral Once PRN Gregor Hams, MD      . metoprolol succinate (TOPROL-XL) 24 hr tablet 12.5 mg  12.5 mg Oral Daily Gladstone Lighter, MD      . ondansetron [facility]) tablet 4 mg  4 mg Oral Q6H PRN Harrie Foreman, MD       Or  . ondansetron [facility]) injection 4 mg  4 mg Intravenous Q6H PRN Harrie Foreman, MD      . prochlorperazine (COMPAZINE) injection 5 mg  5 mg Intravenous Q6H PRN Harrie Foreman, MD      . sodium chloride flush (NS) 0.9 % injection 3 mL  3 mL Intravenous Q12H Harrie Foreman, MD          Review of Systems A complete review of systems was asked and was negative except for the following positive findingsNo weight loss, no vomiting of blood, no anginal pain.  Blood pressure (!) 111/49, pulse 68, temperature 98.6 F (37 C), temperature source Oral, resp. rate 18, height 5\' 2"  (1.575 m), weight 179 lb 4.8 oz (81.3 kg), SpO2 96 %.  Physical Exam CONSTITUTIONAL:  Pleasant, well-developed, well-nourished obese, and in no acute distress. EYES: Pupils equal and reactive to light, Sclera non-icteric EARS, NOSE, MOUTH AND THROAT:  The oropharynx was clear.  Dentition is good repair.  Oral mucosa pink and moist. LYMPH NODES:  Lymph nodes in the neck and axillae were normal RESPIRATORY:  Lungs were clear.  Normal respiratory effort without pathologic use of accessory muscles of respiration CARDIOVASCULAR: Heart was regular without murmurs.  There were no carotid bruits. GI: The abdomen was soft, nontender, and nondistended. There were no palpable masses. There was no hepatosplenomegaly. There were normal bowel sounds in all quadrants. GU:  Rectal deferred.   MUSCULOSKELETAL:  Normal muscle strength and tone.  No clubbing or cyanosis.   SKIN:  There were no pathologic skin lesions.  There were no nodules on palpation. NEUROLOGIC:   Sensation is normal.  Cranial nerves are grossly intact. PSYCH:  Oriented to person, place and time.  Mood and affect are normal.  Data Reviewed CT scan of the abdomen and chest x-ray  I have personally reviewed the patient's imaging, laboratory findings and medical records.    Assessment    Paraesophageal hernia incompletely characterized    Plan    I had a long discussion with the patient regarding her paraesophageal hernia. With the repeated episodes of vomiting over the last several weeks I believe that she would benefit  from further investigation including endoscopy. The patient tells her that she is not inclined to pursue any type of therapy for this. She states that given her advanced age that she does not feel any surgery would be warranted. She is willing to try oral medications but that is about the extent of her desire to pursue any therapy. I explained her that it would appear that the stomach was obstructed and that an endoscopy may give [place] the cause and subsequent treatment. Again the patient stated that she was desirous of just going home.  My current recommendation would be to perform an upper GI endoscopy if the patient is willing to have that done. Based upon those results surgical repair using an laparoscopic approach may be indicated. I do not see any urgent surgical indications at this time.       Nestor Lewandowsky, MD 10/02/2016, 12:06 PM

## 2016-10-02 NOTE — ED Notes (Signed)
EDP requesting this RN call regarding troponin result.  Spoke with Wells Guiles in lab.

## 2016-10-03 ENCOUNTER — Encounter: Payer: Self-pay | Admitting: Anesthesiology

## 2016-10-03 ENCOUNTER — Ambulatory Visit: Admit: 2016-10-03 | Payer: Medicare Other | Admitting: Unknown Physician Specialty

## 2016-10-03 ENCOUNTER — Inpatient Hospital Stay: Payer: Medicare Other | Admitting: Registered Nurse

## 2016-10-03 ENCOUNTER — Encounter
Admission: EM | Disposition: A | Discharge status: Other Acute Inpatient Hospital | Payer: Self-pay | Source: Home / Self Care | Attending: Internal Medicine

## 2016-10-03 HISTORY — PX: ESOPHAGOGASTRODUODENOSCOPY (EGD) WITH PROPOFOL: SHX5813

## 2016-10-03 LAB — BASIC METABOLIC PANEL
Anion gap: 5 (ref 5–15)
BUN: 19 mg/dL (ref 6–20)
CALCIUM: 8.1 mg/dL — AB (ref 8.9–10.3)
CO2: 30 mmol/L (ref 22–32)
CREATININE: 0.71 mg/dL (ref 0.44–1.00)
Chloride: 108 mmol/L (ref 101–111)
GFR calc non Af Amer: >60 mL/min (ref >60)
GLUCOSE: 90 mg/dL (ref 65–99)
Potassium: 3.4 mmol/L — ABNORMAL LOW (ref 3.5–5.1)
Sodium: 143 mmol/L (ref 135–145)

## 2016-10-03 LAB — HEMOGLOBIN A1C
Hgb A1c MFr Bld: 6 % — ABNORMAL HIGH (ref 4.8–5.6)
Mean Plasma Glucose: 126 mg/dL

## 2016-10-03 SURGERY — EGD (ESOPHAGOGASTRODUODENOSCOPY)
Anesthesia: Monitor Anesthesia Care

## 2016-10-03 SURGERY — ESOPHAGOGASTRODUODENOSCOPY (EGD) WITH PROPOFOL
Anesthesia: General

## 2016-10-03 MED ORDER — POTASSIUM CHLORIDE CRYS ER 20 MEQ PO TBCR
40.0000 meq | EXTENDED_RELEASE_TABLET | Freq: Once | ORAL | Status: AC
  Administered 2016-10-03: 40 meq via ORAL
  Filled 2016-10-03: qty 2

## 2016-10-03 MED ORDER — PROPOFOL 10 MG/ML IV BOLUS
INTRAVENOUS | Status: DC | PRN
  Administered 2016-10-03: 50 mg via INTRAVENOUS
  Administered 2016-10-03: 10 mg via INTRAVENOUS

## 2016-10-03 MED ORDER — LIDOCAINE HCL (CARDIAC) 20 MG/ML IV SOLN
INTRAVENOUS | Status: DC | PRN
  Administered 2016-10-03: 40 mg via INTRAVENOUS

## 2016-10-03 MED ORDER — PROPOFOL 500 MG/50ML IV EMUL
INTRAVENOUS | Status: DC | PRN
  Administered 2016-10-03: 100 ug/kg/min via INTRAVENOUS

## 2016-10-03 NOTE — Op Note (Signed)
[facility] Gastroenterology Patient Name: Sandra Reyes Procedure Date: 10/03/2016 1:31 PM MRN: [id number] Account #: [id number] Date of Birth: Mar 03, 1930 Admit Type: Inpatient Age: 82 Room: [facility] ENDO ROOM 4 Gender: Female Note Status: Finalized Procedure:            Upper GI endoscopy Indications:          Abnormal CT of the GI tract Providers:            Manya Silvas, MD Medicines:            Propofol per Anesthesia Complications:        No immediate complications. Procedure:            Pre-Anesthesia Assessment:                       - After reviewing the risks and benefits, the patient                        was deemed in satisfactory condition to undergo the                        procedure.                       After obtaining informed consent, the endoscope was                        passed under direct vision. Throughout the procedure,                        the patient's blood pressure, pulse, and oxygen                        saturations were monitored continuously. The Endoscope                        was introduced through the mouth, and advanced to the                        body of the stomach. The upper GI endoscopy was                        performed with difficulty due to abnormal anatomy. The                        patient tolerated the procedure. Findings:      Few superficial linear esophageal ulcers with no bleeding and no       stigmata of recent bleeding were found as well as the NG tube.. The       scope was advanced into the stomach but due to J shape stomach possibly       from her paraesophageal hernia I could not advance any further than the       tip of the NG tube. So I could not advance beyond what appered to be the       proximal stomach despite multiple attempts. some mucosal erythematous       areas in the hernia. Impression:           - Non-bleeding esophageal ulcers.                       -  No specimens  collected. Recommendation:       - The findings and recommendations were discussed with                        the patient's family. will tell Dr. Genevive Bi of results. Manya Silvas, MD 10/03/2016 1:50:35 PM This report has been signed electronically. Number of Addenda: 0 Note Initiated On: 10/03/2016 1:31 PM      [facility]

## 2016-10-03 NOTE — Anesthesia Procedure Notes (Signed)
Date/Time: 10/03/2016 1:33 PM Performed by: Doreen Salvage Pre-anesthesia Checklist: Patient identified, Emergency Drugs available, Suction available and Patient being monitored Patient Re-evaluated:Patient Re-evaluated prior to inductionOxygen Delivery Method: Nasal cannula Intubation Type: IV induction Dental Injury: Teeth and Oropharynx as per pre-operative assessment  Comments: Nasal cannula with etCO2 monitoring

## 2016-10-03 NOTE — Consult Note (Signed)
Patient appears to have obstruction of the stomach due to paraesophageal hernia.  Dr. Genevive Bi aware.  Discussed with patient and family.

## 2016-10-03 NOTE — Plan of Care (Signed)
Problem: Pain Managment: Goal: General experience of comfort will improve Outcome: Progressing Pt denies pain.   Problem: Fluid Volume: Goal: Ability to maintain a balanced intake and output will improve Outcome: Not Progressing Pt had an EGD today. Continue NG tube to LIS. NPO except ice chips and sips with meds. IVF infusing. Up to bsc.  Problem: Nutrition: Goal: Adequate nutrition will be maintained Outcome: Not Progressing As above.

## 2016-10-03 NOTE — Consult Note (Signed)
GI Inpatient Consult Note  Reason for Consult: paraesophageal hernia    Attending Requesting Consult: Dr. Tressia Miners   History of Present Illness: Sandra Reyes is a 82 y.o. female seen for evaluation of paraesophageal hernia  at the request of Dr. Tressia Miners. PMHx of anxiety, GERD, HTN. She reports over the past 1 month developing dysphagia with regurgitation of foods about 1x/week. She denies any classic GERD symptoms, has not been on PPI at home. She denies any volume regurgitation until past 1 month. Symptoms acutely worsened 2 days ago when she developed nausea/vomiting at home with significant epigastric and chest pain which prompted her to go to ED. Had significant amount of output via NG yesterday but only about 100cc today and chest pressure has resolved.   She has baseline constipation which is well controlled with a stool softener. She denies any lower abdominal pain, rectal bleeding, melena. She denies any NSAID use, blood thinners, etc.   Last Colonoscopy: none prior Last Endoscopy: none prior  Per chart review - barium swallow - 2000- large HH with esophageal ring which causes tablet to dissolve after 30 minutes to pass into stomach.   Past Medical History:  Past Medical History:  Diagnosis Date  . Anxiety   . Degenerative disc disease   . Depression   . GERD (gastroesophageal reflux disease)   . Hyperlipidemia     Problem List: Patient Active Problem List   Diagnosis Date Noted  . Diaphragmatic hernia 10/02/2016  . Back skin lesion 03/15/2016  . GERD (gastroesophageal reflux disease) 09/08/2015  . Hyperglycemia 09/08/2015  . Health care maintenance 03/10/2015  . Peripheral neuropathy ([place]) 02/18/2014  . Vision changes 02/18/2014  . Chronic back pain 05/06/2013  . Anxiety 05/06/2013  . Essential hypertension, benign 05/04/2013  . Hypercholesterolemia 05/04/2013    Past Surgical History: Past Surgical History:  Procedure Laterality Date  . ABDOMINAL HYSTERECTOMY   1997   partial  . BREAST SURGERY  1960 to 1970   lumps removed  . CYSTOCELE REPAIR    . RECTOCELE REPAIR      Allergies: Allergies  Allergen Reactions  . Celexa [Citalopram]   . Effexor [Venlafaxine]   . Hormone Cream Base   . Lipitor [Atorvastatin]   . Macrobid [Nitrofurantoin Macrocrystal]   . Nortriptyline   . Penicillin V Potassium     Has patient had a PCN reaction causing immediate rash, facial/tongue/throat swelling, SOB or lightheadedness with hypotension: Yes Has patient had a PCN reaction causing severe rash involving mucus membranes or skin necrosis: No Has patient had a PCN reaction that required hospitalization No Has patient had a PCN reaction occurring within the last 10 years: No If all of the above answers are "NO", then may proceed with Cephalosporin use.  . Prozac [Fluoxetine]   . Sulfa Antibiotics     Home Medications: Prescriptions Prior to Admission  Medication Sig Dispense Refill Last Dose  . Alpha-Lipoic Acid 300 MG TABS Take 1 tablet (300 mg total) by mouth daily. 90 tablet 1 10/01/2016 at Unknown time  . ALPRAZolam (XANAX) 0.5 MG tablet TAKE 1 TABLET BY MOUTH AT BEDTIME & 1/2 TABLET DURING THE DAY 45 tablet 1 10/01/2016 at Unknown time  . aspirin 81 MG tablet Take 81 mg by mouth daily.   10/01/2016 at Unknown time  . metoprolol succinate (TOPROL-XL) 25 MG 24 hr tablet Take one-half tablet by  mouth twice a day 90 tablet 1 10/01/2016 at Unknown time  . Multiple Vitamin (MULTIVITAMIN) tablet Take 1  tablet by mouth daily.   10/01/2016 at Unknown time  . vitamin C (ASCORBIC ACID) 250 MG tablet Take 250 mg by mouth daily.   10/01/2016 at Unknown time  . vitamin E 400 UNIT capsule Take 400 Units by mouth daily.   10/01/2016 at Unknown time   Home medication reconciliation was completed with the patient.   Scheduled Inpatient Medications:   . clotrimazole   Topical BID  . enoxaparin (LOVENOX) injection  40 mg Subcutaneous Q24H  . famotidine (PEPCID) IV  20 mg  Intravenous Q12H  . metoprolol succinate  12.5 mg Oral Daily  . pantoprazole  40 mg Oral Daily  . sodium chloride flush  3 mL Intravenous Q12H    Continuous Inpatient Infusions:   . sodium chloride 75 mL/hr at 10/02/16 1454    PRN Inpatient Medications:  acetaminophen **OR** acetaminophen, ALPRAZolam, docusate sodium, iopamidol, ondansetron **OR** ondansetron (ZOFRAN) IV, prochlorperazine  Family History: family history includes Arthritis in her mother and sister; Cancer in her sister; Diabetes in her sister; Heart disease in her mother; Hyperlipidemia in her sister; Hypertension in her sister; Ulcers in her father.    Social History:   reports that she has never smoked. She has never used smokeless tobacco. She reports that she does not drink alcohol or use drugs.    Review of Systems: Constitutional: Weight is stable.  Eyes: No changes in vision. ENT: No oral lesions, sore throat.  GI: see HPI.  Heme/Lymph: No easy bruising.  CV: No chest pain.  GU: No hematuria.  Integumentary: No rashes.  Neuro: No headaches.  Psych: No depression/anxiety.  Endocrine: No heat/cold intolerance.  Allergic/Immunologic: No urticaria.  Resp: No cough, SOB.  Musculoskeletal: No joint swelling.    Physical Examination: BP (!) 136/51 (BP Location: Left Arm)   Pulse 64   Temp 98.2 F (36.8 C) (Oral)   Resp 18   Ht 5\' 2"  (1.575 m)   Wt 81.8 kg (180 lb 4.8 oz)   SpO2 94%   BMI 32.98 kg/m  Gen: NAD, alert and oriented x 4 HEENT: PEERLA, EOMI, Neck: supple, no JVD or thyromegaly Chest: CTA bilaterally, no wheezes, crackles, or other adventitious sounds CV: RRR, no m/g/c/r Abd: soft, NT, ND, +BS in all four quadrants; no HSM, guarding, ridigity, or rebound tenderness Ext: no edema, well perfused with 2+ pulses, Skin: no rash or lesions noted Lymph: no LAD  Data: Lab Results  Component Value Date   WBC 11.1 (H) 10/02/2016   HGB 15.1 10/02/2016   HCT 44.1 10/02/2016   MCV 90.6  10/02/2016   PLT 279 10/02/2016    Recent Labs Lab 10/02/16 0140  HGB 15.1   Lab Results  Component Value Date   NA 143 10/03/2016   K 3.4 (L) 10/03/2016   CL 108 10/03/2016   CO2 30 10/03/2016   BUN 19 10/03/2016   CREATININE 0.71 10/03/2016   GLU 99 04/06/2012   Lab Results  Component Value Date   ALT 16 10/02/2016   AST 38 10/02/2016   ALKPHOS 58 10/02/2016   BILITOT 1.2 10/02/2016   No results for input(s): APTT, INR, PTT in the last 168 hours.    Assessment/Plan: Ms. Ostrow is a 82 y.o. female admitted for nausea/vomiting, regurgitation, abdominal pain. CT w/ large paraesophageal hernia. Has never had EGD/colonsocopy. Needs EGD prior to possible paraesopahgeal hernia. Will arrange EGD for later this afternoon.   Recommendations:  1. Keep NPO until EGD  2. EGD w/ Dr. Vira Agar.  Case discussed w/ Dr. Vira Agar.    Thank you for the consult. Please call with questions or concerns.  Laurine Blazer, PA-C [place]

## 2016-10-03 NOTE — Anesthesia Preprocedure Evaluation (Addendum)
Anesthesia Evaluation  Patient identified by MRN, date of birth, ID band Patient awake    Reviewed: Allergy & Precautions, NPO status , Patient's Chart, lab work & pertinent test results, reviewed documented beta blocker date and time   Airway Mallampati: II  TM Distance: <3 FB     Dental  (+) Caps   Pulmonary    Pulmonary exam normal        Cardiovascular hypertension, Normal cardiovascular exam     Neuro/Psych Anxiety Depression Peripheral neuropathy  Neuromuscular disease    GI/Hepatic Neg liver ROS, GERD  ,  Endo/Other  negative endocrine ROS  Renal/GU negative Renal ROS  negative genitourinary   Musculoskeletal  (+) Arthritis ,   Abdominal Normal abdominal exam  (+)   Peds negative pediatric ROS (+)  Hematology negative hematology ROS (+)   Anesthesia Other Findings   Reproductive/Obstetrics                            Anesthesia Physical Anesthesia Plan  ASA: III and emergent  Anesthesia Plan: General   Post-op Pain Management:    Induction: Intravenous  Airway Management Planned: Nasal Cannula  Additional Equipment:   Intra-op Plan:   Post-operative Plan:   Informed Consent: I have reviewed the patients History and Physical, chart, labs and discussed the procedure including the risks, benefits and alternatives for the proposed anesthesia with the patient or authorized representative who has indicated his/her understanding and acceptance.   Dental advisory given  Plan Discussed with: CRNA and Surgeon  Anesthesia Plan Comments:         Anesthesia Quick Evaluation

## 2016-10-03 NOTE — Progress Notes (Signed)
[facility] [place] NAME: Ranasia Massarelli    MR#:  [id number]  DATE OF BIRTH:  09-26-1930  SUBJECTIVE:  CHIEF COMPLAINT:   Chief Complaint  Patient presents with  . Abdominal Pain   -No further drainage from NG tube. Feels much better. -For EGD this afternoon.  family at bedside.  REVIEW OF SYSTEMS:  Review of Systems  Constitutional: Negative for chills, fever and malaise/fatigue.  HENT: Negative for ear discharge, ear pain and nosebleeds.   Eyes: Negative for blurred vision and double vision.  Respiratory: Negative for cough, shortness of breath and wheezing.   Cardiovascular: Negative for chest pain, palpitations and leg swelling.  Gastrointestinal: Positive for nausea. Negative for abdominal pain, constipation, diarrhea and vomiting.  Genitourinary: Negative for dysuria.  Musculoskeletal: Negative for myalgias and neck pain.  Neurological: Negative for dizziness, sensory change, speech change, focal weakness, seizures and headaches.  Psychiatric/Behavioral: Negative for depression.    DRUG ALLERGIES:   Allergies  Allergen Reactions  . Celexa [Citalopram]   . Effexor [Venlafaxine]   . Hormone Cream Base   . Lipitor [Atorvastatin]   . Macrobid [Nitrofurantoin Macrocrystal]   . Nortriptyline   . Penicillin V Potassium     Has patient had a PCN reaction causing immediate rash, facial/tongue/throat swelling, SOB or lightheadedness with hypotension: Yes Has patient had a PCN reaction causing severe rash involving mucus membranes or skin necrosis: No Has patient had a PCN reaction that required hospitalization No Has patient had a PCN reaction occurring within the last 10 years: No If all of the above answers are "NO", then may proceed with Cephalosporin use.  . Prozac [Fluoxetine]   . Sulfa Antibiotics     VITALS:  Blood pressure (!) 146/65, pulse 69, temperature 98.4 F (36.9 C), temperature source Oral, resp. rate 18,  height 5\' 2"  (1.575 m), weight 81.8 kg (180 lb 4.8 oz), SpO2 97 %.  PHYSICAL EXAMINATION:  Physical Exam  GENERAL:  82 y.o.-year-old patient lying in the bed with no acute distress.  EYES: Pupils equal, round, reactive to light and accommodation. No scleral icterus. Extraocular muscles intact.  HEENT: Head atraumatic, normocephalic. Oropharynx and nasopharynx clear. NG tube in place. NECK:  Supple, no jugular venous distention. No thyroid enlargement, no tenderness.  LUNGS: Normal breath sounds bilaterally, no wheezing, rales,rhonchi or crepitation. No use of accessory muscles of respiration. Diminished breath sounds at the bases CARDIOVASCULAR: S1, S2 normal. No  rubs, or gallops. 2/6 systolic murmurs present ABDOMEN: Soft, nontender, but distended. Soft Bowel sounds present. No organomegaly or mass.  EXTREMITIES: No pedal edema, cyanosis, or clubbing.  NEUROLOGIC: Cranial nerves II through XII are intact. Muscle strength 5/5 in all extremities. Sensation intact. Gait not checked.  PSYCHIATRIC: The patient is alert and oriented x 3. Intermittent confusion noted. SKIN: No obvious rash, lesion, or ulcer.    LABORATORY PANEL:   CBC  Recent Labs Lab 10/02/16 0140  WBC 11.1*  HGB 15.1  HCT 44.1  PLT 279   ------------------------------------------------------------------------------------------------------------------  Chemistries   Recent Labs Lab 10/02/16 0013 10/03/16 0343  NA 140 143  K 4.6 3.4*  CL 98* 108  CO2 31 30  GLUCOSE 165* 90  BUN 15 19  CREATININE 0.85 0.71  CALCIUM 9.8 8.1*  AST 38  --   ALT 16  --   ALKPHOS 58  --   BILITOT 1.2  --    ------------------------------------------------------------------------------------------------------------------  Cardiac Enzymes  Recent Labs Lab 10/02/16  0013  TROPONINI <0.03   ------------------------------------------------------------------------------------------------------------------  RADIOLOGY:  Ct  Abdomen Pelvis W Contrast  Result Date: 10/02/2016 CLINICAL DATA:  Acute onset of burning sensation after eating. Initial encounter. EXAM: CT ABDOMEN AND PELVIS WITH CONTRAST TECHNIQUE: Multidetector CT imaging of the abdomen and pelvis was performed using the standard protocol following bolus administration of intravenous contrast. CONTRAST:  158mL ISOVUE-300 IOPAMIDOL (ISOVUE-300) INJECTION 61% COMPARISON:  None. FINDINGS: Lower chest: The visualized portions of the mediastinum are unremarkable, aside from scattered coronary artery calcifications. There is a relatively large paraesophageal hernia, filled with fluid and trace air. Hepatobiliary: The liver is unremarkable in appearance. The gallbladder is unremarkable in appearance. The common bile duct remains normal in caliber. Pancreas: There is diffuse fatty infiltration of the pancreas. The pancreas is otherwise unremarkable.No adrenal hemorrhage or renal injury identified. Bladder is unremarkable. Spleen: The spleen is unremarkable in appearance. Adrenals/Urinary Tract: The adrenal glands are unremarkable in appearance. A large 8.2 cm cyst is noted arising at the lateral aspect of the right kidney. The kidneys are otherwise unremarkable. There is no evidence of hydronephrosis. No renal or ureteral stones are identified. No perinephric stranding is seen. Stomach/Bowel: The stomach is unremarkable in appearance. The small bowel is within normal limits. The appendix is not visualized; there is no evidence for appendicitis. Diverticulosis is noted along the sigmoid colon, without evidence of diverticulitis. Vascular/Lymphatic: Scattered calcification is seen along the abdominal aorta and its branches. The abdominal aorta is otherwise grossly unremarkable. The inferior vena cava is grossly unremarkable. No retroperitoneal lymphadenopathy is seen. No pelvic sidewall lymphadenopathy is identified. Reproductive: The bladder is mildly distended and grossly  unremarkable. The patient is status post hysterectomy. No suspicious adnexal masses are seen. Other: No additional soft tissue abnormalities are seen. Musculoskeletal: No acute osseous abnormalities are identified. Facet disease is noted at the lumbar spine. Degenerative change is noted at the pubic symphysis. The visualized musculature is unremarkable in appearance. IMPRESSION: 1. Relatively large paraesophageal hernia. Herniated stomach is filled with fluid and trace air. This may correspond to the patient's symptoms. 2. Scattered coronary artery calcifications. 3. Large right renal cyst noted. 4. Diverticulosis along the sigmoid colon, without evidence of diverticulitis. 5. Scattered aortic atherosclerosis noted. Electronically Signed   By: Garald Balding M.D.   On: 10/02/2016 02:20   Dg Abd Portable 1 View  Result Date: 10/02/2016 CLINICAL DATA:  82 y/o  F; nasogastric tube placement. EXAM: PORTABLE ABDOMEN - 1 VIEW COMPARISON:  10/02/2016 CT of abdomen and pelvis. FINDINGS: Large hiatal hernia. Enteric tube is coiled over the left lower lung zone probably within the large hiatal hernia with tip projecting over the anatomic gastroesophageal junction. Unremarkable bowel gas pattern. Severe rotatory levoscoliosis of the lumbar spine. IMPRESSION: Enteric tube is coiled over the left lower lung zone probably within the large hiatal hernia with tip projecting over the anatomic gastroesophageal junction. Electronically Signed   By: Kristine Garbe M.D.   On: 10/02/2016 03:44    EKG:   Orders placed or performed in visit on 05/24/13  . EKG 12-Lead    ASSESSMENT AND PLAN:   82 year old female with past medical history significant for GERD, arthritis, hyperlipidemia, anxiety presents to hospital secondary to nausea and vomiting.  #1 Intractable nausea and vomiting-secondary to significant hiatal hernia-paraesophageal hernia. -Appreciate cardiothoracic surgery consult. - has NG tube to  decompress her stomach. - Adding EGD done as recommended by the cardiothoracic surgeon today to rule out any distal end of stomach obstruction. Based on  these results, plan for laparoscopic repair of her hiatal hernia. -Added IV Pepcid and also Protonix for reflux symptoms  #2 hypertension-continue metoprolol low-dose  #3 anxiety-on Xanax when necessary  #4 DVT prophylaxis-on Lovenox     All the records are reviewed and case discussed with Care Management/Social Workerr. Management plans discussed with the patient, family and they are in agreement.  CODE STATUS: DO NOT RESUSCITATE per admission notes  TOTAL TIME TAKING CARE OF THIS PATIENT: 37 minutes.   POSSIBLE D/C IN 1-2 DAYS, DEPENDING ON CLINICAL CONDITION.   Gladstone Lighter M.D on 10/03/2016 at 1:34 PM  Between 7am to 6pm - Pager - [phone]  After 6pm go to [email or web address] - password [email or web address] [facility]  Office  [phone]  CC: Primary care physician; Einar Pheasant, MD

## 2016-10-03 NOTE — Anesthesia Postprocedure Evaluation (Signed)
Anesthesia Post Note  Patient: Sandra Reyes  Procedure(s) Performed: Procedure(s) (LRB): ESOPHAGOGASTRODUODENOSCOPY (EGD) WITH PROPOFOL (N/A)  Patient location during evaluation: PACU Anesthesia Type: General Level of consciousness: awake and alert and oriented Pain management: pain level controlled Vital Signs Assessment: post-procedure vital signs reviewed and stable Respiratory status: spontaneous breathing Cardiovascular status: blood pressure returned to baseline Anesthetic complications: no    Last Vitals:  Vitals:   10/03/16 1406 10/03/16 1416  BP: (!) 145/54 137/82  Pulse: 64   Resp:  16  Temp:      Last Pain:  Vitals:   10/03/16 1132  TempSrc: Oral  PainSc:                  Dalaina Tates

## 2016-10-03 NOTE — Transfer of Care (Signed)
Immediate Anesthesia Transfer of Care Note  Patient: Sandra Reyes  Procedure(s) Performed: Procedure(s): ESOPHAGOGASTRODUODENOSCOPY (EGD) WITH PROPOFOL (N/A)  Patient Location: PACU  Anesthesia Type:General  Level of Consciousness: sedated  Airway & Oxygen Therapy: Patient Spontanous Breathing and Patient connected to face mask oxygen  Post-op Assessment: Report given to RN and Post -op Vital signs reviewed and stable  Post vital signs: Reviewed and stable  Last Vitals:  Vitals:   10/03/16 1132 10/03/16 1346  BP: (!) 146/65 113/89  Pulse: 69   Resp: 18 12  Temp: 123456 C     Complications: No apparent anesthesia complications

## 2016-10-04 DIAGNOSIS — Z79899 Other long term (current) drug therapy: Secondary | ICD-10-CM | POA: Diagnosis not present

## 2016-10-04 DIAGNOSIS — M4690 Unspecified inflammatory spondylopathy, site unspecified: Secondary | ICD-10-CM | POA: Diagnosis not present

## 2016-10-04 DIAGNOSIS — Z8679 Personal history of other diseases of the circulatory system: Secondary | ICD-10-CM | POA: Diagnosis not present

## 2016-10-04 DIAGNOSIS — Z743 Need for continuous supervision: Secondary | ICD-10-CM | POA: Diagnosis not present

## 2016-10-04 DIAGNOSIS — E785 Hyperlipidemia, unspecified: Secondary | ICD-10-CM | POA: Diagnosis not present

## 2016-10-04 DIAGNOSIS — R109 Unspecified abdominal pain: Secondary | ICD-10-CM | POA: Diagnosis not present

## 2016-10-04 DIAGNOSIS — K440 Diaphragmatic hernia with obstruction, without gangrene: Secondary | ICD-10-CM | POA: Diagnosis not present

## 2016-10-04 DIAGNOSIS — K3189 Other diseases of stomach and duodenum: Secondary | ICD-10-CM | POA: Diagnosis not present

## 2016-10-04 DIAGNOSIS — Z4682 Encounter for fitting and adjustment of non-vascular catheter: Secondary | ICD-10-CM | POA: Diagnosis not present

## 2016-10-04 DIAGNOSIS — R918 Other nonspecific abnormal finding of lung field: Secondary | ICD-10-CM | POA: Diagnosis not present

## 2016-10-04 DIAGNOSIS — Z882 Allergy status to sulfonamides status: Secondary | ICD-10-CM | POA: Diagnosis not present

## 2016-10-04 DIAGNOSIS — R112 Nausea with vomiting, unspecified: Secondary | ICD-10-CM | POA: Diagnosis not present

## 2016-10-04 DIAGNOSIS — K449 Diaphragmatic hernia without obstruction or gangrene: Secondary | ICD-10-CM | POA: Diagnosis not present

## 2016-10-04 DIAGNOSIS — K219 Gastro-esophageal reflux disease without esophagitis: Secondary | ICD-10-CM | POA: Diagnosis not present

## 2016-10-04 DIAGNOSIS — Z880 Allergy status to penicillin: Secondary | ICD-10-CM | POA: Diagnosis not present

## 2016-10-04 DIAGNOSIS — R269 Unspecified abnormalities of gait and mobility: Secondary | ICD-10-CM | POA: Diagnosis not present

## 2016-10-04 DIAGNOSIS — R1310 Dysphagia, unspecified: Secondary | ICD-10-CM | POA: Diagnosis not present

## 2016-10-04 DIAGNOSIS — G629 Polyneuropathy, unspecified: Secondary | ICD-10-CM | POA: Diagnosis not present

## 2016-10-04 DIAGNOSIS — I10 Essential (primary) hypertension: Secondary | ICD-10-CM | POA: Diagnosis not present

## 2016-10-04 DIAGNOSIS — M545 Low back pain: Secondary | ICD-10-CM | POA: Diagnosis not present

## 2016-10-04 DIAGNOSIS — J90 Pleural effusion, not elsewhere classified: Secondary | ICD-10-CM | POA: Diagnosis not present

## 2016-10-04 DIAGNOSIS — M1990 Unspecified osteoarthritis, unspecified site: Secondary | ICD-10-CM | POA: Diagnosis not present

## 2016-10-04 DIAGNOSIS — Z7982 Long term (current) use of aspirin: Secondary | ICD-10-CM | POA: Diagnosis not present

## 2016-10-04 LAB — BASIC METABOLIC PANEL
Anion gap: 5 (ref 5–15)
BUN: 17 mg/dL (ref 6–20)
CALCIUM: 7.8 mg/dL — AB (ref 8.9–10.3)
CHLORIDE: 110 mmol/L (ref 101–111)
CO2: 24 mmol/L (ref 22–32)
CREATININE: 0.72 mg/dL (ref 0.44–1.00)
Glucose, Bld: 78 mg/dL (ref 65–99)
Potassium: 3.6 mmol/L (ref 3.5–5.1)
SODIUM: 139 mmol/L (ref 135–145)

## 2016-10-04 NOTE — Care Management Important Message (Signed)
Important Message  Patient Details  Name: [name] MRN: [id number] Date of Birth: Aug 21, 1930   Medicare Important Message Given:  Yes    Chancelor Hardrick A, RN 10/04/2016, 1:09 PM

## 2016-10-04 NOTE — Progress Notes (Signed)
Report called to [facility] at [facility]

## 2016-10-04 NOTE — Progress Notes (Signed)
Chart reviewed situation discussed with the patient her family and Dr. Genevive Bi. The clinical evaluation has been performed. I have reviewed the CAT scan and her endoscopy reports.  This woman has a near complete obstruction of her upper GI system secondary to her paraesophageal hernia. We would not be comfortable performing surgery on this woman to our facility. Talk with the patient about potential transfer. I've contacted to transfer service at [facility] at the patient's request. We are on the list for potential transfer and the facility will be contacted when a bed is available. I talk with Dr. Rivka Barbara at [facility]

## 2016-10-04 NOTE — Progress Notes (Signed)
Pt will be transferred to [facility] when a bed is available per Dr Pat Patrick. Pt may have sweet tea

## 2016-10-04 NOTE — Progress Notes (Signed)
[place] at [place] NAME: Sandra Reyes    MR#:  [id number]  DATE OF BIRTH:  July 04, 1930  SUBJECTIVE:  CHIEF COMPLAINT:   Chief Complaint  Patient presents with  . Abdominal Pain   -NG tube with light brownish discharge today on drainage. On low intermittent suction. - awaiting surgical input  REVIEW OF SYSTEMS:  Review of Systems  Constitutional: Negative for chills, fever and malaise/fatigue.  HENT: Negative for ear discharge, ear pain and nosebleeds.   Eyes: Negative for blurred vision and double vision.  Respiratory: Negative for cough, shortness of breath and wheezing.   Cardiovascular: Negative for chest pain, palpitations and leg swelling.  Gastrointestinal: Positive for nausea. Negative for abdominal pain, constipation, diarrhea and vomiting.  Genitourinary: Negative for dysuria.  Musculoskeletal: Negative for myalgias and neck pain.  Neurological: Negative for dizziness, sensory change, speech change, focal weakness, seizures and headaches.  Psychiatric/Behavioral: Negative for depression.    DRUG ALLERGIES:   Allergies  Allergen Reactions  . Celexa [Citalopram]   . Effexor [Venlafaxine]   . Hormone Cream Base   . Lipitor [Atorvastatin]   . Macrobid [Nitrofurantoin Macrocrystal]   . Nortriptyline   . Penicillin V Potassium     Has patient had a PCN reaction causing immediate rash, facial/tongue/throat swelling, SOB or lightheadedness with hypotension: Yes Has patient had a PCN reaction causing severe rash involving mucus membranes or skin necrosis: No Has patient had a PCN reaction that required hospitalization No Has patient had a PCN reaction occurring within the last 10 years: No If all of the above answers are "NO", then may proceed with Cephalosporin use.  . Prozac [Fluoxetine]   . Sulfa Antibiotics     VITALS:  Blood pressure (!) 150/63, pulse (!) 58, temperature 98.4 F (36.9 C), temperature source Oral,  resp. rate 18, height 5\' 2"  (1.575 m), weight 82 kg (180 lb 12.8 oz), SpO2 97 %.  PHYSICAL EXAMINATION:  Physical Exam  GENERAL:  82 y.o.-year-old patient lying in the bed with no acute distress. Very pleasant EYES: Pupils equal, round, reactive to light and accommodation. No scleral icterus. Extraocular muscles intact.  HEENT: Head atraumatic, normocephalic. Oropharynx and nasopharynx clear. NG tube in place. NECK:  Supple, no jugular venous distention. No thyroid enlargement, no tenderness.  LUNGS: Normal breath sounds bilaterally, no wheezing, rales,rhonchi or crepitation. No use of accessory muscles of respiration. Diminished breath sounds at the bases CARDIOVASCULAR: S1, S2 normal. No  rubs, or gallops. 2/6 systolic murmurs present ABDOMEN: Soft, nontender, but distended. Soft Bowel sounds present. No organomegaly or mass.  EXTREMITIES: No pedal edema, cyanosis, or clubbing.  NEUROLOGIC: Cranial nerves II through XII are intact. Muscle strength 5/5 in all extremities. Sensation intact. Gait not checked.  PSYCHIATRIC: The patient is alert and oriented x 3. Intermittent confusion noted. SKIN: No obvious rash, lesion, or ulcer.    LABORATORY PANEL:   CBC  Recent Labs Lab 10/02/16 0140  WBC 11.1*  HGB 15.1  HCT 44.1  PLT 279   ------------------------------------------------------------------------------------------------------------------  Chemistries   Recent Labs Lab 10/02/16 0013  10/04/16 0324  NA 140  < > 139  K 4.6  < > 3.6  CL 98*  < > 110  CO2 31  < > 24  GLUCOSE 165*  < > 78  BUN 15  < > 17  CREATININE 0.85  < > 0.72  CALCIUM 9.8  < > 7.8*  AST 38  --   --  ALT 16  --   --   ALKPHOS 58  --   --   BILITOT 1.2  --   --   < > = values in this interval not displayed. ------------------------------------------------------------------------------------------------------------------  Cardiac Enzymes  Recent Labs Lab 10/02/16 0013  TROPONINI <0.03    ------------------------------------------------------------------------------------------------------------------  RADIOLOGY:  No results found.  EKG:   Orders placed or performed in visit on 05/24/13  . EKG 12-Lead    ASSESSMENT AND PLAN:   82 year old female with past medical history significant for GERD, arthritis, hyperlipidemia, anxiety presents to hospital secondary to nausea and vomiting.  #1 Intractable nausea and vomiting-secondary to significant hiatal hernia-paraesophageal hernia. -Appreciate cardiothoracic surgery consult.Awaiting further input from them - has NG tube to decompress her stomach. -  EGD done as recommended by the cardiothoracic surgeon showing possible distal obstruction of the stomach. Will need surgical repair. Also laparoscopic repair of her hiatal hernia. -Added IV Pepcid and also Protonix for reflux symptoms  #2 hypertension-continue metoprolol low-dose  #3 anxiety-on Xanax when necessary  #4 DVT prophylaxis-on Lovenox  Awaiting surgical input   All the records are reviewed and case discussed with Care Management/Social Workerr. Management plans discussed with the patient, family and they are in agreement.  CODE STATUS: DO NOT RESUSCITATE per admission notes  TOTAL TIME TAKING CARE OF THIS PATIENT: 37 minutes.   POSSIBLE D/C IN 1-2 DAYS, DEPENDING ON CLINICAL CONDITION.   Gladstone Lighter M.D on 10/04/2016 at 11:42 AM  Between 7am to 6pm - Pager - [phone]  After 6pm go to [email or web address] - password [email or web address] [facility]  Office  [phone]  CC: Primary care physician; Einar Pheasant, MD

## 2016-10-04 NOTE — Plan of Care (Signed)
Problem: Pain Managment: Goal: General experience of comfort will improve Outcome: Progressing Pt denies pain  Problem: Nutrition: Goal: Adequate nutrition will be maintained Outcome: Not Progressing Pt is NPO except ice has NGT to LIS

## 2016-10-04 NOTE — Consult Note (Signed)
Patient with stable VS, has NG tube in, requests salin gargle, I ordered this with her nurse.  Taking in ice chips to help with irritation.  No new suggestions. Chest clear anterior fields, abd neg.  Await Dr. Genevive Bi plans.

## 2016-10-05 ENCOUNTER — Encounter: Payer: Self-pay | Admitting: Unknown Physician Specialty

## 2016-10-05 NOTE — Discharge Summary (Signed)
[facility] [place] NAME: Sandra Reyes    MR#:  [id number]  DATE OF BIRTH:  07-19-1930  DATE OF ADMISSION:  10/01/2016   ADMITTING PHYSICIAN: Harrie Foreman, MD  DATE OF DISCHARGE: 10/04/2016  7:30 PM Transferred to [place]: Einar Pheasant, MD   ADMISSION DIAGNOSIS:   [facility] - Epigastric pain pre-op paraesophageal hernia repair gi bleed  DISCHARGE DIAGNOSIS:   Active Problems:   Diaphragmatic hernia   SECONDARY DIAGNOSIS:   Past Medical History:  Diagnosis Date  . Anxiety   . Degenerative disc disease   . Depression   . GERD (gastroesophageal reflux disease)   . Hyperlipidemia     HOSPITAL COURSE:   82 year old female with past medical history significant for GERD, arthritis, hyperlipidemia, anxiety presents to hospital secondary to nausea and vomiting.  #1 Intractable nausea and vomiting-secondary to significant hiatal hernia-paraesophageal hernia. -Appreciate cardiothoracic surgery consult. - has NG tube to decompress her stomach. -  EGD done as recommended by the cardiothoracic surgeon showing possible distal near complete obstruction of the stomach. Will need surgical repair.  -IV Pepcid and also Protonix for reflux symptoms - to perform the surgery- patient was transferred to [facility]. Appreciate Dr. Rolin Barry help.  #2 hypertension-continue metoprolol low-dose  #3 anxiety-on Xanax when necessary   Patient transferred to [place]:   Guarded  CONSULTS OBTAINED:   Surgical consultation by Dr. Pat Patrick Dr. Genevive Bi GI consultation by Dr. Tiffany Kocher  DRUG ALLERGIES:   Allergies  Allergen Reactions  . Celexa [Citalopram]   . Effexor [Venlafaxine]   . Hormone Cream Base   . Lipitor [Atorvastatin]   . Macrobid [Nitrofurantoin Macrocrystal]   . Nortriptyline   . Penicillin V Potassium     Has patient had a PCN reaction causing immediate  rash, facial/tongue/throat swelling, SOB or lightheadedness with hypotension: Yes Has patient had a PCN reaction causing severe rash involving mucus membranes or skin necrosis: No Has patient had a PCN reaction that required hospitalization No Has patient had a PCN reaction occurring within the last 10 years: No If all of the above answers are "NO", then may proceed with Cephalosporin use.  . Prozac [Fluoxetine]   . Sulfa Antibiotics    DISCHARGE MEDICATIONS:     Medication List    ASK your doctor about these medications   Alpha-Lipoic Acid 300 MG Tabs Take 1 tablet (300 mg total) by mouth daily.   ALPRAZolam 0.5 MG tablet Commonly known as:  XANAX TAKE 1 TABLET BY MOUTH AT BEDTIME & 1/2 TABLET DURING THE DAY   aspirin 81 MG tablet Take 81 mg by mouth daily.   metoprolol succinate 25 MG 24 hr tablet Commonly known as:  TOPROL-XL Take one-half tablet by  mouth twice a day   multivitamin tablet Take 1 tablet by mouth daily.   vitamin C 250 MG tablet Commonly known as:  ASCORBIC ACID Take 250 mg by mouth daily.   vitamin E 400 UNIT capsule Take 400 Units by mouth daily.        DISCHARGE INSTRUCTIONS:   Patient being transferred to [facility]  DIET:   NPO  ACTIVITY:   Bedrest  OXYGEN:   Home Oxygen: No.  Oxygen Delivery: room air  DISCHARGE LOCATION:   [facility]   If you experience worsening of your admission symptoms, develop shortness of breath, life threatening emergency, suicidal or homicidal thoughts you must seek  medical attention immediately by calling 911 or calling your MD immediately  if symptoms less severe.  You Must read complete instructions/literature along with all the possible adverse reactions/side effects for all the Medicines you take and that have been prescribed to you. Take any new Medicines after you have completely understood and accpet all the possible adverse reactions/side effects.   Please note  You were cared for  by a hospitalist during your hospital stay. If you have any questions about your discharge medications or the care you received while you were in the hospital after you are discharged, you can call the unit and asked to speak with the hospitalist on call if the hospitalist that took care of you is not available. Once you are discharged, your primary care physician will handle any further medical issues. Please note that NO REFILLS for any discharge medications will be authorized once you are discharged, as it is imperative that you return to your primary care physician (or establish a relationship with a primary care physician if you do not have one) for your aftercare needs so that they can reassess your need for medications and monitor your lab values.    On the day of Discharge:  VITAL SIGNS:   Blood pressure (!) 150/69, pulse 64, temperature 98.3 F (36.8 C), temperature source Oral, resp. rate 18, height 5\' 2"  (1.575 m), weight 82 kg (180 lb 12.8 oz), SpO2 91 %.  PHYSICAL EXAMINATION:    GENERAL:  82 y.o.-year-old patient lying in the bed with no acute distress. Very pleasant EYES: Pupils equal, round, reactive to light and accommodation. No scleral icterus. Extraocular muscles intact.  HEENT: Head atraumatic, normocephalic. Oropharynx and nasopharynx clear. NG tube in place. NECK:  Supple, no jugular venous distention. No thyroid enlargement, no tenderness.  LUNGS: Normal breath sounds bilaterally, no wheezing, rales,rhonchi or crepitation. No use of accessory muscles of respiration. Diminished breath sounds at the bases CARDIOVASCULAR: S1, S2 normal. No  rubs, or gallops. 2/6 systolic murmurs present ABDOMEN: Soft, nontender, but distended. Soft Bowel sounds present. No organomegaly or mass.  EXTREMITIES: No pedal edema, cyanosis, or clubbing.  NEUROLOGIC: Cranial nerves II through XII are intact. Muscle strength 5/5 in all extremities. Sensation intact. Gait not checked.  PSYCHIATRIC:  The patient is alert and oriented x 3. Intermittent confusion noted. SKIN: No obvious rash, lesion, or ulcer.   DATA REVIEW:   CBC  Recent Labs Lab 10/02/16 0140  WBC 11.1*  HGB 15.1  HCT 44.1  PLT 279    Chemistries   Recent Labs Lab 10/02/16 0013  10/04/16 0324  NA 140  < > 139  K 4.6  < > 3.6  CL 98*  < > 110  CO2 31  < > 24  GLUCOSE 165*  < > 78  BUN 15  < > 17  CREATININE 0.85  < > 0.72  CALCIUM 9.8  < > 7.8*  AST 38  --   --   ALT 16  --   --   ALKPHOS 58  --   --   BILITOT 1.2  --   --   < > = values in this interval not displayed.   Microbiology Results  No results found for this or any previous visit.  RADIOLOGY:  No results found.   Management plans discussed with the patient, family and they are in agreement.  CODE STATUS:  Code Status History    Date Active Date Inactive Code Status Order ID Comments  User Context   10/02/2016  4:33 AM 10/04/2016 11:12 PM DNR [id number]  Harrie Foreman, MD ED   10/02/2016  4:19 AM 10/02/2016  4:33 AM Full Code [id number]  Harrie Foreman, MD ED    Questions for Most Recent Historical Code Status (Order [id number])    Question Answer Comment   In the event of cardiac or respiratory ARREST Do not call a "code blue"    In the event of cardiac or respiratory ARREST Do not perform Intubation, CPR, defibrillation or ACLS    In the event of cardiac or respiratory ARREST Use medication by any route, position, wound care, and other measures to relive pain and suffering. May use oxygen, suction and manual treatment of airway obstruction as needed for comfort.       TOTAL TIME TAKING CARE OF THIS PATIENT: 37 minutes.    Gladstone Lighter M.D on 10/05/2016 at 2:07 PM  Between 7am to 6pm - Pager - [phone]  After 6pm go to [email or web address] - Proofreader  [facility]  Office  [phone]  CC: Primary care physician; Einar Pheasant, MD   Note: This dictation was prepared with  Dragon dictation along with smaller phrase technology. Any transcriptional errors that result from this process are unintentional.

## 2016-10-12 ENCOUNTER — Telehealth: Payer: Self-pay | Admitting: Internal Medicine

## 2016-10-12 NOTE — Telephone Encounter (Signed)
Pt called and stated that she needs a 2 week post surgical visit. Pt's surgeon wanted her to follow up with PCP. She stated that the surgery was to move her stomach from her chest and untwist it. Please advise as to where to schedule. Caryl Pina wanted to know if 11/29 @ 1 pm would be ok.   Call pt @ [phone]

## 2016-10-12 NOTE — Telephone Encounter (Signed)
Please advise 

## 2016-10-12 NOTE — Telephone Encounter (Signed)
I am ok with this date, if pt ok.

## 2016-10-12 NOTE — Telephone Encounter (Signed)
Called pt and pt confirmed appt on 11/29 @ 1

## 2016-10-20 ENCOUNTER — Telehealth: Payer: Self-pay | Admitting: Internal Medicine

## 2016-10-20 NOTE — Telephone Encounter (Signed)
fyi

## 2016-10-20 NOTE — Telephone Encounter (Signed)
[facility]  Patient Name: Sandra Reyes  DOB: 1930-02-19    Initial Comment Caller states she had surgery 1.5-2 wks ago, PICC line leaking with odor. Surgery was for stomach twisting up and going up into esophagus.    Nurse Assessment  Nurse: Wayne Sever, RN, Tillie Rung Date/Time (Eastern Time): 10/20/2016 9:25:46 AM  Confirm and document reason for call. If symptomatic, describe symptoms. You must click the next button to save text entered. ---Caller states she had surgery about 2 weeks. She states it was abdominal surgery. After verifying with patient this is not a PICC line, it's a feeding tube in the stomach and she flushes it with water daily. She states it's red around the tube. She wants to know if the tube needs to be checked due to redness and leaking a tiny bit of drainage. Denies any fever  Does the patient have any new or worsening symptoms? ---Yes  Will a triage be completed? ---Yes  Related visit to physician within the last 2 weeks? ---No  Does the PT have any chronic conditions? (i.e. diabetes, asthma, etc.) ---Yes  List chronic conditions. ---HTN  Is this a behavioral health or substance abuse call? ---No     Guidelines    Guideline Title Affirmed Question Affirmed Notes  Feeding Tube Symptoms and Questions [1] Leakage from tube site in abdominal wall AND [2] small amount (all triage questions negative)    Final Disposition User   Williamson, RN, Tillie Rung    Disagree/Comply: Comply

## 2016-10-20 NOTE — Telephone Encounter (Signed)
Pt called about the tube in her stomach that's now leaking around the edge. Pt cleans with a q-tip. Pt is concerned about it leaking and a little red. Pt states a little odor. Pt was transferred to [facility]. Thank you!  Call pt @ [phone].

## 2016-10-28 ENCOUNTER — Ambulatory Visit: Payer: Medicare Other | Admitting: Internal Medicine

## 2016-10-29 DIAGNOSIS — Z4889 Encounter for other specified surgical aftercare: Secondary | ICD-10-CM | POA: Diagnosis not present

## 2016-10-29 DIAGNOSIS — K3189 Other diseases of stomach and duodenum: Secondary | ICD-10-CM | POA: Diagnosis not present

## 2016-11-05 DIAGNOSIS — I10 Essential (primary) hypertension: Secondary | ICD-10-CM | POA: Diagnosis not present

## 2016-11-05 DIAGNOSIS — Z431 Encounter for attention to gastrostomy: Secondary | ICD-10-CM | POA: Diagnosis not present

## 2016-11-05 DIAGNOSIS — K219 Gastro-esophageal reflux disease without esophagitis: Secondary | ICD-10-CM | POA: Diagnosis not present

## 2016-11-05 DIAGNOSIS — K9422 Gastrostomy infection: Secondary | ICD-10-CM | POA: Diagnosis not present

## 2016-11-05 DIAGNOSIS — Z4803 Encounter for change or removal of drains: Secondary | ICD-10-CM | POA: Diagnosis not present

## 2016-11-09 DIAGNOSIS — Z431 Encounter for attention to gastrostomy: Secondary | ICD-10-CM | POA: Diagnosis not present

## 2016-11-09 DIAGNOSIS — I10 Essential (primary) hypertension: Secondary | ICD-10-CM | POA: Diagnosis not present

## 2016-11-09 DIAGNOSIS — K9422 Gastrostomy infection: Secondary | ICD-10-CM | POA: Diagnosis not present

## 2016-11-09 DIAGNOSIS — K219 Gastro-esophageal reflux disease without esophagitis: Secondary | ICD-10-CM | POA: Diagnosis not present

## 2016-11-09 DIAGNOSIS — Z4803 Encounter for change or removal of drains: Secondary | ICD-10-CM | POA: Diagnosis not present

## 2016-11-12 DIAGNOSIS — K9422 Gastrostomy infection: Secondary | ICD-10-CM | POA: Diagnosis not present

## 2016-11-12 DIAGNOSIS — Z431 Encounter for attention to gastrostomy: Secondary | ICD-10-CM | POA: Diagnosis not present

## 2016-11-12 DIAGNOSIS — I10 Essential (primary) hypertension: Secondary | ICD-10-CM | POA: Diagnosis not present

## 2016-11-12 DIAGNOSIS — Z4803 Encounter for change or removal of drains: Secondary | ICD-10-CM | POA: Diagnosis not present

## 2016-11-12 DIAGNOSIS — K219 Gastro-esophageal reflux disease without esophagitis: Secondary | ICD-10-CM | POA: Diagnosis not present

## 2016-11-17 DIAGNOSIS — Z4889 Encounter for other specified surgical aftercare: Secondary | ICD-10-CM | POA: Diagnosis not present

## 2016-11-17 DIAGNOSIS — K3189 Other diseases of stomach and duodenum: Secondary | ICD-10-CM | POA: Diagnosis not present

## 2016-11-18 ENCOUNTER — Telehealth: Payer: Self-pay | Admitting: Internal Medicine

## 2016-11-18 DIAGNOSIS — K9422 Gastrostomy infection: Secondary | ICD-10-CM | POA: Diagnosis not present

## 2016-11-18 DIAGNOSIS — Z431 Encounter for attention to gastrostomy: Secondary | ICD-10-CM | POA: Diagnosis not present

## 2016-11-18 DIAGNOSIS — K219 Gastro-esophageal reflux disease without esophagitis: Secondary | ICD-10-CM | POA: Diagnosis not present

## 2016-11-18 DIAGNOSIS — Z4803 Encounter for change or removal of drains: Secondary | ICD-10-CM | POA: Diagnosis not present

## 2016-11-18 DIAGNOSIS — I10 Essential (primary) hypertension: Secondary | ICD-10-CM | POA: Diagnosis not present

## 2016-11-18 MED ORDER — ALPRAZOLAM 0.5 MG PO TABS: ORAL_TABLET | ORAL | 1 refills | Status: DC

## 2016-11-18 NOTE — Telephone Encounter (Signed)
Please advise 

## 2016-11-18 NOTE — Telephone Encounter (Signed)
ok'd rx for xanax #45 with one refill.

## 2016-11-18 NOTE — Telephone Encounter (Signed)
Pt called about needing a refill for her ALPRAZolam Duanne Moron) 0.5 MG tablet  [facility]acy is [id number] - [place], [place]  Call pt @ [phone]. Thank you!

## 2016-11-19 NOTE — Telephone Encounter (Signed)
RX faxed

## 2016-11-20 DIAGNOSIS — Z431 Encounter for attention to gastrostomy: Secondary | ICD-10-CM | POA: Diagnosis not present

## 2016-11-20 DIAGNOSIS — I10 Essential (primary) hypertension: Secondary | ICD-10-CM | POA: Diagnosis not present

## 2016-11-20 DIAGNOSIS — Z4803 Encounter for change or removal of drains: Secondary | ICD-10-CM | POA: Diagnosis not present

## 2016-11-20 DIAGNOSIS — K219 Gastro-esophageal reflux disease without esophagitis: Secondary | ICD-10-CM | POA: Diagnosis not present

## 2016-11-20 DIAGNOSIS — K9422 Gastrostomy infection: Secondary | ICD-10-CM | POA: Diagnosis not present

## 2016-12-02 ENCOUNTER — Other Ambulatory Visit: Payer: Self-pay | Admitting: Internal Medicine

## 2016-12-24 DIAGNOSIS — K449 Diaphragmatic hernia without obstruction or gangrene: Secondary | ICD-10-CM | POA: Diagnosis not present

## 2016-12-24 DIAGNOSIS — K562 Volvulus: Secondary | ICD-10-CM | POA: Diagnosis not present

## 2016-12-24 DIAGNOSIS — K219 Gastro-esophageal reflux disease without esophagitis: Secondary | ICD-10-CM | POA: Diagnosis not present

## 2016-12-24 DIAGNOSIS — Z9889 Other specified postprocedural states: Secondary | ICD-10-CM | POA: Diagnosis not present

## 2016-12-31 DIAGNOSIS — K449 Diaphragmatic hernia without obstruction or gangrene: Secondary | ICD-10-CM | POA: Insufficient documentation

## 2017-01-26 ENCOUNTER — Telehealth: Payer: Self-pay | Admitting: Internal Medicine

## 2017-01-26 NOTE — Telephone Encounter (Signed)
Pt called needing a refill for ALPRAZolam (XANAX) 0.5 MG tablet.  [id number] [place], [place]  Call pt @ [phone]. Thank you!

## 2017-01-27 NOTE — Telephone Encounter (Signed)
Patient called back states that she has had surgery in November. She is aware that she needs app in April for physical but she has second surgery that may be scheduled for that month. She wanted to know if you need to see her before you can fill.

## 2017-01-27 NOTE — Telephone Encounter (Signed)
Go ahead and schedule her an appt for after planned surgery (in May ?) and ok to refill until appt.

## 2017-01-27 NOTE — Telephone Encounter (Signed)
Medication: Xanax 0.5mg   Directions:TAKE 1 TABLET BY MOUTH AT BEDTIME & 1/2 TABLET DURING THE DAY Last given: 11/18/16 #45 Number refills: 1 Last o/v: 03/12/16 Follow up: not at this time. Called pt to make app for f/u no answer no v/m

## 2017-01-29 MED ORDER — ALPRAZOLAM 0.5 MG PO TABS: ORAL_TABLET | ORAL | 1 refills | Status: DC

## 2017-01-29 NOTE — Telephone Encounter (Signed)
rx ok'd for xanax #30 with one refill  

## 2017-01-29 NOTE — Telephone Encounter (Signed)
pls advise

## 2017-01-29 NOTE — Telephone Encounter (Signed)
ok 

## 2017-01-29 NOTE — Telephone Encounter (Signed)
App has been made ok to refill

## 2017-01-29 NOTE — Telephone Encounter (Signed)
FYI Pt has been scheduled in June, she felt comfortable with scheduling that month

## 2017-02-01 NOTE — Telephone Encounter (Signed)
Faxed

## 2017-02-02 DIAGNOSIS — K449 Diaphragmatic hernia without obstruction or gangrene: Secondary | ICD-10-CM | POA: Diagnosis not present

## 2017-02-18 ENCOUNTER — Telehealth: Payer: Self-pay | Admitting: Internal Medicine

## 2017-02-18 NOTE — Telephone Encounter (Signed)
Pt declined AWV. °

## 2017-03-18 ENCOUNTER — Telehealth: Payer: Self-pay | Admitting: *Deleted

## 2017-03-18 NOTE — Telephone Encounter (Signed)
Left message to return call to our office.  

## 2017-03-18 NOTE — Telephone Encounter (Signed)
She has been released by surgeon and does not have GI doctor. Do I need to make her a f/u app with you?

## 2017-03-18 NOTE — Telephone Encounter (Signed)
I do not mind her changing to zantac 150mg  q day (take 30 minutes before breakfast), but I am not sure that her symptoms are related to prilosec.  If having acute dizziness, needs to be evaluated.  The zantac is over the counter.

## 2017-03-18 NOTE — Telephone Encounter (Signed)
If she feels is related to the prilosec and wants to change, she needs to let GI know and they can change.  If acute issues, etc, - needs evaluation.

## 2017-03-18 NOTE — Telephone Encounter (Signed)
Called patient she states she had been put on omeprazole from the GI doctor that did her surgery. She states that she is having dizziness and joint pain. She would like to try generic Zantac to mail order.

## 2017-03-18 NOTE — Telephone Encounter (Signed)
Patient has requested a call to discuss a switch in medication for acid reflux  Pt contact [phone]

## 2017-03-22 NOTE — Telephone Encounter (Signed)
Patient states that if we call in the Ranitidine it will be covered for free from her insurance if we do  90 day supply? Is that ok?

## 2017-03-23 ENCOUNTER — Telehealth: Payer: Self-pay | Admitting: *Deleted

## 2017-03-23 ENCOUNTER — Other Ambulatory Visit: Payer: Self-pay

## 2017-03-23 MED ORDER — RANITIDINE HCL 150 MG PO TABS: 150.0000 mg | ORAL_TABLET | Freq: Every day | ORAL | 1 refills | Status: DC

## 2017-03-23 NOTE — Telephone Encounter (Signed)
Spoke with patient addressed issue [facility] already on file.

## 2017-03-23 NOTE — Telephone Encounter (Signed)
Spoke with patient she states she need 90 day script sent into [facility].  Script re-sent to [facility].   Patient advised.    Advised if she continues to have dizziness needs to be evaluated patient verbalized an understanding.

## 2017-03-23 NOTE — Telephone Encounter (Signed)
rx sent in for zantac.  If problems with dizziness, will need to be evaluated.  See previous note.

## 2017-03-23 NOTE — Telephone Encounter (Signed)
Please requested a call  to discuss the proper pharmacy that is on file . She has had trouble with receiving her Rx  Pt contact  [phone]

## 2017-04-02 DIAGNOSIS — H2511 Age-related nuclear cataract, right eye: Secondary | ICD-10-CM | POA: Diagnosis not present

## 2017-04-27 ENCOUNTER — Telehealth: Payer: Self-pay | Admitting: Internal Medicine

## 2017-04-27 NOTE — Telephone Encounter (Signed)
Pt called requesting a refill on her ALPRAZolam (XANAX) 0.5 MG tablet. Please advise, thank you! ° °Pharmacy - [id number] [place], [place] - [address] [facility] [address] °

## 2017-04-28 MED ORDER — ALPRAZOLAM 0.5 MG PO TABS: ORAL_TABLET | ORAL | 0 refills | Status: DC

## 2017-04-28 NOTE — Telephone Encounter (Signed)
ok'd one refill for xanax.  Inform her to keep appt.

## 2017-04-28 NOTE — Telephone Encounter (Signed)
Medication: Xanax 0.5mg  Directions: 1 tab QHS and 1/2 during day  Last given: 01-29-17 #30 Number refills: 1 Last o/v: 03/12/16 Follow up: 05/13/17 Pt had f/u on 10/28/17 that was c/a due to app at [facility]

## 2017-05-12 ENCOUNTER — Ambulatory Visit: Payer: Medicare Other | Admitting: Internal Medicine

## 2017-05-13 ENCOUNTER — Encounter: Payer: Self-pay | Admitting: Internal Medicine

## 2017-05-13 ENCOUNTER — Ambulatory Visit (INDEPENDENT_AMBULATORY_CARE_PROVIDER_SITE_OTHER): Payer: Medicare Other | Admitting: Internal Medicine

## 2017-05-13 VITALS — BP 124/62 | HR 60 | Temp 98.6°F | Resp 12 | Ht 61.02 in | Wt 186.2 lb

## 2017-05-13 DIAGNOSIS — K449 Diaphragmatic hernia without obstruction or gangrene: Secondary | ICD-10-CM | POA: Diagnosis not present

## 2017-05-13 DIAGNOSIS — F419 Anxiety disorder, unspecified: Secondary | ICD-10-CM | POA: Diagnosis not present

## 2017-05-13 DIAGNOSIS — I10 Essential (primary) hypertension: Secondary | ICD-10-CM

## 2017-05-13 DIAGNOSIS — M549 Dorsalgia, unspecified: Secondary | ICD-10-CM | POA: Diagnosis not present

## 2017-05-13 DIAGNOSIS — G8929 Other chronic pain: Secondary | ICD-10-CM | POA: Diagnosis not present

## 2017-05-13 DIAGNOSIS — Z0000 Encounter for general adult medical examination without abnormal findings: Secondary | ICD-10-CM

## 2017-05-13 DIAGNOSIS — E7800 Pure hypercholesterolemia, unspecified: Secondary | ICD-10-CM

## 2017-05-13 DIAGNOSIS — G6289 Other specified polyneuropathies: Secondary | ICD-10-CM | POA: Diagnosis not present

## 2017-05-13 DIAGNOSIS — M25552 Pain in left hip: Secondary | ICD-10-CM

## 2017-05-13 DIAGNOSIS — R739 Hyperglycemia, unspecified: Secondary | ICD-10-CM | POA: Diagnosis not present

## 2017-05-13 DIAGNOSIS — K219 Gastro-esophageal reflux disease without esophagitis: Secondary | ICD-10-CM

## 2017-05-13 DIAGNOSIS — I700 Atherosclerosis of aorta: Secondary | ICD-10-CM

## 2017-05-13 NOTE — Progress Notes (Signed)
Pre-visit discussion using our clinic review tool. No additional management support is needed unless otherwise documented below in the visit note.  

## 2017-05-13 NOTE — Progress Notes (Signed)
[name], female   DOB: [birth year], 83 y.o.   MRN: [id number]   Subjective:    [name], female    DOB: 1930-05-21, 83 y.o.   MRN: [id number]  HPI  Patient here for a physical exam.  She is s/p reduction of gastric volvulus and gastropexy with g tube placement on 10/07/16.  G tube was removed on 12/24/16.  Did well with surgery.  She feels better.  Heart palpitations are better.  No chest pain.  Breathing stable.  No increased sob.  Swallowing ok.  No abdominal pain.  Bowels moving.  Having some pain in left hip.  Changing mattress helped.  Desires no further intervention.     Past Medical History:  Diagnosis Date  . Anxiety   . Degenerative disc disease   . Depression   . GERD (gastroesophageal reflux disease)   . Hyperlipidemia    Past Surgical History:  Procedure Laterality Date  . ABDOMINAL HYSTERECTOMY  1997   partial  . BREAST SURGERY  1960 to 1970   lumps removed  . CYSTOCELE REPAIR    . ESOPHAGOGASTRODUODENOSCOPY (EGD) WITH PROPOFOL N/A 10/03/2016   Procedure: ESOPHAGOGASTRODUODENOSCOPY (EGD) WITH PROPOFOL;  Surgeon: Manya Silvas, MD;  Location: [facility] ENDOSCOPY;  Service: Endoscopy;  Laterality: N/A;  . RECTOCELE REPAIR     Family History  Problem Relation Age of Onset  . Heart disease Mother   . Arthritis Mother   . Ulcers Father        uremic poisoning  . Arthritis Sister        x 4  . Hyperlipidemia Sister        x 1  . Hypertension Sister   . Cancer Sister        x 2 (ovarian cancer) & 1 (colon cancer)  . Diabetes Sister    Social History   Social History  . Marital status: Married    Spouse name: N/A  . Number of children: N/A  . Years of education: N/A   Social History Main Topics  . Smoking status: Never Smoker  . Smokeless tobacco: Never Used  . Alcohol use No  . Drug use: No  . Sexual activity: Not Asked   Other Topics Concern  . None   Social History Narrative  . None    Outpatient Encounter  Prescriptions as of 05/13/2017  Medication Sig  . Alpha-Lipoic Acid 300 MG TABS Take 1 tablet (300 mg total) by mouth daily.  Marland Kitchen ALPRAZolam (XANAX) 0.5 MG tablet TAKE 1 TABLET BY MOUTH AT BEDTIME & 1/2 TABLET DURING THE DAY  . aspirin 81 MG tablet Take 81 mg by mouth daily.  . metoprolol succinate (TOPROL-XL) 25 MG 24 hr tablet TAKE ONE-HALF TABLET BY  MOUTH TWICE A DAY  . Multiple Vitamin (MULTIVITAMIN) tablet Take 1 tablet by mouth daily.  . ranitidine (ZANTAC) 150 MG tablet Take 1 tablet (150 mg total) by mouth daily. Take 30 minutes before breakfast.  . vitamin C (ASCORBIC ACID) 250 MG tablet Take 250 mg by mouth daily.  . vitamin E 400 UNIT capsule Take 400 Units by mouth daily.   No facility-administered encounter medications on file as of 05/13/2017.     Review of Systems  Constitutional: Negative for appetite change and unexpected weight change.  HENT: Negative for congestion and sinus pressure.   Eyes: Negative for pain and visual disturbance.  Respiratory: Negative for cough, chest tightness and shortness of breath.   Cardiovascular:  Negative for chest pain, palpitations and leg swelling.  Gastrointestinal: Negative for abdominal pain, diarrhea, nausea and vomiting.  Genitourinary: Negative for difficulty urinating and dysuria.  Musculoskeletal: Negative for back pain and joint swelling.  Skin: Negative for color change and rash.  Neurological: Negative for dizziness, light-headedness and headaches.  Hematological: Negative for adenopathy. Does not bruise/bleed easily.  Psychiatric/Behavioral: Negative for agitation and dysphoric mood.       Objective:    Physical Exam  Constitutional: She is oriented to person, place, and time. She appears well-developed and well-nourished. No distress.  HENT:  Nose: Nose normal.  Mouth/Throat: Oropharynx is clear and moist.  Eyes: Right eye exhibits no discharge. Left eye exhibits no discharge. No scleral icterus.  Neck: Neck supple. No  thyromegaly present.  Cardiovascular: Normal rate and regular rhythm.   Pulmonary/Chest: Breath sounds normal. No accessory muscle usage. No tachypnea. No respiratory distress. She has no decreased breath sounds. She has no wheezes. She has no rhonchi. Right breast exhibits no inverted nipple, no mass, no nipple discharge and no tenderness (no axillary adenopathy). Left breast exhibits no inverted nipple, no mass, no nipple discharge and no tenderness (no axilarry adenopathy).  Abdominal: Soft. Bowel sounds are normal. There is no tenderness.  Musculoskeletal: She exhibits no edema or tenderness.  Lymphadenopathy:    She has no cervical adenopathy.  Neurological: She is alert and oriented to person, place, and time.  Skin: Skin is warm. No rash noted. No erythema.  Psychiatric: She has a normal mood and affect. Her behavior is normal.    BP 124/62 (BP Location: Left Arm, Patient Position: Sitting, Cuff Size: Normal)   Pulse 60   Temp 98.6 F (37 C) (Oral)   Resp 12   Ht 5' 1.02" (1.55 m)   Wt 186 lb 3.2 oz (84.5 kg)   SpO2 97%   BMI 35.15 kg/m  Wt Readings from Last 3 Encounters:  05/13/17 186 lb 3.2 oz (84.5 kg)  10/04/16 180 lb 12.8 oz (82 kg)  03/12/16 184 lb 6 oz (83.6 kg)     Lab Results  Component Value Date   WBC 11.1 (H) 10/02/2016   HGB 15.1 10/02/2016   HCT 44.1 10/02/2016   PLT 279 10/02/2016   GLUCOSE 78 10/04/2016   CHOL 227 (H) 03/12/2016   TRIG 126.0 03/12/2016   HDL 46.10 03/12/2016   LDLCALC 156 (H) 03/12/2016   ALT 16 10/02/2016   AST 38 10/02/2016   NA 139 10/04/2016   K 3.6 10/04/2016   CL 110 10/04/2016   CREATININE 0.72 10/04/2016   BUN 17 10/04/2016   CO2 24 10/04/2016   TSH 1.193 10/02/2016   HGBA1C 6.0 (H) 10/02/2016    Ct Abdomen Pelvis W Contrast  Result Date: 10/02/2016 CLINICAL DATA:  Acute onset of burning sensation after eating. Initial encounter. EXAM: CT ABDOMEN AND PELVIS WITH CONTRAST TECHNIQUE: Multidetector CT imaging of the  abdomen and pelvis was performed using the standard protocol following bolus administration of intravenous contrast. CONTRAST:  154m ISOVUE-300 IOPAMIDOL (ISOVUE-300) INJECTION 61% COMPARISON:  None. FINDINGS: Lower chest: The visualized portions of the mediastinum are unremarkable, aside from scattered coronary artery calcifications. There is a relatively large paraesophageal hernia, filled with fluid and trace air. Hepatobiliary: The liver is unremarkable in appearance. The gallbladder is unremarkable in appearance. The common bile duct remains normal in caliber. Pancreas: There is diffuse fatty infiltration of the pancreas. The pancreas is otherwise unremarkable.No adrenal hemorrhage or renal injury identified. Bladder is  unremarkable. Spleen: The spleen is unremarkable in appearance. Adrenals/Urinary Tract: The adrenal glands are unremarkable in appearance. A large 8.2 cm cyst is noted arising at the lateral aspect of the right kidney. The kidneys are otherwise unremarkable. There is no evidence of hydronephrosis. No renal or ureteral stones are identified. No perinephric stranding is seen. Stomach/Bowel: The stomach is unremarkable in appearance. The small bowel is within normal limits. The appendix is not visualized; there is no evidence for appendicitis. Diverticulosis is noted along the sigmoid colon, without evidence of diverticulitis. Vascular/Lymphatic: Scattered calcification is seen along the abdominal aorta and its branches. The abdominal aorta is otherwise grossly unremarkable. The inferior vena cava is grossly unremarkable. No retroperitoneal lymphadenopathy is seen. No pelvic sidewall lymphadenopathy is identified. Reproductive: The bladder is mildly distended and grossly unremarkable. The patient is status post hysterectomy. No suspicious adnexal masses are seen. Other: No additional soft tissue abnormalities are seen. Musculoskeletal: No acute osseous abnormalities are identified. Facet disease  is noted at the lumbar spine. Degenerative change is noted at the pubic symphysis. The visualized musculature is unremarkable in appearance. IMPRESSION: 1. Relatively large paraesophageal hernia. Herniated stomach is filled with fluid and trace air. This may correspond to the patient's symptoms. 2. Scattered coronary artery calcifications. 3. Large right renal cyst noted. 4. Diverticulosis along the sigmoid colon, without evidence of diverticulitis. 5. Scattered aortic atherosclerosis noted.       Assessment & Plan:   Problem List Items Addressed This Visit    Anxiety    Doing better.  Not requiring xanax as often.  Follow.        Aortic atherosclerosis ([place])    Consider statin therapy.  Check lipid panel.        Chronic back pain    Stable.        Diaphragmatic hernia    S/p surgery.  Doing well.  Feels better.  Follow.        Essential hypertension, benign    Blood pressure under good control.  Continue same medication regimen.  Follow pressures.  Follow metabolic panel.        Relevant Orders   CBC with Differential/Platelet   Basic metabolic panel   GERD (gastroesophageal reflux disease)    No acid reflux.  Follow.        Health care maintenance    Physical today 05/13/17.  Declines mammogram.  Declines colon evaluation.        Hypercholesterolemia    Low cholesterol diet and exercise.  Follow lipid panel.        Relevant Orders   Hepatic function panel   Lipid panel   Hyperglycemia    Low carb diet and exercise.  Follow met b and a1c.        Relevant Orders   Hemoglobin A1c   Peripheral neuropathy    Stable.  Taking alpha lipoic acid.         Other Visit Diagnoses    Routine general medical examination at a health care facility    -  Primary   Left hip pain       desires no further intervention at this time.  changing her mattress helped.         Einar Pheasant, MD

## 2017-05-13 NOTE — Assessment & Plan Note (Signed)
Physical today 05/13/17.  Declines mammogram.  Declines colon evaluation.

## 2017-05-15 ENCOUNTER — Encounter: Payer: Self-pay | Admitting: Internal Medicine

## 2017-05-15 DIAGNOSIS — I700 Atherosclerosis of aorta: Secondary | ICD-10-CM | POA: Insufficient documentation

## 2017-05-15 NOTE — Assessment & Plan Note (Signed)
Consider statin therapy.  Check lipid panel.

## 2017-05-15 NOTE — Assessment & Plan Note (Signed)
Low carb diet and exercise.  Follow met b and a1c.   

## 2017-05-15 NOTE — Assessment & Plan Note (Signed)
Stable.  Taking alpha lipoic acid.

## 2017-05-15 NOTE — Assessment & Plan Note (Signed)
Low cholesterol diet and exercise.  Follow lipid panel.   

## 2017-05-15 NOTE — Assessment & Plan Note (Addendum)
Doing better.  Not requiring xanax as often.  Follow.

## 2017-05-15 NOTE — Assessment & Plan Note (Signed)
Stable

## 2017-05-15 NOTE — Assessment & Plan Note (Signed)
S/p surgery.  Doing well.  Feels better.  Follow.

## 2017-05-15 NOTE — Assessment & Plan Note (Signed)
Blood pressure under good control.  Continue same medication regimen.  Follow pressures.  Follow metabolic panel.   

## 2017-05-15 NOTE — Assessment & Plan Note (Signed)
No acid reflux.  Follow.  

## 2017-06-04 ENCOUNTER — Other Ambulatory Visit (INDEPENDENT_AMBULATORY_CARE_PROVIDER_SITE_OTHER): Payer: Medicare Other

## 2017-06-04 DIAGNOSIS — I10 Essential (primary) hypertension: Secondary | ICD-10-CM

## 2017-06-04 DIAGNOSIS — E7800 Pure hypercholesterolemia, unspecified: Secondary | ICD-10-CM

## 2017-06-04 DIAGNOSIS — R739 Hyperglycemia, unspecified: Secondary | ICD-10-CM

## 2017-06-04 LAB — HEPATIC FUNCTION PANEL
ALT: 10 U/L (ref 0–35)
AST: 15 U/L (ref 0–37)
Albumin: 3.9 g/dL (ref 3.5–5.2)
Alkaline Phosphatase: 53 U/L (ref 39–117)
BILIRUBIN DIRECT: 0.1 mg/dL (ref 0.0–0.3)
BILIRUBIN TOTAL: 0.4 mg/dL (ref 0.2–1.2)
Total Protein: 6.8 g/dL (ref 6.0–8.3)

## 2017-06-04 LAB — LIPID PANEL
CHOL/HDL RATIO: 5
Cholesterol: 238 mg/dL — ABNORMAL HIGH (ref 0–200)
HDL: 45.5 mg/dL (ref >39.00)
LDL CALC: 171 mg/dL — AB (ref 0–99)
NonHDL: 192.35
TRIGLYCERIDES: 109 mg/dL (ref 0.0–149.0)
VLDL: 21.8 mg/dL (ref 0.0–40.0)

## 2017-06-04 LAB — CBC WITH DIFFERENTIAL/PLATELET
BASOS ABS: 0 10*3/uL (ref 0.0–0.1)
Basophils Relative: 0.5 % (ref 0.0–3.0)
EOS ABS: 0.1 10*3/uL (ref 0.0–0.7)
Eosinophils Relative: 1.6 % (ref 0.0–5.0)
HCT: 41.8 % (ref 36.0–46.0)
Hemoglobin: 13.7 g/dL (ref 12.0–15.0)
LYMPHS ABS: 1.6 10*3/uL (ref 0.7–4.0)
Lymphocytes Relative: 31.3 % (ref 12.0–46.0)
MCHC: 32.9 g/dL (ref 30.0–36.0)
MCV: 91.3 fl (ref 78.0–100.0)
MONO ABS: 0.7 10*3/uL (ref 0.1–1.0)
Monocytes Relative: 14.2 % — ABNORMAL HIGH (ref 3.0–12.0)
NEUTROS ABS: 2.8 10*3/uL (ref 1.4–7.7)
NEUTROS PCT: 52.4 % (ref 43.0–77.0)
PLATELETS: 315 10*3/uL (ref 150.0–400.0)
RBC: 4.57 Mil/uL (ref 3.87–5.11)
RDW: 14.9 % (ref 11.5–15.5)
WBC: 5.3 10*3/uL (ref 4.0–10.5)

## 2017-06-04 LAB — BASIC METABOLIC PANEL
BUN: 14 mg/dL (ref 6–23)
CHLORIDE: 102 meq/L (ref 96–112)
CO2: 32 mEq/L (ref 19–32)
CREATININE: 0.83 mg/dL (ref 0.40–1.20)
Calcium: 9 mg/dL (ref 8.4–10.5)
GFR: 69.39 mL/min (ref >60.00)
GLUCOSE: 105 mg/dL — AB (ref 70–99)
Potassium: 4.3 mEq/L (ref 3.5–5.1)
Sodium: 139 mEq/L (ref 135–145)

## 2017-06-04 LAB — HEMOGLOBIN A1C: Hgb A1c MFr Bld: 5.9 % (ref 4.6–6.5)

## 2017-06-09 ENCOUNTER — Telehealth: Payer: Self-pay | Admitting: Internal Medicine

## 2017-06-09 NOTE — Telephone Encounter (Signed)
Pt called requesting a refill on her ALPRAZolam (XANAX) 0.5 MG tablet and she would like to know about her labs results. Pt also requested that we mail her a copy of her results. Please advise, thank you!  [facility]acy [id number] [place], [place]  Call pt @ [phone]

## 2017-06-09 NOTE — Telephone Encounter (Signed)
Called pt let her know script will be faxed and labs are being mailed out.

## 2017-06-11 MED ORDER — ALPRAZOLAM 0.5 MG PO TABS: ORAL_TABLET | ORAL | 0 refills | Status: DC

## 2017-06-11 NOTE — Telephone Encounter (Signed)
Ok to refill 

## 2017-06-11 NOTE — Telephone Encounter (Signed)
Pt called stating that her pharmacy does not have her medication. Please advise, thank you!

## 2017-06-11 NOTE — Telephone Encounter (Signed)
No I did not send information to you it does need script

## 2017-06-11 NOTE — Telephone Encounter (Signed)
Faxed

## 2017-06-11 NOTE — Telephone Encounter (Signed)
It was ok'd.  I don't know if there is a copy or rx here.  I am ok with sending to pharmacy - just don't want two different rx's sent.

## 2017-06-11 NOTE — Telephone Encounter (Signed)
rx ok'd for xanax.  Signed and placed in box.

## 2017-06-24 ENCOUNTER — Other Ambulatory Visit: Payer: Self-pay | Admitting: Internal Medicine

## 2017-07-02 DIAGNOSIS — H15102 Unspecified episcleritis, left eye: Secondary | ICD-10-CM | POA: Diagnosis not present

## 2017-07-19 ENCOUNTER — Other Ambulatory Visit: Payer: Self-pay | Admitting: Internal Medicine

## 2017-07-26 ENCOUNTER — Telehealth: Payer: Self-pay | Admitting: Internal Medicine

## 2017-07-26 MED ORDER — ALPRAZOLAM 0.5 MG PO TABS: ORAL_TABLET | ORAL | 1 refills | Status: DC

## 2017-07-26 NOTE — Telephone Encounter (Signed)
Pt called requesting a refill on her ALPRAZolam (XANAX) 0.5 MG tablet. Please advise, thank you!  [facility]acy [id number] [place], [place]

## 2017-07-26 NOTE — Telephone Encounter (Signed)
Last o/v 05/13/17 Next o/v 11/12/17 Last o/v 06/09/17

## 2017-07-26 NOTE — Telephone Encounter (Signed)
ok'd refill xanax #45 with one refill.

## 2017-07-27 NOTE — Telephone Encounter (Signed)
Script faxed.

## 2017-08-04 ENCOUNTER — Telehealth: Payer: Self-pay | Admitting: *Deleted

## 2017-08-04 NOTE — Telephone Encounter (Signed)
Pt requested to have a script for a walker. Pt also requested to have the script mailed to her.  Pt contact [phone]

## 2017-08-04 NOTE — Telephone Encounter (Signed)
Called patient her walker has broke. Needs script for walker with seat. She would like mailed to her. Informed patient we will call when done.

## 2017-08-05 ENCOUNTER — Other Ambulatory Visit: Payer: Self-pay | Admitting: Ophthalmology

## 2017-08-05 DIAGNOSIS — H5789 Other specified disorders of eye and adnexa: Secondary | ICD-10-CM

## 2017-08-05 DIAGNOSIS — H15102 Unspecified episcleritis, left eye: Secondary | ICD-10-CM | POA: Diagnosis not present

## 2017-08-05 NOTE — Telephone Encounter (Signed)
Called patient confirmed mailing address and informed patient will be sent today.

## 2017-08-05 NOTE — Telephone Encounter (Signed)
rx placed in box for rx for rolling walker with seat.

## 2017-08-16 ENCOUNTER — Ambulatory Visit
Admission: RE | Admit: 2017-08-16 | Discharge: 2017-08-16 | Disposition: A | Discharge status: Home / Self Care | Payer: Medicare Other | Source: Ambulatory Visit | Attending: Ophthalmology | Admitting: Ophthalmology

## 2017-08-16 DIAGNOSIS — H578 Other specified disorders of eye and adnexa: Secondary | ICD-10-CM | POA: Diagnosis not present

## 2017-08-16 DIAGNOSIS — H02843 Edema of right eye, unspecified eyelid: Secondary | ICD-10-CM | POA: Diagnosis not present

## 2017-08-16 DIAGNOSIS — H5789 Other specified disorders of eye and adnexa: Secondary | ICD-10-CM

## 2017-08-16 HISTORY — DX: Essential (primary) hypertension: I10

## 2017-08-16 HISTORY — DX: Systemic involvement of connective tissue, unspecified: M35.9

## 2017-08-16 LAB — POCT I-STAT CREATININE: CREATININE: 0.8 mg/dL (ref 0.44–1.00)

## 2017-08-16 MED ORDER — IOPAMIDOL (ISOVUE-300) INJECTION 61%
75.0000 mL | Freq: Once | INTRAVENOUS | Status: AC | PRN
  Administered 2017-08-16: 75 mL via INTRAVENOUS

## 2017-10-15 ENCOUNTER — Other Ambulatory Visit: Payer: Self-pay

## 2017-10-15 ENCOUNTER — Other Ambulatory Visit: Payer: Self-pay | Admitting: Internal Medicine

## 2017-10-15 MED ORDER — ALPRAZOLAM 0.5 MG PO TABS: ORAL_TABLET | ORAL | 1 refills | Status: DC

## 2017-10-15 NOTE — Telephone Encounter (Signed)
Refill faxed

## 2017-10-15 NOTE — Telephone Encounter (Signed)
Last OV 05/13/2017 Next OV 11/12/17 Last refill 07/26/2017 with 1 refill

## 2017-10-15 NOTE — Telephone Encounter (Signed)
Please see refill request.

## 2017-10-15 NOTE — Telephone Encounter (Signed)
Copied from [place] [id number]. Topic: Quick Communication - See Telephone Encounter >> Oct 15, 2017 10:07 AM Vernona Rieger wrote: CRM for notification. See Telephone encounter for:  Needs Refill on Xanex 0.5mg , [facility] [place] [facility]

## 2017-10-26 ENCOUNTER — Ambulatory Visit: Payer: Self-pay

## 2017-10-26 ENCOUNTER — Telehealth: Payer: Self-pay | Admitting: Internal Medicine

## 2017-10-26 NOTE — Telephone Encounter (Signed)
Copied from [place] [phone]. Topic: Quick Communication - See Telephone Encounter >> Oct 26, 2017  9:11 AM Arletha Grippe wrote: CRM for notification. See Telephone encounter for:   10/26/17. Pt would like rx for hip pain. She has arthritis and would like something for inflammation and pain. [facility] [place] . Cb number is [phone]

## 2017-10-26 NOTE — Telephone Encounter (Signed)
See Triage note 

## 2017-10-26 NOTE — Telephone Encounter (Signed)
Patient says she has been using heat and ibuprofen and it seems to help. If symptoms worsen she will go to clinic near her house or call our office back.

## 2017-10-26 NOTE — Telephone Encounter (Signed)
Please advise 

## 2017-10-26 NOTE — Telephone Encounter (Signed)
Pt refusing to make appointment at this time. Pt is requesting a pain med or something for inflammation. States doesn't want come that far to be seen and "I cant lie down on the xray table" Informed that she would need an appointment. States she wants to wait until her appointment 11/12/17 with Dr Nicki Reaper.  Reason for Disposition . [1] MODERATE pain (e.g., interferes with normal activities, limping) AND [2] present > 3 days  Answer Assessment - Initial Assessment Questions 1. LOCATION and RADIATION: "Where is the pain located?"      Right hip up close to ribs  2. QUALITY: "What does the pain feel like?"  (e.g., sharp, dull, aching, burning)     Sharp "feels like the bones are hitting together" Hurts worse when laying down than with sitting up.  3. SEVERITY: "How bad is the pain?" "What does it keep you from doing?"   (Scale 1-10; or mild, moderate, severe)   -  MILD (1-3): doesn't interfere with normal activities    -  MODERATE (4-7): interferes with normal activities (e.g., work or school) or awakens from sleep, limping    -  SEVERE (8-10): excruciating pain, unable to do any normal activities, unable to walk     Severe at night in bed. Feels better when she sits up.  Better in the daytime  4. ONSET: "When did the pain start?" "Does it come and go, or is it there all the time?"    10/20/17. Pain is there all the time. Worse in the am when she is getting out of bed. It makes her scream.  5. WORK OR EXERCISE: "Has there been any recent work or exercise that involved this part of the body?"      Vacuumed last week 6. CAUSE: "What do you think is causing the hip pain?"      Arthritis she thinks its "bone on bone" 7. AGGRAVATING FACTORS: "What makes the hip pain worse?" (e.g., walking, climbing stairs, running)     Laying in bed, when getting out of bed first thing in the am  8. OTHER SYMPTOMS: "Do you have any other symptoms?" (e.g., back pain, pain shooting down leg,  fever, rash)  C/o back  feeling "tired" uses walker for about 5-6 years, no pain shoots down her leg; no fever or rash  Protocols used: HIP PAIN-A-AH

## 2017-10-26 NOTE — Telephone Encounter (Signed)
Needs to be evaluated.  If does not want to come in here, then there should be acute care or urgent care closer to her home.  Needs to be evaluated so know how to treat and what is needed.

## 2017-10-31 DIAGNOSIS — J069 Acute upper respiratory infection, unspecified: Secondary | ICD-10-CM | POA: Diagnosis not present

## 2017-11-12 ENCOUNTER — Ambulatory Visit: Payer: Medicare Other | Admitting: Internal Medicine

## 2017-11-12 ENCOUNTER — Encounter: Payer: Self-pay | Admitting: Internal Medicine

## 2017-11-12 DIAGNOSIS — R739 Hyperglycemia, unspecified: Secondary | ICD-10-CM | POA: Diagnosis not present

## 2017-11-12 DIAGNOSIS — E7800 Pure hypercholesterolemia, unspecified: Secondary | ICD-10-CM | POA: Diagnosis not present

## 2017-11-12 DIAGNOSIS — I700 Atherosclerosis of aorta: Secondary | ICD-10-CM

## 2017-11-12 DIAGNOSIS — G8929 Other chronic pain: Secondary | ICD-10-CM | POA: Diagnosis not present

## 2017-11-12 DIAGNOSIS — I10 Essential (primary) hypertension: Secondary | ICD-10-CM | POA: Diagnosis not present

## 2017-11-12 DIAGNOSIS — F419 Anxiety disorder, unspecified: Secondary | ICD-10-CM

## 2017-11-12 DIAGNOSIS — M549 Dorsalgia, unspecified: Secondary | ICD-10-CM | POA: Diagnosis not present

## 2017-11-12 MED ORDER — MELOXICAM 7.5 MG PO TABS: 7.5000 mg | ORAL_TABLET | Freq: Every day | ORAL | 0 refills | Status: DC | PRN

## 2017-11-12 NOTE — Progress Notes (Signed)
[name], female   DOB: 1930/10/26, 83 y.o.   MRN: [id number]   Subjective:    [name], female    DOB: [birth year], 83 y.o.   MRN: [id number]  HPI  Patient here for a scheduled follow up.  She reports she is doing relatively well.  Does report previously having back/hip pain.  Is better now.  No pain.  Request to have meloxicam to take prn.  Would take rarely.  Has previously taken and works well.  Discussed possible side effects and risk of taking regularly.  No chest pain.  No sob.  No acid reflux.  No abdominal pain.  Bowels moving.  States blood pressures are averaging 120s/60s.     Past Medical History:  Diagnosis Date  . Anxiety   . Collagen vascular disease ([place])   . Degenerative disc disease   . Depression   . GERD (gastroesophageal reflux disease)   . Hyperlipidemia   . Hypertension    Past Surgical History:  Procedure Laterality Date  . ABDOMINAL HYSTERECTOMY  1997   partial  . BREAST SURGERY  1960 to 1970   lumps removed  . CYSTOCELE REPAIR    . ESOPHAGOGASTRODUODENOSCOPY (EGD) WITH PROPOFOL N/A 10/03/2016   Procedure: ESOPHAGOGASTRODUODENOSCOPY (EGD) WITH PROPOFOL;  Surgeon: Manya Silvas, MD;  Location: [facility] ENDOSCOPY;  Service: Endoscopy;  Laterality: N/A;  . RECTOCELE REPAIR     Family History  Problem Relation Age of Onset  . Heart disease Mother   . Arthritis Mother   . Ulcers Father        uremic poisoning  . Arthritis Sister        x 4  . Hyperlipidemia Sister        x 1  . Hypertension Sister   . Cancer Sister        x 2 (ovarian cancer) & 1 (colon cancer)  . Diabetes Sister    Social History   Socioeconomic History  . Marital status: Married    Spouse name: None  . Number of children: None  . Years of education: None  . Highest education level: None  Social Needs  . Financial resource strain: None  . Food insecurity - worry: None  . Food insecurity - inability: None  . Transportation needs - medical: None   . Transportation needs - non-medical: None  Occupational History  . None  Tobacco Use  . Smoking status: Never Smoker  . Smokeless tobacco: Never Used  Substance and Sexual Activity  . Alcohol use: No    Alcohol/week: 0.0 oz  . Drug use: No  . Sexual activity: None  Other Topics Concern  . None  Social History Narrative  . None    Outpatient Encounter Medications as of 11/12/2017  Medication Sig  . Alpha-Lipoic Acid 300 MG TABS Take 1 tablet (300 mg total) by mouth daily.  Marland Kitchen ALPRAZolam (XANAX) 0.5 MG tablet TAKE 1 TABLET BY MOUTH AT BEDTIME & 1/2 TABLET DURING THE DAY  . aspirin 81 MG tablet Take 81 mg by mouth daily.  . metoprolol succinate (TOPROL-XL) 25 MG 24 hr tablet TAKE ONE-HALF TABLET BY  MOUTH TWICE A DAY  . Multiple Vitamin (MULTIVITAMIN) tablet Take 1 tablet by mouth daily.  . ranitidine (ZANTAC) 150 MG tablet TAKE 1 TABLET BY MOUTH  DAILY 30 MINUTES BEFORE  BREAKFAST  . vitamin C (ASCORBIC ACID) 250 MG tablet Take 250 mg by mouth daily.  . vitamin E 400 UNIT  capsule Take 400 Units by mouth daily.  . meloxicam (MOBIC) 7.5 MG tablet Take 1 tablet (7.5 mg total) by mouth daily as needed for pain.   No facility-administered encounter medications on file as of 11/12/2017.     Review of Systems  Constitutional: Negative for appetite change and unexpected weight change.  HENT: Negative for congestion and sinus pressure.   Respiratory: Negative for cough, chest tightness and shortness of breath.   Cardiovascular: Negative for chest pain, palpitations and leg swelling.  Gastrointestinal: Negative for abdominal pain, diarrhea, nausea and vomiting.  Genitourinary: Negative for difficulty urinating and dysuria.  Musculoskeletal: Negative for joint swelling and myalgias.       Hip and back pain better.    Skin: Negative for color change and rash.  Neurological: Negative for dizziness, light-headedness and headaches.  Psychiatric/Behavioral: Negative for agitation and  dysphoric mood.       Objective:     Blood pressure rechecked by me:  134/70  Physical Exam  Constitutional: She appears well-developed and well-nourished. No distress.  HENT:  Nose: Nose normal.  Mouth/Throat: Oropharynx is clear and moist.  Neck: Neck supple. No thyromegaly present.  Cardiovascular: Normal rate and regular rhythm.  Pulmonary/Chest: Breath sounds normal. No respiratory distress. She has no wheezes.  Abdominal: Soft. Bowel sounds are normal. There is no tenderness.  Musculoskeletal: She exhibits no edema or tenderness.  Lymphadenopathy:    She has no cervical adenopathy.  Skin: No rash noted. No erythema.  Psychiatric: She has a normal mood and affect. Her behavior is normal.    BP 134/70   Pulse 63   Temp 98 F (36.7 C) (Oral)   Wt 185 lb (83.9 kg)   BMI 34.93 kg/m  Wt Readings from Last 3 Encounters:  11/12/17 185 lb (83.9 kg)  05/13/17 186 lb 3.2 oz (84.5 kg)  10/04/16 180 lb 12.8 oz (82 kg)     Lab Results  Component Value Date   WBC 5.3 06/04/2017   HGB 13.7 06/04/2017   HCT 41.8 06/04/2017   PLT 315.0 06/04/2017   GLUCOSE 105 (H) 06/04/2017   CHOL 238 (H) 06/04/2017   TRIG 109.0 06/04/2017   HDL 45.50 06/04/2017   LDLCALC 171 (H) 06/04/2017   ALT 10 06/04/2017   AST 15 06/04/2017   NA 139 06/04/2017   K 4.3 06/04/2017   CL 102 06/04/2017   CREATININE 0.80 08/16/2017   BUN 14 06/04/2017   CO2 32 06/04/2017   TSH 1.193 10/02/2016   HGBA1C 5.9 06/04/2017    Ct Orbits W Contrast  Result Date: 08/16/2017 CLINICAL DATA:  Swelling or mass of LEFT eye. Concern for neoplastic or infiltrative process. EXAM: CT ORBITS WITH CONTRAST TECHNIQUE: Multidetector CT images was performed according to the standard protocol following intravenous contrast administration. CONTRAST:  27m ISOVUE-300 IOPAMIDOL (ISOVUE-300) INJECTION 61% COMPARISON:  None. FINDINGS: Orbits: No traumatic or inflammatory finding. Globes, optic nerves, orbital fat, extraocular  muscles, vascular structures, and lacrimal glands are normal. LEFT cataract surgery. Visualized sinuses: Clear. Soft tissues: Negative. Limited intracranial: No significant or unexpected finding. IMPRESSION: Negative exam.  No cause seen for the reported symptoms. Electronically Signed   By: JStaci RighterM.D.   On: 08/16/2017 16:21       Assessment & Plan:   Problem List Items Addressed This Visit    Anxiety    Doing better.  Follow.        Aortic atherosclerosis ([facility])    Declines cholesterol medication.  Chronic back pain    Better.  Follow.  Request prn meloxicam.  States will not take regularly.        Relevant Medications   meloxicam (MOBIC) 7.5 MG tablet   Essential hypertension, benign    Blood pressure on recheck improved.  Same medication regimen.  Follow.        Relevant Orders   TSH   Basic metabolic panel   Hypercholesterolemia    Low cholesterol diet and exercise.  Follow lipid panel.        Relevant Orders   Lipid panel   Hepatic function panel   Hyperglycemia    Low carb diet and exercise.  Follow met b and a1c.        Relevant Orders   Hemoglobin A1c       Einar Pheasant, MD

## 2017-11-14 ENCOUNTER — Encounter: Payer: Self-pay | Admitting: Internal Medicine

## 2017-11-14 NOTE — Assessment & Plan Note (Signed)
Blood pressure on recheck improved.  Same medication regimen.  Follow.   

## 2017-11-14 NOTE — Assessment & Plan Note (Signed)
Low cholesterol diet and exercise.  Follow lipid panel.   

## 2017-11-14 NOTE — Assessment & Plan Note (Signed)
Low carb diet and exercise.  Follow met b and a1c.   

## 2017-11-14 NOTE — Assessment & Plan Note (Signed)
Better.  Follow.  Request prn meloxicam.  States will not take regularly.

## 2017-11-14 NOTE — Assessment & Plan Note (Signed)
Doing better.  Follow.   

## 2017-11-14 NOTE — Assessment & Plan Note (Signed)
Declines cholesterol medication.   

## 2017-12-13 ENCOUNTER — Other Ambulatory Visit: Payer: Self-pay | Admitting: Internal Medicine

## 2017-12-19 ENCOUNTER — Other Ambulatory Visit: Payer: Self-pay | Admitting: Internal Medicine

## 2018-01-03 ENCOUNTER — Telehealth: Payer: Self-pay | Admitting: Internal Medicine

## 2018-01-03 MED ORDER — ALPRAZOLAM 0.5 MG PO TABS: ORAL_TABLET | ORAL | 1 refills | Status: DC

## 2018-01-03 NOTE — Telephone Encounter (Signed)
faxed

## 2018-01-03 NOTE — Telephone Encounter (Signed)
Copied from [place]. Topic: Quick Communication - See Telephone Encounter >> Jan 03, 2018 10:25 AM Conception Chancy, NT wrote: CRM for notification. See Telephone encounter for:  01/03/18.  Patient is requesting a [facility]

## 2018-01-03 NOTE — Telephone Encounter (Signed)
ok'd rx for alprazolam #45 with one refill.

## 2018-01-03 NOTE — Telephone Encounter (Signed)
Last OV 11/12/2017 Next OV not scheduled Last refill 10/15/2017 #45 with 1 refill

## 2018-01-25 ENCOUNTER — Other Ambulatory Visit: Payer: Self-pay | Admitting: Internal Medicine

## 2018-03-19 ENCOUNTER — Other Ambulatory Visit: Payer: Self-pay | Admitting: Internal Medicine

## 2018-03-22 NOTE — Telephone Encounter (Signed)
Refilled: 01/03/2018 Last OV: 11/12/2017 Next OV: not scheduled

## 2018-04-07 ENCOUNTER — Other Ambulatory Visit: Payer: Self-pay | Admitting: Internal Medicine

## 2018-05-02 ENCOUNTER — Telehealth: Payer: Self-pay | Admitting: Internal Medicine

## 2018-05-02 NOTE — Telephone Encounter (Signed)
Copied from [place]. Topic: Quick Communication - Rx Refill/Question >> May 02, 2018 11:18 AM Marin Olp L wrote: Medication: ALPRAZolam Duanne Moron) 0.5 MG tablet   Has the patient contacted their pharmacy? Yes.   (Agent: If no, request that the patient contact the pharmacy for the refill.) (Agent: If yes, when and what did the pharmacy advise?)  Preferred Pharmacy (with phone number or street name): [id number] - [place], [place] [address] [place] [address] Phone: [phone] Fax: [phone]  Agent: Please be advised that RX refills may take up to 3 business days. We ask that you follow-up with your pharmacy.

## 2018-05-03 NOTE — Telephone Encounter (Signed)
Alprazolam refill Last OV:11/12/17 Last refill:03/22/18 45 tab/0 refill [id number] Pharmacy: [id number] - [place], [place] [phone] (Phone) [phone] (Fax)

## 2018-05-03 NOTE — Telephone Encounter (Signed)
Please advise 

## 2018-05-04 MED ORDER — ALPRAZOLAM 0.5 MG PO TABS: ORAL_TABLET | ORAL | 0 refills | Status: DC

## 2018-05-04 NOTE — Telephone Encounter (Signed)
OK to fill

## 2018-05-05 DIAGNOSIS — G8929 Other chronic pain: Secondary | ICD-10-CM | POA: Diagnosis not present

## 2018-05-05 NOTE — Telephone Encounter (Signed)
Patient states she called the pharmacy and they do not have the medication. Advised patient it was sent on 6/5.

## 2018-05-05 NOTE — Telephone Encounter (Signed)
Patient aware.

## 2018-05-05 NOTE — Telephone Encounter (Signed)
I have refaxed prescription

## 2018-05-05 NOTE — Telephone Encounter (Signed)
Please confirm that Rx has been faxed

## 2018-06-09 ENCOUNTER — Other Ambulatory Visit: Payer: Medicare Other

## 2018-06-20 ENCOUNTER — Other Ambulatory Visit: Payer: Self-pay | Admitting: Internal Medicine

## 2018-06-20 NOTE — Telephone Encounter (Signed)
Last OV 12 /18 last refill 6/19 ?

## 2018-07-01 ENCOUNTER — Other Ambulatory Visit: Payer: Medicare Other

## 2018-07-01 ENCOUNTER — Other Ambulatory Visit (INDEPENDENT_AMBULATORY_CARE_PROVIDER_SITE_OTHER): Payer: Medicare Other

## 2018-07-01 DIAGNOSIS — E7800 Pure hypercholesterolemia, unspecified: Secondary | ICD-10-CM | POA: Diagnosis not present

## 2018-07-01 DIAGNOSIS — I10 Essential (primary) hypertension: Secondary | ICD-10-CM | POA: Diagnosis not present

## 2018-07-01 DIAGNOSIS — R739 Hyperglycemia, unspecified: Secondary | ICD-10-CM

## 2018-07-01 LAB — LIPID PANEL
CHOLESTEROL: 216 mg/dL — AB (ref 0–200)
HDL: 43.9 mg/dL (ref >39.00)
LDL Cholesterol: 149 mg/dL — ABNORMAL HIGH (ref 0–99)
NonHDL: 172.15
Total CHOL/HDL Ratio: 5
Triglycerides: 115 mg/dL (ref 0.0–149.0)
VLDL: 23 mg/dL (ref 0.0–40.0)

## 2018-07-01 LAB — BASIC METABOLIC PANEL
BUN: 13 mg/dL (ref 6–23)
CHLORIDE: 100 meq/L (ref 96–112)
CO2: 30 meq/L (ref 19–32)
CREATININE: 0.79 mg/dL (ref 0.40–1.20)
Calcium: 9.3 mg/dL (ref 8.4–10.5)
GFR: 73.27 mL/min (ref >60.00)
GLUCOSE: 96 mg/dL (ref 70–99)
Potassium: 4.5 mEq/L (ref 3.5–5.1)
Sodium: 137 mEq/L (ref 135–145)

## 2018-07-01 LAB — HEPATIC FUNCTION PANEL
ALBUMIN: 4 g/dL (ref 3.5–5.2)
ALT: 9 U/L (ref 0–35)
AST: 15 U/L (ref 0–37)
Alkaline Phosphatase: 60 U/L (ref 39–117)
BILIRUBIN TOTAL: 0.6 mg/dL (ref 0.2–1.2)
Bilirubin, Direct: 0.1 mg/dL (ref 0.0–0.3)
TOTAL PROTEIN: 7 g/dL (ref 6.0–8.3)

## 2018-07-01 LAB — HEMOGLOBIN A1C: HEMOGLOBIN A1C: 6.2 % (ref 4.6–6.5)

## 2018-07-01 LAB — TSH: TSH: 1.58 u[IU]/mL (ref 0.35–4.50)

## 2018-07-06 ENCOUNTER — Telehealth: Payer: Self-pay | Admitting: Internal Medicine

## 2018-07-06 NOTE — Telephone Encounter (Signed)
Results mailed 

## 2018-07-06 NOTE — Telephone Encounter (Signed)
Copied from [place] [phone]. Topic: Quick Communication - See Telephone Encounter >> Jul 06, 2018 12:53 PM Bea Graff, NT wrote: CRM for notification. See Telephone encounter for: 07/06/18. Pt would like a copy of her lab results mailed to her if possible.

## 2018-07-25 ENCOUNTER — Other Ambulatory Visit: Payer: Self-pay | Admitting: Internal Medicine

## 2018-08-05 ENCOUNTER — Ambulatory Visit: Payer: Medicare Other | Admitting: Internal Medicine

## 2018-08-08 ENCOUNTER — Telehealth: Payer: Self-pay

## 2018-08-08 NOTE — Telephone Encounter (Signed)
Copied from [place] [phone]. Topic: General - Other >> Aug 08, 2018 10:42 AM Marin Olp L wrote: Reason for CRM: Patient would like a call back from Dr. Nicki Reaper cma  to discuss her medication prescribed at a walk in clinic back in December 2018.

## 2018-08-09 NOTE — Telephone Encounter (Signed)
Patient was just wondering why she was seen at walk in on 12/2 and what medication was given, she is going to discard medication

## 2018-08-16 ENCOUNTER — Telehealth: Payer: Self-pay | Admitting: Internal Medicine

## 2018-08-16 MED ORDER — ALPRAZOLAM 0.5 MG PO TABS: ORAL_TABLET | ORAL | 1 refills | Status: DC

## 2018-08-16 NOTE — Addendum Note (Signed)
Addended by: Alisa Graff on: 08/16/2018 04:01 PM   Modules accepted: Orders

## 2018-08-16 NOTE — Addendum Note (Signed)
Addended by: Lars Masson on: 08/16/2018 10:55 AM   Modules accepted: Orders

## 2018-08-16 NOTE — Telephone Encounter (Signed)
rx ok'd for alprazolam #45 with one refill.   

## 2018-08-16 NOTE — Telephone Encounter (Signed)
Xanax 0.5 refill Last Refill:06/20/18 # 45 Last OV: 11/12/17 PCP: Bettina Gavia Pharmacy:  [phone]

## 2018-08-16 NOTE — Telephone Encounter (Signed)
Copied from [place] [phone]. Topic: Quick Communication - See Telephone Encounter >> Aug 16, 2018  9:48 AM Conception Chancy, NT wrote: CRM for notification. See Telephone encounter for: 08/16/18  Patient is requesting a refill on ALPRAZolam (XANAX) 0.5 MG tablet.  [id number] [place] [place] - [address] [place] [address] Phone: [phone] Fax: [phone]

## 2018-08-16 NOTE — Telephone Encounter (Signed)
Rx refill

## 2018-08-17 NOTE — Telephone Encounter (Signed)
Pt calling stating that the pharmacy never received the RX for Xanax

## 2018-08-17 NOTE — Telephone Encounter (Signed)
rx concern.

## 2018-08-18 NOTE — Telephone Encounter (Signed)
rx is ready. Patient is aware

## 2018-08-23 ENCOUNTER — Other Ambulatory Visit: Payer: Self-pay | Admitting: Internal Medicine

## 2018-09-15 ENCOUNTER — Ambulatory Visit (INDEPENDENT_AMBULATORY_CARE_PROVIDER_SITE_OTHER): Payer: Medicare Other | Admitting: Internal Medicine

## 2018-09-15 ENCOUNTER — Encounter: Payer: Self-pay | Admitting: Internal Medicine

## 2018-09-15 VITALS — BP 130/72 | HR 60 | Temp 98.2°F | Resp 18 | Ht 61.0 in | Wt 185.8 lb

## 2018-09-15 DIAGNOSIS — Z0000 Encounter for general adult medical examination without abnormal findings: Secondary | ICD-10-CM

## 2018-09-15 DIAGNOSIS — I10 Essential (primary) hypertension: Secondary | ICD-10-CM

## 2018-09-15 DIAGNOSIS — I700 Atherosclerosis of aorta: Secondary | ICD-10-CM | POA: Diagnosis not present

## 2018-09-15 DIAGNOSIS — G6289 Other specified polyneuropathies: Secondary | ICD-10-CM

## 2018-09-15 DIAGNOSIS — E7800 Pure hypercholesterolemia, unspecified: Secondary | ICD-10-CM | POA: Diagnosis not present

## 2018-09-15 DIAGNOSIS — F419 Anxiety disorder, unspecified: Secondary | ICD-10-CM | POA: Diagnosis not present

## 2018-09-15 DIAGNOSIS — R739 Hyperglycemia, unspecified: Secondary | ICD-10-CM

## 2018-09-15 NOTE — Progress Notes (Signed)
[name], female   DOB: [birth year], 84 y.o.   MRN: [id number]   Subjective:    [name], female    DOB: [birth year], 84 y.o.   MRN: [id number]  HPI  Patient here for her physical exam.  She reports she is feeling better.  Exercising more.  Leg pain - better.  No chest pain.  No sob.  No acid reflux.  No abdominal pain.  Bowels moving.  Handling stress.  Blood pressures doing better.  Outside checks 120-130s/60-70s.  Anxiety better.     Past Medical History:  Diagnosis Date  . Anxiety   . Collagen vascular disease ([place])   . Degenerative disc disease   . Depression   . GERD (gastroesophageal reflux disease)   . Hyperlipidemia   . Hypertension    Past Surgical History:  Procedure Laterality Date  . ABDOMINAL HYSTERECTOMY  1997   partial  . BREAST SURGERY  1960 to 1970   lumps removed  . CYSTOCELE REPAIR    . ESOPHAGOGASTRODUODENOSCOPY (EGD) WITH PROPOFOL N/A 10/03/2016   Procedure: ESOPHAGOGASTRODUODENOSCOPY (EGD) WITH PROPOFOL;  Surgeon: Manya Silvas, MD;  Location: [facility] ENDOSCOPY;  Service: Endoscopy;  Laterality: N/A;  . RECTOCELE REPAIR     Family History  Problem Relation Age of Onset  . Heart disease Mother   . Arthritis Mother   . Ulcers Father        uremic poisoning  . Arthritis Sister        x 4  . Hyperlipidemia Sister        x 1  . Hypertension Sister   . Cancer Sister        x 2 (ovarian cancer) & 1 (colon cancer)  . Diabetes Sister    Social History   Socioeconomic History  . Marital status: Married    Spouse name: Not on file  . Number of children: Not on file  . Years of education: Not on file  . Highest education level: Not on file  Occupational History  . Not on file  Social Needs  . Financial resource strain: Not on file  . Food insecurity:    Worry: Not on file    Inability: Not on file  . Transportation needs:    Medical: Not on file    Non-medical: Not on file  Tobacco Use  . Smoking status: Never  Smoker  . Smokeless tobacco: Never Used  Substance and Sexual Activity  . Alcohol use: No    Alcohol/week: 0.0 standard drinks  . Drug use: No  . Sexual activity: Not on file  Lifestyle  . Physical activity:    Days per week: Not on file    Minutes per session: Not on file  . Stress: Not on file  Relationships  . Social connections:    Talks on phone: Not on file    Gets together: Not on file    Attends religious service: Not on file    Active member of club or organization: Not on file    Attends meetings of clubs or organizations: Not on file    Relationship status: Not on file  Other Topics Concern  . Not on file  Social History Narrative  . Not on file    Outpatient Encounter Medications as of 09/15/2018  Medication Sig  . Alpha-Lipoic Acid 300 MG TABS Take 1 tablet (300 mg total) by mouth daily. (Patient taking differently: Take 0.5 tablets by mouth daily. )  .  ALPRAZolam (XANAX) 0.5 MG tablet TAKE ONE TABLET BY MOUTH AT BEDTIME AND 1/2 TABLET DURING THE DAY  . aspirin 81 MG tablet Take 81 mg by mouth daily.  . meloxicam (MOBIC) 7.5 MG tablet TAKE 1 TABLET (7.5 MG TOTAL) BY MOUTH DAILY AS NEEDED FOR PAIN.  . metoprolol succinate (TOPROL-XL) 25 MG 24 hr tablet TAKE ONE-HALF TABLET BY  MOUTH TWICE A DAY  . Multiple Vitamin (MULTIVITAMIN) tablet Take 1 tablet by mouth daily.  . ranitidine (ZANTAC) 150 MG tablet TAKE 1 TABLET BY MOUTH  DAILY 30 MINUTES BEFORE  BREAKFAST  . vitamin C (ASCORBIC ACID) 250 MG tablet Take 250 mg by mouth daily.  . vitamin E 400 UNIT capsule Take 400 Units by mouth daily.   No facility-administered encounter medications on file as of 09/15/2018.     Review of Systems  Constitutional: Negative for appetite change and unexpected weight change.  HENT: Negative for congestion and sinus pressure.   Eyes: Negative for pain and visual disturbance.  Respiratory: Negative for cough, chest tightness and shortness of breath.   Cardiovascular: Negative  for chest pain, palpitations and leg swelling.  Gastrointestinal: Negative for abdominal pain, diarrhea, nausea and vomiting.  Genitourinary: Negative for difficulty urinating and dysuria.  Musculoskeletal: Negative for joint swelling and myalgias.  Skin: Negative for color change and rash.  Neurological: Negative for dizziness, light-headedness and headaches.  Hematological: Negative for adenopathy. Does not bruise/bleed easily.  Psychiatric/Behavioral: Negative for agitation and dysphoric mood.       Objective:    Physical Exam  Constitutional: She is oriented to person, place, and time. She appears well-developed and well-nourished. No distress.  HENT:  Nose: Nose normal.  Mouth/Throat: Oropharynx is clear and moist.  Eyes: Right eye exhibits no discharge. Left eye exhibits no discharge. No scleral icterus.  Neck: Neck supple. No thyromegaly present.  Cardiovascular: Normal rate and regular rhythm.  Pulmonary/Chest: Breath sounds normal. No accessory muscle usage. No tachypnea. No respiratory distress. She has no decreased breath sounds. She has no wheezes. She has no rhonchi. Right breast exhibits no inverted nipple, no mass, no nipple discharge and no tenderness (no axillary adenopathy). Left breast exhibits no inverted nipple, no mass, no nipple discharge and no tenderness (no axilarry adenopathy).  Abdominal: Soft. Bowel sounds are normal. There is no tenderness.  Musculoskeletal: She exhibits no edema or tenderness.  Lymphadenopathy:    She has no cervical adenopathy.  Neurological: She is alert and oriented to person, place, and time.  Skin: No rash noted. No erythema.  Psychiatric: She has a normal mood and affect. Her behavior is normal.    BP 130/72 (BP Location: Left Arm, Patient Position: Sitting, Cuff Size: Normal)   Pulse 60   Temp 98.2 F (36.8 C) (Oral)   Resp 18   Ht '5\' 1"'$  (1.549 m)   Wt 185 lb 12.8 oz (84.3 kg)   SpO2 96%   BMI 35.11 kg/m  Wt Readings from  Last 3 Encounters:  09/15/18 185 lb 12.8 oz (84.3 kg)  11/12/17 185 lb (83.9 kg)  05/13/17 186 lb 3.2 oz (84.5 kg)     Lab Results  Component Value Date   WBC 5.3 06/04/2017   HGB 13.7 06/04/2017   HCT 41.8 06/04/2017   PLT 315.0 06/04/2017   GLUCOSE 96 07/01/2018   CHOL 216 (H) 07/01/2018   TRIG 115.0 07/01/2018   HDL 43.90 07/01/2018   LDLCALC 149 (H) 07/01/2018   ALT 9 07/01/2018   AST  15 07/01/2018   NA 137 07/01/2018   K 4.5 07/01/2018   CL 100 07/01/2018   CREATININE 0.79 07/01/2018   BUN 13 07/01/2018   CO2 30 07/01/2018   TSH 1.58 07/01/2018   HGBA1C 6.2 07/01/2018    Ct Orbits W Contrast  Result Date: 08/16/2017 CLINICAL DATA:  Swelling or mass of LEFT eye. Concern for neoplastic or infiltrative process. EXAM: CT ORBITS WITH CONTRAST TECHNIQUE: Multidetector CT images was performed according to the standard protocol following intravenous contrast administration. CONTRAST:  71m ISOVUE-300 IOPAMIDOL (ISOVUE-300) INJECTION 61% COMPARISON:  None. FINDINGS: Orbits: No traumatic or inflammatory finding. Globes, optic nerves, orbital fat, extraocular muscles, vascular structures, and lacrimal glands are normal. LEFT cataract surgery. Visualized sinuses: Clear. Soft tissues: Negative. Limited intracranial: No significant or unexpected finding. IMPRESSION: Negative exam.  No cause seen for the reported symptoms. Electronically Signed   By: JStaci RighterM.D.   On: 08/16/2017 16:21       Assessment & Plan:   Problem List Items Addressed This Visit    Anxiety    Doing better.  Follow.  Uses xanax.        Aortic atherosclerosis (HCC)    Declines cholesterol medication.        Essential hypertension, benign    Blood pressure under good control.  Continue same medication regimen.  Follow pressures.  Follow metabolic panel.        Relevant Orders   CBC with Differential/Platelet   TSH   Basic metabolic panel   Health care maintenance    Physical today 09/15/18.   Declines mammogram.  Declines colon evaluation.       Hypercholesterolemia    Low cholesterol diet and exercise.  Follow lipid panel.  Declines cholesterol medication.        Relevant Orders   Hepatic function panel   Lipid panel   Hyperglycemia    Low carb diet and exercise.  Follow met b and a1c.        Relevant Orders   Hemoglobin A1c   Peripheral neuropathy    Stable.         Other Visit Diagnoses    Routine general medical examination at a health care facility    -  Primary       CEinar Pheasant MD

## 2018-09-15 NOTE — Assessment & Plan Note (Signed)
Physical today 09/15/18.  Declines mammogram.  Declines colon evaluation.

## 2018-09-18 ENCOUNTER — Encounter: Payer: Self-pay | Admitting: Internal Medicine

## 2018-09-18 NOTE — Assessment & Plan Note (Signed)
Blood pressure under good control.  Continue same medication regimen.  Follow pressures.  Follow metabolic panel.   

## 2018-09-18 NOTE — Assessment & Plan Note (Signed)
Low carb diet and exercise.  Follow met b and a1c.   

## 2018-09-18 NOTE — Assessment & Plan Note (Signed)
Stable

## 2018-09-18 NOTE — Assessment & Plan Note (Signed)
Doing better.  Follow.  Uses xanax.

## 2018-09-18 NOTE — Assessment & Plan Note (Signed)
Declines cholesterol medication.   

## 2018-09-18 NOTE — Assessment & Plan Note (Signed)
Low cholesterol diet and exercise.  Follow lipid panel.  Declines cholesterol medication.   

## 2018-09-30 DIAGNOSIS — H2511 Age-related nuclear cataract, right eye: Secondary | ICD-10-CM | POA: Diagnosis not present

## 2018-09-30 DIAGNOSIS — H35372 Puckering of macula, left eye: Secondary | ICD-10-CM | POA: Diagnosis not present

## 2018-10-04 ENCOUNTER — Other Ambulatory Visit: Payer: Self-pay | Admitting: Internal Medicine

## 2018-10-04 NOTE — Telephone Encounter (Signed)
Last OV 09/15/18 Next OV 01/20/19 Last refill 08/16/18

## 2018-10-04 NOTE — Telephone Encounter (Signed)
Copied from [place] [phone]. Topic: Quick Communication - Rx Refill/Question >> Oct 04, 2018 12:02 PM Judyann Munson wrote: Medication: ALPRAZolam Duanne Moron) 0.5 MG tablet  Has the patient contacted their pharmacy? Yes  Preferred Pharmacy (with phone number or street name): [place], [place]. [phone] (Phone) [phone] (Fax)    Agent: Please be advised that RX refills may take up to 3 business days. We ask that you follow-up with your pharmacy.

## 2018-10-05 MED ORDER — ALPRAZOLAM 0.5 MG PO TABS: ORAL_TABLET | ORAL | 1 refills | Status: DC

## 2018-10-06 ENCOUNTER — Telehealth: Payer: Self-pay

## 2018-10-06 NOTE — Telephone Encounter (Signed)
Pt aware.

## 2018-10-06 NOTE — Telephone Encounter (Signed)
Copied from [place] [phone]. Topic: General - Other >> Oct 06, 2018 10:46 AM Janace Aris A wrote: Reason for CRM: Pt says we sent her prescription to the wrong pharmacy, we sent it through mail order. Pt says she needs the prescription ALPRAZolam (XANAX) 0.5 MG tablet sent to the [facility].    Please advise   *I have called [facility] & has been noted that prescription should not be filled by them. Patient needs prescription sent to [facility] & does use [facility] for this medication. Can we please resend?*

## 2018-10-06 NOTE — Telephone Encounter (Signed)
Faxed to [facility]

## 2018-11-01 ENCOUNTER — Encounter: Payer: Self-pay | Admitting: Internal Medicine

## 2018-11-01 ENCOUNTER — Telehealth: Payer: Self-pay | Admitting: Internal Medicine

## 2018-11-01 NOTE — Telephone Encounter (Signed)
OK for her to switch to omeprazole?

## 2018-11-01 NOTE — Telephone Encounter (Signed)
If zantac was controlling symptoms, then would recommend change to pepcid 20mg  q day. (same class of medication and should still work the same).

## 2018-11-01 NOTE — Telephone Encounter (Signed)
Copied from [place] [phone]. Topic: General - Other >> Nov 01, 2018  1:58 PM Lennox Solders wrote: Reason for CRM: pt is calling and there is  a recall on ranitidine 150 mg. Pt would like to try omeprazole. Please [facility]

## 2018-11-02 ENCOUNTER — Other Ambulatory Visit: Payer: Self-pay

## 2018-11-02 MED ORDER — FAMOTIDINE 20 MG PO TABS: 20.0000 mg | ORAL_TABLET | Freq: Every day | ORAL | 1 refills | Status: DC

## 2018-11-02 NOTE — Telephone Encounter (Signed)
Famotidine 20 mg sent in per pt request

## 2018-12-06 ENCOUNTER — Other Ambulatory Visit: Payer: Self-pay | Admitting: Internal Medicine

## 2019-01-04 ENCOUNTER — Other Ambulatory Visit: Payer: Self-pay | Admitting: Internal Medicine

## 2019-01-04 NOTE — Telephone Encounter (Signed)
Copied from [place] [phone]. Topic: Quick Communication - Rx Refill/Question >> Jan 04, 2019 11:54 AM Leward Quan A wrote: Medication: ALPRAZolam Duanne Moron) 0.5 MG tablet   Has the patient contacted their pharmacy? Yes.   (Agent: If no, request that the patient contact the pharmacy for the refill.) (Agent: If yes, when and what did the pharmacy advise?)  Preferred Pharmacy (with phone number or street name): [place], [place]. [phone] (Phone) [phone] (Fax)    Agent: Please be advised that RX refills may take up to 3 business days. We ask that you follow-up with your pharmacy.

## 2019-01-05 NOTE — Telephone Encounter (Signed)
Last OV 09/15/2018 Next OV no appt  Last refill 10/05/18

## 2019-01-06 MED ORDER — ALPRAZOLAM 0.5 MG PO TABS: ORAL_TABLET | ORAL | 0 refills | Status: DC

## 2019-01-06 NOTE — Telephone Encounter (Signed)
I ok'd refill for her xanax, but she does not have a f/u appt scheduled. Need to schedule f/u appt within the next month.

## 2019-01-06 NOTE — Telephone Encounter (Signed)
Put note on schedule for her to schedule f/u when she comes in for labs.

## 2019-01-13 ENCOUNTER — Other Ambulatory Visit: Payer: Medicare Other

## 2019-01-20 ENCOUNTER — Ambulatory Visit: Payer: Medicare Other | Admitting: Internal Medicine

## 2019-02-20 ENCOUNTER — Other Ambulatory Visit: Payer: Self-pay | Admitting: Internal Medicine

## 2019-02-20 NOTE — Telephone Encounter (Signed)
I refilled xanax x 1.  Needs to schedule a f/u appt.

## 2019-02-22 NOTE — Telephone Encounter (Signed)
Called and informed pt about medication sent to pharmacy and scheduled an f/up appt with the provider.  Gae [email or web address].cma

## 2019-03-06 ENCOUNTER — Other Ambulatory Visit: Payer: Self-pay | Admitting: Internal Medicine

## 2019-03-21 ENCOUNTER — Telehealth: Payer: Self-pay | Admitting: Internal Medicine

## 2019-03-21 NOTE — Telephone Encounter (Signed)
Copied from [place] [phone]. Topic: General - Other >> Mar 21, 2019  3:01 PM Keene Breath wrote: Reason for CRM: Patient called to request a referral to a GYN doctor.  Patient stated that she would like a female doctor.  Please call patient ad [phone]

## 2019-03-23 NOTE — Telephone Encounter (Signed)
Patient stated she called her insurance company and does not need a referral. She is scheduled for an appt with GYN

## 2019-03-31 ENCOUNTER — Ambulatory Visit (INDEPENDENT_AMBULATORY_CARE_PROVIDER_SITE_OTHER): Payer: Medicare Other | Admitting: Obstetrics and Gynecology

## 2019-03-31 ENCOUNTER — Encounter: Payer: Self-pay | Admitting: Obstetrics and Gynecology

## 2019-03-31 ENCOUNTER — Other Ambulatory Visit: Payer: Self-pay

## 2019-03-31 VITALS — BP 128/70 | HR 62 | Ht 61.0 in | Wt 184.0 lb

## 2019-03-31 DIAGNOSIS — N813 Complete uterovaginal prolapse: Secondary | ICD-10-CM | POA: Diagnosis not present

## 2019-03-31 NOTE — Progress Notes (Signed)
[name], female   DOB: 12/08/1929, 85 y.o.   MRN: [id number]  Reason for Consult: Consult (Pessary fitting)   Referred by Einar Pheasant, MD  Subjective:     HPI:  Sandra Reyes is a 85 y.o. female  She presents today with complaints of a vaginal bulge. She says it feels like there is a tennis ball between her legs. She has had a hysterectomy in the past (1997) as well as a anterior and posterior repair. She reports that this helped her symptoms of prolapse for some time, but that it has recently occurred again. She denies issues with incontinence such as cough laugh sneeze leakage. She reports some urinary frequency. She denies constipation. She reports a daily bowel movement. She denies splinting, but reports that sometimes she does touch her bottom with toilet paper to help with having a bowel movement.   Past Medical History:  Diagnosis Date  . Anxiety   . Collagen vascular disease ([place])   . Degenerative disc disease   . Depression   . GERD (gastroesophageal reflux disease)   . Hyperlipidemia   . Hypertension    Family History  Problem Relation Age of Onset  . Heart disease Mother   . Arthritis Mother   . Ulcers Father        uremic poisoning  . Arthritis Sister        x 4  . Hyperlipidemia Sister        x 1  . Hypertension Sister   . Cancer Sister        x 2 (ovarian cancer) & 1 (colon cancer)  . Diabetes Sister    Past Surgical History:  Procedure Laterality Date  . ABDOMINAL HYSTERECTOMY  1997   partial  . BREAST SURGERY  1960 to 1970   lumps removed  . CYSTOCELE REPAIR    . ESOPHAGOGASTRODUODENOSCOPY (EGD) WITH PROPOFOL N/A 10/03/2016   Procedure: ESOPHAGOGASTRODUODENOSCOPY (EGD) WITH PROPOFOL;  Surgeon: Manya Silvas, MD;  [facility] END[facility]COPY;  Service: Endoscopy;  Laterality: N/A;  . RECTOCELE REPAIR      Short Social History:  Social History   Tobacco Use  . Smoking status: Never Smoker  . Smokeless tobacco: Never Used   Substance Use Topics  . Alcohol use: No    Alcohol/week: 0.0 standard drinks    Allergies  Allergen Reactions  . Celexa [Citalopram]   . Effexor [Venlafaxine]   . Lipitor [Atorvastatin]   . Macrobid [Nitrofurantoin Macrocrystal]   . Nortriptyline   . Penicillin V Potassium     Has patient had a PCN reaction causing immediate rash, facial/tongue/throat swelling, SOB or lightheadedness with hypotension: Yes Has patient had a PCN reaction causing severe rash involving mucus membranes or skin necrosis: No Has patient had a PCN reaction that required hospitalization No Has patient had a PCN reaction occurring within the last 10 years: No If all of the above answers are "NO", then may proceed with Cephalosporin use.  . Prozac [Fluoxetine]   . Sulfa Antibiotics Rash    Current Outpatient Medications  Medication Sig Dispense Refill  . Alpha-Lipoic Acid 300 MG TABS Take 1 tablet (300 mg total) by mouth daily. (Patient taking differently: Take 0.5 tablets by mouth daily. ) 90 tablet 1  . ALPRAZolam (XANAX) 0.5 MG tablet TAKE 1 TABLET BY MOUTH AT BEDTIME AND 1/2 TABLET DURING THE DAY 45 tablet 0  . Ascorbic Acid (VITAMIN C PO) Take by mouth.    Marland Kitchen aspirin  81 MG tablet Take 81 mg by mouth daily.    . famotidine (PEPCID) 20 MG tablet Take 1 tablet (20 mg total) by mouth daily. 90 tablet 1  . meloxicam (MOBIC) 7.5 MG tablet TAKE 1 TABLET (7.5 MG TOTAL) BY MOUTH DAILY AS NEEDED FOR PAIN. 30 tablet 0  . metoprolol succinate (TOPROL-XL) 25 MG 24 hr tablet TAKE ONE-HALF TABLET BY  MOUTH TWICE A DAY 90 tablet 0  . Multiple Vitamin (MULTIVITAMIN) tablet Take 1 tablet by mouth daily.    . ranitidine (ZANTAC) 150 MG tablet TAKE 1 TABLET BY MOUTH  DAILY 30 MINUTES BEFORE  BREAKFAST 90 tablet 1  . vitamin C (ASCORBIC ACID) 250 MG tablet Take 250 mg by mouth daily.    . vitamin E 400 UNIT capsule Take 400 Units by mouth daily.     No current facility-administered medications for this visit.     REVIEW  OF SYSTEMS      Objective:  Objective   Vitals:   03/31/19 1026  BP: 128/70  Pulse: 62  Weight: 184 lb (83.5 kg)  Height: 5\' 1"  (1.549 m)   Body mass index is 34.77 kg/m.  Physical Exam Vitals signs and nursing note reviewed.  Constitutional:      Appearance: She is well-developed.  HENT:     Head: Normocephalic and atraumatic.  Eyes:     Pupils: Pupils are equal, round, and reactive to light.  Cardiovascular:     Rate and Rhythm: Normal rate and regular rhythm.  Pulmonary:     Effort: Pulmonary effort is normal. No respiratory distress.  Abdominal:     General: Abdomen is flat.     Palpations: Abdomen is soft.  Skin:    General: Skin is warm and dry.  Neurological:     Mental Status: She is alert and oriented to person, place, and time.  Psychiatric:        Behavior: Behavior normal.        Thought Content: Thought content normal.        Judgment: Judgment normal.     Pelvic exam:  She had preserved perineal sensation and reflexes.     EG/BUS/vaginal:  atrophic (or) normal estrogen effect, no lesions or discharge Cervix:   absent Uterus:  surgically absent Adnexae:  No masses Anorectum: No Hemorrhoids Anal Sphincter tone: normal  Pelvic floor muscle strength (Kegel) =     1 out of 4  POP-Q exam:      Aa = +3 Ba = +5 C = 10  gH = 5 pb = 3 TVL = 10  Ap = +3 BP = +5 D =     Summary statement of POP-Q exam:    the anterior vaginal wall is 5 cm in front of the level of the hymen the cuff / cervix is 5 cm  cm in front of the level of the hymen,  and the posterior vaginal wall is 5cm in front of  the level of the hymen  Pessary Fitting Patient presents for a pessary fitting. She desires a pessary as her means of controlling her symptoms of prolapse and/or urinary incontinence. She understands the care needed for a pessary and desires to proceed. Alternative treatment options have been discussed at length and the patient voices an understanding of each  option.   PROCEDURE: The patient was placed in dorsal lithotomy position. Examination confirmed prolapse. A 3 donut pessary was fitted without difficulty. The patient subsequently ambulated, voided and performed valsalva  maneuvers without dislodging the pessary and without discomfort. Care instructions were provided. Patient will return for pessary placement with a pessary she can go home with.       Assessment/Plan:     85 yo with stage 4 total prolapse Size 3 donut fit well and patient tolerated after testing the pessary by walking, using the restroom and sitting. Will order a pessary for her and have her return for placement.   More than 60 minutes were spent face to face with the patient in the room with more than 50% of the time spent providing counseling and discussing the plan of management.    Adrian Prows MD [facility] 03/31/2019 1:23 PM

## 2019-04-07 ENCOUNTER — Encounter: Payer: Self-pay | Admitting: Obstetrics and Gynecology

## 2019-04-07 ENCOUNTER — Other Ambulatory Visit: Payer: Self-pay

## 2019-04-07 ENCOUNTER — Ambulatory Visit (INDEPENDENT_AMBULATORY_CARE_PROVIDER_SITE_OTHER): Payer: Medicare Other | Admitting: Obstetrics and Gynecology

## 2019-04-07 VITALS — BP 132/74 | HR 71 | Wt 185.0 lb

## 2019-04-07 DIAGNOSIS — N816 Rectocele: Secondary | ICD-10-CM

## 2019-04-07 DIAGNOSIS — N8110 Cystocele, unspecified: Secondary | ICD-10-CM

## 2019-04-07 NOTE — Progress Notes (Signed)
Pessary Fitting Patient presents for a pessary fitting. She desires a pessary as her means of controlling her symptoms of prolapse and/or urinary incontinence. She understands the care needed for a pessary and desires to proceed. Alternative treatment options have been discussed at length and the patient voices an understanding of each option.   PROCEDURE: The patient was placed in dorsal lithotomy position. Examination confirmed prolapse. A 3 donut pessary was fitted without difficulty. The patient subsequently ambulated, voided and performed valsalva maneuvers without dislodging the pessary and without discomfort. Care instructions were provided. Patient was discharged to home in stable condition.   Follow up in 3 months for a cleaning and vaginal inspection.  Adrian Prows MD [facility] 04/07/2019 10:36 AM

## 2019-04-12 ENCOUNTER — Encounter: Payer: Self-pay | Admitting: Obstetrics and Gynecology

## 2019-04-12 DIAGNOSIS — N813 Complete uterovaginal prolapse: Secondary | ICD-10-CM | POA: Insufficient documentation

## 2019-04-20 ENCOUNTER — Encounter: Payer: Self-pay | Admitting: Internal Medicine

## 2019-04-20 ENCOUNTER — Ambulatory Visit (INDEPENDENT_AMBULATORY_CARE_PROVIDER_SITE_OTHER): Payer: Medicare Other | Admitting: Internal Medicine

## 2019-04-20 ENCOUNTER — Other Ambulatory Visit: Payer: Self-pay

## 2019-04-20 DIAGNOSIS — I10 Essential (primary) hypertension: Secondary | ICD-10-CM | POA: Diagnosis not present

## 2019-04-20 DIAGNOSIS — K219 Gastro-esophageal reflux disease without esophagitis: Secondary | ICD-10-CM | POA: Diagnosis not present

## 2019-04-20 DIAGNOSIS — E7800 Pure hypercholesterolemia, unspecified: Secondary | ICD-10-CM

## 2019-04-20 DIAGNOSIS — F419 Anxiety disorder, unspecified: Secondary | ICD-10-CM

## 2019-04-20 DIAGNOSIS — R739 Hyperglycemia, unspecified: Secondary | ICD-10-CM

## 2019-04-20 DIAGNOSIS — I700 Atherosclerosis of aorta: Secondary | ICD-10-CM | POA: Diagnosis not present

## 2019-04-20 DIAGNOSIS — N813 Complete uterovaginal prolapse: Secondary | ICD-10-CM

## 2019-04-20 NOTE — Progress Notes (Signed)
[name], female   DOB: 1930-11-05, 85 y.o.   MRN: [id number]   Virtual Visit via telephone Note  This visit type was conducted due to national recommendations for restrictions regarding the COVID-19 pandemic (e.g. social distancing).  This format is felt to be most appropriate for this patient at this time.  All issues noted in this document were discussed and addressed.  No physical exam was performed (except for noted visual exam findings with Video Visits).   I connected with Lasya Westcott by telephone and verified that I am speaking with the correct person using two identifiers. Location patient: home Location provider: work Persons participating in the telephone visit: patient, provider  I discussed the limitations, risks, security and privacy concerns of performing an evaluation and management service by telephone and the availability of in person appointments.  The patient expressed understanding and agreed to proceed.   Reason for visit: scheduled follow up.   HPI: She reports she is doing relatively well.  Tries to stay active.  No chest pain.  Breathing stable.  No acid reflux.  No abdominal pain.  Bowels moving.  Seeing OB/Gyn - Dr Gilman Schmidt.  Had pessary placed.  Trying to get adjusted.  Plans to f/u with gyn.  No dizziness.  Swallowing ok.  Declines cholesterol medication.  Declines colonoscopy and mammogram.  Blood pressures averaging 140s/60-70s.  Feels better.     ROS: See pertinent positives and negatives per HPI.  Past Medical History:  Diagnosis Date  . Anxiety   . Collagen vascular disease ([place])   . Degenerative disc disease   . Depression   . GERD (gastroesophageal reflux disease)   . Hyperlipidemia   . Hypertension     Past Surgical History:  Procedure Laterality Date  . ABDOMINAL HYSTERECTOMY  1997   uterus and cervix removed, reports she still has her ovaries  . BREAST SURGERY  1960 to 1970   lumps removed  . CYSTOCELE REPAIR    .  ESOPHAGOGASTRODUODENOSCOPY (EGD) WITH PROPOFOL N/A 10/03/2016   Procedure: ESOPHAGOGASTRODUODENOSCOPY (EGD) WITH PROPOFOL;  Surgeon: Manya Silvas, MD;  [facility] ENDOSCOPY;  Service: Endoscopy;  Laterality: N/A;  . RECTOCELE REPAIR      Family History  Problem Relation Age of Onset  . Heart disease Mother   . Arthritis Mother   . Ulcers Father        uremic poisoning  . Arthritis Sister        x 4  . Hyperlipidemia Sister        x 1  . Hypertension Sister   . Cancer Sister        x 2 (ovarian cancer) & 1 (colon cancer)  . Diabetes Sister     SOCIAL HX: reviewed.    Current Outpatient Medications:  .  Alpha-Lipoic Acid 300 MG TABS, Take 1 tablet (300 mg total) by mouth daily. (Patient taking differently: Take 0.5 tablets by mouth daily. ), Disp: 90 tablet, Rfl: 1 .  ALPRAZolam (XANAX) 0.5 MG tablet, TAKE 1 TABLET BY MOUTH AT BEDTIME AND 1/2 TABLET DURING THE DAY, Disp: 45 tablet, Rfl: 0 .  Ascorbic Acid (VITAMIN C PO), Take by mouth., Disp: , Rfl:  .  aspirin 81 MG tablet, Take 81 mg by mouth daily., Disp: , Rfl:  .  famotidine (PEPCID) 20 MG tablet, Take 1 tablet (20 mg total) by mouth daily., Disp: 90 tablet, Rfl: 1 .  meloxicam (MOBIC) 7.5 MG tablet, TAKE 1 TABLET (7.5 MG  TOTAL) BY MOUTH DAILY AS NEEDED FOR PAIN., Disp: 30 tablet, Rfl: 0 .  metoprolol succinate (TOPROL-XL) 25 MG 24 hr tablet, TAKE ONE-HALF TABLET BY  MOUTH TWICE A DAY, Disp: 90 tablet, Rfl: 0 .  Multiple Vitamin (MULTIVITAMIN) tablet, Take 1 tablet by mouth daily., Disp: , Rfl:  .  vitamin C (ASCORBIC ACID) 250 MG tablet, Take 250 mg by mouth daily., Disp: , Rfl:  .  vitamin E 400 UNIT capsule, Take 400 Units by mouth daily., Disp: , Rfl:  .  ranitidine (ZANTAC) 150 MG tablet, TAKE 1 TABLET BY MOUTH  DAILY 30 MINUTES BEFORE  BREAKFAST (Patient not taking: Reported on 04/20/2019), Disp: 90 tablet, Rfl: 1  EXAM:  VITALS per patient if applicable: 292/90, 70 and 145/70, 62  GENERAL: alert, oriented,  appears well and in no acute distress  HEENT: atraumatic, conjunttiva clear, no obvious abnormalities on inspection of external nose and ears  NECK: normal movements of the head and neck  LUNGS: on inspection no signs of respiratory distress, breathing rate appears normal, no obvious gross SOB, gasping or wheezing  CV: no obvious cyanosis  PSYCH/NEURO: pleasant and cooperative, no obvious depression or anxiety, speech and thought processing grossly intact  ASSESSMENT AND PLAN:  Discussed the following assessment and plan:  Anxiety  Aortic atherosclerosis (HCC)  Cystocele and rectocele with complete uterovaginal prolapse  Essential hypertension, benign  Gastroesophageal reflux disease, esophagitis presence not specified  Hypercholesterolemia  Hyperglycemia  Anxiety Doing better.  Rarely uses xanax.  Follow.    Aortic atherosclerosis (HCC) Declines cholesterol medication.    Cystocele and rectocele with complete uterovaginal prolapse Saw gyn.  Has pessary.  Continue f/u with gyn.   Essential hypertension, benign Blood pressure as outlined.  Continue current medication.  Follow pressures.  Follow metabolic panel.   GERD (gastroesophageal reflux disease) Controlled.    Hypercholesterolemia Declines cholesterol medication.  Low cholesterol diet and exercise.  Follow lipid panel.  She will call to schedule labs. Did not want me to schedule.    Hyperglycemia Low carb diet and exercise.  Follow met b and a1c.      I discussed the assessment and treatment plan with the patient. The patient was provided an opportunity to ask questions and all were answered. The patient agreed with the plan and demonstrated an understanding of the instructions.   The patient was advised to call back or seek an in-person evaluation if the symptoms worsen or if the condition fails to improve as anticipated.  I provided 15 minutes of non-face-to-face time during this encounter.   Einar Pheasant, MD

## 2019-04-21 ENCOUNTER — Ambulatory Visit: Payer: Medicare Other | Admitting: Obstetrics and Gynecology

## 2019-04-23 ENCOUNTER — Encounter: Payer: Self-pay | Admitting: Internal Medicine

## 2019-04-23 NOTE — Assessment & Plan Note (Signed)
Doing better.  Rarely uses xanax.  Follow.

## 2019-04-23 NOTE — Assessment & Plan Note (Signed)
Low carb diet and exercise.  Follow met b and a1c.   

## 2019-04-23 NOTE — Assessment & Plan Note (Addendum)
Declines cholesterol medication.  Low cholesterol diet and exercise.  Follow lipid panel.  She will call to schedule labs. Did not want me to schedule.

## 2019-04-23 NOTE — Assessment & Plan Note (Signed)
Declines cholesterol medication.   

## 2019-04-23 NOTE — Assessment & Plan Note (Signed)
Blood pressure as outlined.  Continue current medication.  Follow pressures.  Follow metabolic panel.  

## 2019-04-23 NOTE — Assessment & Plan Note (Signed)
Controlled.  

## 2019-04-23 NOTE — Assessment & Plan Note (Signed)
Saw gyn.  Has pessary.  Continue f/u with gyn.

## 2019-04-25 ENCOUNTER — Other Ambulatory Visit: Payer: Self-pay | Admitting: Internal Medicine

## 2019-04-26 ENCOUNTER — Other Ambulatory Visit: Payer: Self-pay | Admitting: Internal Medicine

## 2019-04-26 NOTE — Telephone Encounter (Signed)
Copied from [place] [id number]. Topic: Quick Communication - Rx Refill/Question >> Apr 26, 2019  4:17 PM Nils Flack, Marland Kitchen wrote: Medication: ALPRAZolam Duanne Moron) 0.5 MG tablet  Has the patient contacted their pharmacy? Yes.   (Agent: If no, request that the patient contact the pharmacy for the refill.) (Agent: If yes, when and what did the pharmacy advise?)  Preferred Pharmacy (with phone number or street name): [facility]   Agent: Please be advised that RX refills may take up to 3 business days. We ask that you follow-up with your pharmacy.

## 2019-04-27 MED ORDER — ALPRAZOLAM 0.5 MG PO TABS: ORAL_TABLET | ORAL | 1 refills | Status: DC

## 2019-04-27 NOTE — Telephone Encounter (Signed)
rx ok'd and sent to [facility] for xanax #45 with one refill.

## 2019-04-27 NOTE — Telephone Encounter (Signed)
Last OV 04/20/19 Next OV none scheduled Last refill 02/20/19

## 2019-05-01 ENCOUNTER — Telehealth: Payer: Self-pay

## 2019-05-01 NOTE — Telephone Encounter (Signed)
Pt called; wants a smaller pessary ordered for her to have put in whenever [facility] can see her so she doesn't have to come in twice.  [phone]

## 2019-05-01 NOTE — Telephone Encounter (Signed)
Please advise. Thank you

## 2019-05-05 NOTE — Telephone Encounter (Signed)
Called and discussed with patient- she is having pressure with urge to urinate. Unable to make it to the bathroom quick enough- would like to ty a smaller pessary- size 2 ordered

## 2019-05-15 ENCOUNTER — Telehealth: Payer: Self-pay | Admitting: Obstetrics and Gynecology

## 2019-05-15 NOTE — Telephone Encounter (Signed)
Patient is schedule 05/18/19

## 2019-05-15 NOTE — Telephone Encounter (Signed)
-----   Message from [place], LPN sent at 08/16/9149 10:34 AM EDT ----- Regarding: Pessary Pessary has arrived please schedule apt for placement w/CS

## 2019-05-15 NOTE — Telephone Encounter (Signed)
You okay with taking it out any day soon? I can call and schedule her

## 2019-05-15 NOTE — Telephone Encounter (Signed)
Pt calling; would like to come in as soon as possible to have pessary taken out; will have the other one put in later; wants to see how she does without it.  Please let her know in the next 2-3 days.  [phone]

## 2019-05-15 NOTE — Telephone Encounter (Signed)
Yes that's fine 

## 2019-05-18 ENCOUNTER — Other Ambulatory Visit: Payer: Self-pay

## 2019-05-18 ENCOUNTER — Encounter: Payer: Self-pay | Admitting: Obstetrics and Gynecology

## 2019-05-18 ENCOUNTER — Ambulatory Visit: Payer: Medicare Other | Admitting: Obstetrics and Gynecology

## 2019-05-18 VITALS — BP 152/90 | HR 61 | Ht 61.0 in | Wt 184.0 lb

## 2019-05-18 DIAGNOSIS — N816 Rectocele: Secondary | ICD-10-CM | POA: Diagnosis not present

## 2019-05-18 DIAGNOSIS — N813 Complete uterovaginal prolapse: Secondary | ICD-10-CM | POA: Diagnosis not present

## 2019-05-18 DIAGNOSIS — N8110 Cystocele, unspecified: Secondary | ICD-10-CM | POA: Diagnosis not present

## 2019-05-18 NOTE — Progress Notes (Signed)
[name], female   DOB: 05/25/1930, 85 y.o.   MRN: [id number]  Reason for Consult: Follow-up (Pessary follow up, having to pee everytime she gets up )   Referred by Einar Pheasant, MD  Subjective:     HPI:  Sandra Reyes is a 85 y.o. female  Here for removal of pessary. She has bee struggling with constipation and urinary frequency since placement of the pessary.    Past Medical History:  Diagnosis Date   Anxiety    Collagen vascular disease ([place])    Degenerative disc disease    Depression    GERD (gastroesophageal reflux disease)    Hyperlipidemia    Hypertension    Family History  Problem Relation Age of Onset   Heart disease Mother    Arthritis Mother    Ulcers Father        uremic poisoning   Arthritis Sister        x 4   Hyperlipidemia Sister        x 1   Hypertension Sister    Cancer Sister        x 2 (ovarian cancer) & 1 (colon cancer)   Diabetes Sister    Past Surgical History:  Procedure Laterality Date   ABDOMINAL HYSTERECTOMY  1997   uterus and cervix removed, reports she still has her ovaries   BREAST SURGERY  1960 to 1970   lumps removed   CYSTOCELE REPAIR     ESOPHAGOGASTRODUODENOSCOPY (EGD) WITH PROPOFOL N/A 10/03/2016   Procedure: ESOPHAGOGASTRODUODENOSCOPY (EGD) WITH PROPOFOL;  Surgeon: Manya Silvas, MD;  Location: [facility] ENDOSCOPY;  Service: Endoscopy;  Laterality: N/A;   RECTOCELE REPAIR      Short Social History:  Social History   Tobacco Use   Smoking status: Never Smoker   Smokeless tobacco: Never Used  Substance Use Topics   Alcohol use: No    Alcohol/week: 0.0 standard drinks    Allergies  Allergen Reactions   Celexa [Citalopram]    Effexor [Venlafaxine]    Lipitor [Atorvastatin]    Macrobid [Nitrofurantoin Macrocrystal]    Nortriptyline    Penicillin V Potassium     Has patient had a PCN reaction causing immediate rash, facial/tongue/throat swelling, SOB or lightheadedness  with hypotension: Yes Has patient had a PCN reaction causing severe rash involving mucus membranes or skin necrosis: No Has patient had a PCN reaction that required hospitalization No Has patient had a PCN reaction occurring within the last 10 years: No If all of the above answers are "NO", then may proceed with Cephalosporin use.   Prozac [Fluoxetine]    Sulfa Antibiotics Rash    Current Outpatient Medications  Medication Sig Dispense Refill   Alpha-Lipoic Acid 300 MG TABS Take 1 tablet (300 mg total) by mouth daily. (Patient taking differently: Take 0.5 tablets by mouth daily. ) 90 tablet 1   ALPRAZolam (XANAX) 0.5 MG tablet TAKE 1 TABLET BY MOUTH AT BEDTIME AND 1/2 TABLET DURING THE DAY 45 tablet 1   Ascorbic Acid (VITAMIN C PO) Take by mouth.     aspirin 81 MG tablet Take 81 mg by mouth daily.     famotidine (PEPCID) 20 MG tablet TAKE 1 TABLET BY MOUTH  DAILY 90 tablet 1   meloxicam (MOBIC) 7.5 MG tablet TAKE 1 TABLET (7.5 MG TOTAL) BY MOUTH DAILY AS NEEDED FOR PAIN. 30 tablet 0   metoprolol succinate (TOPROL-XL) 25 MG 24 hr tablet TAKE ONE-HALF TABLET BY  MOUTH  TWICE A DAY 90 tablet 0   Multiple Vitamin (MULTIVITAMIN) tablet Take 1 tablet by mouth daily.     ranitidine (ZANTAC) 150 MG tablet TAKE 1 TABLET BY MOUTH  DAILY 30 MINUTES BEFORE  BREAKFAST 90 tablet 1   vitamin E 400 UNIT capsule Take 400 Units by mouth daily.     vitamin C (ASCORBIC ACID) 250 MG tablet Take 250 mg by mouth daily.     No current facility-administered medications for this visit.     Review of Systems  Constitutional: Negative for chills, fatigue, fever and unexpected weight change.  HENT: Negative for trouble swallowing.  Eyes: Negative for loss of vision.  Respiratory: Negative for cough, shortness of breath and wheezing.  Cardiovascular: Negative for chest pain, leg swelling, palpitations and syncope.  GI: Negative for abdominal pain, blood in stool, diarrhea, nausea and vomiting.  GU:  Negative for difficulty urinating, dysuria, frequency and hematuria.  Musculoskeletal: Negative for back pain, leg pain and joint pain.  Skin: Negative for rash.  Neurological: Negative for dizziness, headaches, light-headedness, numbness and seizures.  Psychiatric: Negative for behavioral problem, confusion, depressed mood and sleep disturbance.        Objective:  Objective   Vitals:   05/18/19 1038  BP: (!) 152/90  Pulse: 61  Weight: 184 lb (83.5 kg)  Height: 5\' 1"  (1.549 m)   Body mass index is 34.77 kg/m.  Physical Exam Vitals signs and nursing note reviewed.  Constitutional:      Appearance: She is well-developed.  HENT:     Head: Normocephalic and atraumatic.  Eyes:     Pupils: Pupils are equal, round, and reactive to light.  Cardiovascular:     Rate and Rhythm: Normal rate and regular rhythm.  Pulmonary:     Effort: Pulmonary effort is normal. No respiratory distress.  Skin:    General: Skin is warm and dry.  Neurological:     Mental Status: She is alert and oriented to person, place, and time.  Psychiatric:        Behavior: Behavior normal.        Thought Content: Thought content normal.        Judgment: Judgment normal.   Removal of pessary from vagina.  Speculum examination showed intact mucosa. Copious yellow/green discharge. Vagina irrigated with 120 cc of normal saline and flushed clean. No ointment applied.      Assessment/Plan:     85 yo with stage 4 prolapse Unsatisfied with pessary at this time. Declines resizing. Declines medication for urinary frequency. Advised that she can use half water have betadine douche to help with vaginal discharge and odor.   More than 15 minutes were spent face to face with the patient in the room with more than 50% of the time spent providing counseling and discussing the plan of management.    Adrian Prows MD [facility] 05/18/2019 11:30 AM

## 2019-05-23 ENCOUNTER — Other Ambulatory Visit: Payer: Self-pay | Admitting: Internal Medicine

## 2019-06-20 ENCOUNTER — Other Ambulatory Visit: Payer: Self-pay | Admitting: Internal Medicine

## 2019-06-20 NOTE — Telephone Encounter (Signed)
rx ok'd for alprazolam #45 with one refill.

## 2019-08-01 ENCOUNTER — Other Ambulatory Visit: Payer: Self-pay | Admitting: Internal Medicine

## 2019-08-01 NOTE — Telephone Encounter (Signed)
/  Requested medication (s) are due for refill today: yes  Requested medication (s) are on the active medication list: yes  Last refill:  11/07/2018  Future visit scheduled:yes  Notes to clinic: review for refill    Requested Prescriptions  Pending Prescriptions Disp Refills   meloxicam (MOBIC) 7.5 MG tablet 30 tablet 0    Sig: Take 1 tablet (7.5 mg total) by mouth daily as needed for pain.     Analgesics:  COX2 Inhibitors Failed - 08/01/2019 10:48 AM      Failed - HGB in normal range and within 360 days    Hemoglobin  Date Value Ref Range Status  06/04/2017 13.7 12.0 - 15.0 g/dL Final         Failed - Cr in normal range and within 360 days    Creatinine, Ser  Date Value Ref Range Status  07/01/2018 0.79 0.40 - 1.20 mg/dL Final         Passed - Patient is not pregnant      Passed - Valid encounter within last 12 months    Recent Outpatient Visits          3 months ago [facility], Randell Patient, MD   10 months ago Routine general medical examination at a health care facility   [facility], MD   1 year ago Aortic atherosclerosis [facility])   [facility] Fleming, MD   2 years ago Routine general medical examination at a health care facility   [facility], Randell Patient, MD   3 years ago Routine general medical examination at a health care facility   [facility], MD      Future Appointments            In 3 months Einar Pheasant, MD [place]

## 2019-08-01 NOTE — Telephone Encounter (Signed)
Medication Refill - Medication: meloxicam (MOBIC) 7.5 MG tablet  Has the patient contacted their pharmacy? No (Agent: If no, request that the patient contact the pharmacy for the refill.) (Agent: If yes, when and what did the pharmacy advise?)  Preferred Pharmacy (with phone number or street name):  [facility] [place] [phone] (Phone) [phone] (Fax)   Agent: Please be advised that RX refills may take up to 3 business days. We ask that you follow-up with your pharmacy.

## 2019-08-04 NOTE — Telephone Encounter (Signed)
Last OV 04/20/19 ok to fill?

## 2019-08-05 ENCOUNTER — Other Ambulatory Visit: Payer: Self-pay | Admitting: Internal Medicine

## 2019-08-05 NOTE — Telephone Encounter (Signed)
Need to call pt.  She is requesting refill on mobic.  Has not had rx since 10/2018.  Overdue labs.  Needs labs scheduled.  Also need to know why needs refill.  Need clarification before prescribing.

## 2019-08-21 ENCOUNTER — Emergency Department: Payer: Medicare Other

## 2019-08-21 ENCOUNTER — Inpatient Hospital Stay
Admission: EM | Admit: 2019-08-21 | Discharge: 2019-08-23 | DRG: 065 | Disposition: A | Discharge status: Home Health | Payer: Medicare Other | Attending: Internal Medicine | Admitting: Internal Medicine

## 2019-08-21 ENCOUNTER — Inpatient Hospital Stay: Payer: Medicare Other

## 2019-08-21 ENCOUNTER — Ambulatory Visit: Payer: Self-pay

## 2019-08-21 ENCOUNTER — Other Ambulatory Visit: Payer: Self-pay

## 2019-08-21 ENCOUNTER — Encounter: Payer: Self-pay | Admitting: Emergency Medicine

## 2019-08-21 DIAGNOSIS — Z881 Allergy status to other antibiotic agents status: Secondary | ICD-10-CM | POA: Diagnosis not present

## 2019-08-21 DIAGNOSIS — Z7982 Long term (current) use of aspirin: Secondary | ICD-10-CM | POA: Diagnosis not present

## 2019-08-21 DIAGNOSIS — R739 Hyperglycemia, unspecified: Secondary | ICD-10-CM | POA: Diagnosis not present

## 2019-08-21 DIAGNOSIS — F329 Major depressive disorder, single episode, unspecified: Secondary | ICD-10-CM | POA: Diagnosis present

## 2019-08-21 DIAGNOSIS — I700 Atherosclerosis of aorta: Secondary | ICD-10-CM | POA: Diagnosis not present

## 2019-08-21 DIAGNOSIS — R29703 NIHSS score 3: Secondary | ICD-10-CM | POA: Diagnosis present

## 2019-08-21 DIAGNOSIS — Z9181 History of falling: Secondary | ICD-10-CM

## 2019-08-21 DIAGNOSIS — Z66 Do not resuscitate: Secondary | ICD-10-CM | POA: Diagnosis not present

## 2019-08-21 DIAGNOSIS — F419 Anxiety disorder, unspecified: Secondary | ICD-10-CM | POA: Diagnosis present

## 2019-08-21 DIAGNOSIS — I63412 Cerebral infarction due to embolism of left middle cerebral artery: Secondary | ICD-10-CM | POA: Diagnosis not present

## 2019-08-21 DIAGNOSIS — Z880 Allergy status to penicillin: Secondary | ICD-10-CM | POA: Diagnosis not present

## 2019-08-21 DIAGNOSIS — R29810 Facial weakness: Secondary | ICD-10-CM | POA: Diagnosis present

## 2019-08-21 DIAGNOSIS — G629 Polyneuropathy, unspecified: Secondary | ICD-10-CM | POA: Diagnosis not present

## 2019-08-21 DIAGNOSIS — Z8249 Family history of ischemic heart disease and other diseases of the circulatory system: Secondary | ICD-10-CM | POA: Diagnosis not present

## 2019-08-21 DIAGNOSIS — Z8673 Personal history of transient ischemic attack (TIA), and cerebral infarction without residual deficits: Secondary | ICD-10-CM | POA: Diagnosis present

## 2019-08-21 DIAGNOSIS — Z791 Long term (current) use of non-steroidal anti-inflammatories (NSAID): Secondary | ICD-10-CM | POA: Diagnosis not present

## 2019-08-21 DIAGNOSIS — E785 Hyperlipidemia, unspecified: Secondary | ICD-10-CM | POA: Diagnosis not present

## 2019-08-21 DIAGNOSIS — I63532 Cerebral infarction due to unspecified occlusion or stenosis of left posterior cerebral artery: Secondary | ICD-10-CM | POA: Diagnosis not present

## 2019-08-21 DIAGNOSIS — Z8349 Family history of other endocrine, nutritional and metabolic diseases: Secondary | ICD-10-CM | POA: Diagnosis not present

## 2019-08-21 DIAGNOSIS — I10 Essential (primary) hypertension: Secondary | ICD-10-CM | POA: Diagnosis present

## 2019-08-21 DIAGNOSIS — Z79899 Other long term (current) drug therapy: Secondary | ICD-10-CM | POA: Diagnosis not present

## 2019-08-21 DIAGNOSIS — K219 Gastro-esophageal reflux disease without esophagitis: Secondary | ICD-10-CM | POA: Diagnosis not present

## 2019-08-21 DIAGNOSIS — Z833 Family history of diabetes mellitus: Secondary | ICD-10-CM | POA: Diagnosis not present

## 2019-08-21 DIAGNOSIS — S0990XA Unspecified injury of head, initial encounter: Secondary | ICD-10-CM | POA: Diagnosis not present

## 2019-08-21 DIAGNOSIS — E871 Hypo-osmolality and hyponatremia: Secondary | ICD-10-CM | POA: Diagnosis present

## 2019-08-21 DIAGNOSIS — M359 Systemic involvement of connective tissue, unspecified: Secondary | ICD-10-CM | POA: Diagnosis present

## 2019-08-21 DIAGNOSIS — I361 Nonrheumatic tricuspid (valve) insufficiency: Secondary | ICD-10-CM | POA: Diagnosis not present

## 2019-08-21 DIAGNOSIS — Z888 Allergy status to other drugs, medicaments and biological substances status: Secondary | ICD-10-CM

## 2019-08-21 DIAGNOSIS — Z882 Allergy status to sulfonamides status: Secondary | ICD-10-CM

## 2019-08-21 DIAGNOSIS — R262 Difficulty in walking, not elsewhere classified: Secondary | ICD-10-CM | POA: Diagnosis not present

## 2019-08-21 DIAGNOSIS — I639 Cerebral infarction, unspecified: Secondary | ICD-10-CM | POA: Diagnosis present

## 2019-08-21 DIAGNOSIS — Z20828 Contact with and (suspected) exposure to other viral communicable diseases: Secondary | ICD-10-CM | POA: Diagnosis present

## 2019-08-21 DIAGNOSIS — R410 Disorientation, unspecified: Secondary | ICD-10-CM | POA: Diagnosis not present

## 2019-08-21 LAB — CBC
HCT: 44.4 % (ref 36.0–46.0)
Hemoglobin: 14.9 g/dL (ref 12.0–15.0)
MCH: 30.3 pg (ref 26.0–34.0)
MCHC: 33.6 g/dL (ref 30.0–36.0)
MCV: 90.4 fL (ref 80.0–100.0)
Platelets: 318 10*3/uL (ref 150–400)
RBC: 4.91 MIL/uL (ref 3.87–5.11)
RDW: 13.4 % (ref 11.5–15.5)
WBC: 8.3 10*3/uL (ref 4.0–10.5)
nRBC: 0 % (ref 0.0–0.2)

## 2019-08-21 LAB — BASIC METABOLIC PANEL
Anion gap: 9 (ref 5–15)
BUN: 15 mg/dL (ref 8–23)
CO2: 27 mmol/L (ref 22–32)
Calcium: 8.9 mg/dL (ref 8.9–10.3)
Chloride: 97 mmol/L — ABNORMAL LOW (ref 98–111)
Creatinine, Ser: 0.81 mg/dL (ref 0.44–1.00)
GFR calc Af Amer: >60 mL/min (ref >60)
GFR calc non Af Amer: >60 mL/min (ref >60)
Glucose, Bld: 130 mg/dL — ABNORMAL HIGH (ref 70–99)
Potassium: 4.4 mmol/L (ref 3.5–5.1)
Sodium: 133 mmol/L — ABNORMAL LOW (ref 135–145)

## 2019-08-21 MED ORDER — VITAMIN E 180 MG (400 UNIT) PO CAPS
400.0000 [IU] | ORAL_CAPSULE | Freq: Every day | ORAL | Status: DC
  Administered 2019-08-22: 400 [IU] via ORAL
  Filled 2019-08-21 (×3): qty 1

## 2019-08-21 MED ORDER — SODIUM CHLORIDE 0.9 % IV SOLN
INTRAVENOUS | Status: DC
  Administered 2019-08-21 – 2019-08-22 (×2): via INTRAVENOUS

## 2019-08-21 MED ORDER — ACETAMINOPHEN 325 MG PO TABS: 650.0000 mg | ORAL_TABLET | ORAL | Status: DC | PRN

## 2019-08-21 MED ORDER — ASPIRIN EC 81 MG PO TBEC
81.0000 mg | DELAYED_RELEASE_TABLET | Freq: Every day | ORAL | Status: DC
  Administered 2019-08-22 – 2019-08-23 (×2): 81 mg via ORAL
  Filled 2019-08-21 (×2): qty 1

## 2019-08-21 MED ORDER — ASPIRIN 81 MG PO CHEW
324.0000 mg | CHEWABLE_TABLET | Freq: Once | ORAL | Status: AC
  Administered 2019-08-21: 17:00:00 324 mg via ORAL
  Filled 2019-08-21: qty 4

## 2019-08-21 MED ORDER — ROSUVASTATIN CALCIUM 20 MG PO TABS
20.0000 mg | ORAL_TABLET | Freq: Every day | ORAL | Status: DC
  Administered 2019-08-21 – 2019-08-22 (×2): 20 mg via ORAL
  Filled 2019-08-21 (×2): qty 1

## 2019-08-21 MED ORDER — METOPROLOL SUCCINATE ER 25 MG PO TB24
12.5000 mg | ORAL_TABLET | Freq: Two times a day (BID) | ORAL | Status: DC
  Administered 2019-08-22 – 2019-08-23 (×3): 12.5 mg via ORAL
  Filled 2019-08-21 (×3): qty 1

## 2019-08-21 MED ORDER — ALPRAZOLAM 0.25 MG PO TABS
0.2500 mg | ORAL_TABLET | Freq: Every evening | ORAL | Status: DC | PRN
  Administered 2019-08-21 – 2019-08-22 (×2): 0.25 mg via ORAL
  Filled 2019-08-21 (×2): qty 1

## 2019-08-21 MED ORDER — SODIUM CHLORIDE 0.9% FLUSH: 3.0000 mL | Freq: Once | INTRAVENOUS | Status: DC

## 2019-08-21 MED ORDER — ADULT MULTIVITAMIN W/MINERALS CH
1.0000 | ORAL_TABLET | Freq: Every day | ORAL | Status: DC
  Administered 2019-08-22 – 2019-08-23 (×2): 1 via ORAL
  Filled 2019-08-21 (×3): qty 1

## 2019-08-21 MED ORDER — ACETAMINOPHEN 160 MG/5ML PO SOLN
650.0000 mg | ORAL | Status: DC | PRN
  Filled 2019-08-21: qty 20.3

## 2019-08-21 MED ORDER — CLOPIDOGREL BISULFATE 75 MG PO TABS
75.0000 mg | ORAL_TABLET | Freq: Every day | ORAL | Status: DC
  Administered 2019-08-22 – 2019-08-23 (×2): 75 mg via ORAL
  Filled 2019-08-21 (×2): qty 1

## 2019-08-21 MED ORDER — IOHEXOL 350 MG/ML SOLN
75.0000 mL | Freq: Once | INTRAVENOUS | Status: AC | PRN
  Administered 2019-08-21: 75 mL via INTRAVENOUS

## 2019-08-21 MED ORDER — STROKE: EARLY STAGES OF RECOVERY BOOK
Freq: Once | Status: AC
  Administered 2019-08-21: 23:00:00

## 2019-08-21 MED ORDER — FAMOTIDINE 20 MG PO TABS
20.0000 mg | ORAL_TABLET | Freq: Every day | ORAL | Status: DC | PRN
  Administered 2019-08-22 – 2019-08-23 (×2): 20 mg via ORAL
  Filled 2019-08-21 (×2): qty 1

## 2019-08-21 MED ORDER — VITAMIN C 500 MG PO TABS
250.0000 mg | ORAL_TABLET | Freq: Every day | ORAL | Status: DC
  Administered 2019-08-22 – 2019-08-23 (×2): 250 mg via ORAL
  Filled 2019-08-21 (×3): qty 1

## 2019-08-21 MED ORDER — SENNOSIDES-DOCUSATE SODIUM 8.6-50 MG PO TABS: 1.0000 | ORAL_TABLET | Freq: Every evening | ORAL | Status: DC | PRN

## 2019-08-21 MED ORDER — ENOXAPARIN SODIUM 40 MG/0.4ML ~~LOC~~ SOLN
40.0000 mg | SUBCUTANEOUS | Status: DC
  Administered 2019-08-21 – 2019-08-22 (×2): 40 mg via SUBCUTANEOUS
  Filled 2019-08-21 (×2): qty 0.4

## 2019-08-21 MED ORDER — ACETAMINOPHEN 650 MG RE SUPP: 650.0000 mg | RECTAL | Status: DC | PRN

## 2019-08-21 MED ORDER — KETOROLAC TROMETHAMINE 30 MG/ML IJ SOLN
30.0000 mg | Freq: Once | INTRAMUSCULAR | Status: AC
  Administered 2019-08-21: 30 mg via INTRAMUSCULAR
  Filled 2019-08-21: qty 1

## 2019-08-21 NOTE — ED Notes (Signed)
Rainbow was sent to lab. 

## 2019-08-21 NOTE — Telephone Encounter (Signed)
Agree with ER evaluation.  Please confirm pt is evaluated today.  Thanks

## 2019-08-21 NOTE — H&P (Signed)
[facility] NAME: Sandra Reyes    MR#:  [id number]  DATE OF BIRTH:  30-Jun-1930  DATE OF ADMISSION:  08/21/2019  PRIMARY CARE PHYSICIAN: Einar Pheasant, MD   REQUESTING/REFERRING PHYSICIAN: Harvest Dark, MD  CHIEF COMPLAINT:   Chief Complaint  Patient presents with  . Fall    HISTORY OF PRESENT ILLNESS:  Sandra Reyes  is a 85 y.o. female with a known history of collagen vascular disease, hypertension, hyperlipidemia, depression, anxiety, GERD who presented to the ED after falling at home yesterday.  Patient had a mechanical fall due to "her legs feeling weak".  She was on the ground for about 2 hours before being found by her family.  Per daughter at bedside, patient was occasionally "speaking gibberish" and acting confused yesterday.  She slept on and off for the whole day.  This morning, daughter called patient's PCP, who recommended that patient come to the ED for further evaluation.  Patient denies any current weakness of her arms or legs, other than some generalized weakness.  She denies any numbness or tingling.  She denies any vision changes or speech changes.  She denies any chest pain, shortness of breath, palpitations.  Of note, patient states that her blood pressures have been as high as the [id number] systolic at home over the last couple of days.  In the ED, she was hypertensive with BP 175/73.  Labs were significant for sodium 133.  CT head showed a suspected nonhemorrhagic, subacute stroke in the left temporoparietal lobe.  MRI brain showed multiple infarcts involving the left temporal, parietal, and frontal lobes in the MCA vascular territory.  Hospitalists were called for admission.  PAST MEDICAL HISTORY:   Past Medical History:  Diagnosis Date  . Anxiety   . Collagen vascular disease ([place])   . Degenerative disc disease   . Depression   . GERD (gastroesophageal reflux disease)   . Hyperlipidemia   . Hypertension      PAST SURGICAL HISTORY:   Past Surgical History:  Procedure Laterality Date  . ABDOMINAL HYSTERECTOMY  1997   uterus and cervix removed, reports she still has her ovaries  . BREAST SURGERY  1960 to 1970   lumps removed  . CYSTOCELE REPAIR    . ESOPHAGOGASTRODUODENOSCOPY (EGD) WITH PROPOFOL N/A 10/03/2016   Procedure: ESOPHAGOGASTRODUODENOSCOPY (EGD) WITH PROPOFOL;  Surgeon: Manya Silvas, MD;  Location: [facility] ENDOSCOPY;  Service: Endoscopy;  Laterality: N/A;  . RECTOCELE REPAIR      SOCIAL HISTORY:   Social History   Tobacco Use  . Smoking status: Never Smoker  . Smokeless tobacco: Never Used  Substance Use Topics  . Alcohol use: No    Alcohol/week: 0.0 standard drinks    FAMILY HISTORY:   Family History  Problem Relation Age of Onset  . Heart disease Mother   . Arthritis Mother   . Ulcers Father        uremic poisoning  . Arthritis Sister        x 4  . Hyperlipidemia Sister        x 1  . Hypertension Sister   . Cancer Sister        x 2 (ovarian cancer) & 1 (colon cancer)  . Diabetes Sister     DRUG ALLERGIES:   Allergies  Allergen Reactions  . Celexa [Citalopram]   . Effexor [Venlafaxine]   . Lipitor [Atorvastatin]   . Macrobid [Nitrofurantoin Macrocrystal]   . Nortriptyline   .  Penicillin V Potassium     Has patient had a PCN reaction causing immediate rash, facial/tongue/throat swelling, SOB or lightheadedness with hypotension: Yes Has patient had a PCN reaction causing severe rash involving mucus membranes or skin necrosis: No Has patient had a PCN reaction that required hospitalization No Has patient had a PCN reaction occurring within the last 10 years: No If all of the above answers are "NO", then may proceed with Cephalosporin use.  . Prozac [Fluoxetine]   . Sulfa Antibiotics Rash    REVIEW OF SYSTEMS:   Review of Systems  Constitutional: Negative for chills and fever.  HENT: Negative for congestion and sore throat.   Eyes: Negative  for blurred vision and double vision.  Respiratory: Negative for cough and shortness of breath.   Cardiovascular: Negative for chest pain and palpitations.  Gastrointestinal: Negative for nausea and vomiting.  Genitourinary: Negative for dysuria and urgency.  Musculoskeletal: Positive for back pain. Negative for neck pain.  Neurological: Positive for weakness. Negative for dizziness, tingling, sensory change, speech change, focal weakness and headaches.  Psychiatric/Behavioral: Negative for depression. The patient is not nervous/anxious.     MEDICATIONS AT HOME:   Prior to Admission medications   Medication Sig Start Date End Date Taking? Authorizing Provider  ALPRAZolam Duanne Moron) 0.5 MG tablet TAKE 1/2 TABLET BY MOUTH ONCE DAILY AND 1 TABLET AT BEDTIME 06/20/19  Yes Einar Pheasant, MD  aspirin 81 MG tablet Take 81 mg by mouth daily.   Yes [provider]  famotidine (PEPCID) 20 MG tablet TAKE 1 TABLET BY MOUTH  DAILY Patient taking differently: Take 20 mg by mouth daily as needed.  04/25/19  Yes Einar Pheasant, MD  metoprolol succinate (TOPROL-XL) 25 MG 24 hr tablet TAKE ONE-HALF TABLET BY  MOUTH TWICE DAILY 08/08/19  Yes Einar Pheasant, MD  Multiple Vitamin (MULTIVITAMIN) tablet Take 1 tablet by mouth daily.   Yes [provider]  vitamin C (ASCORBIC ACID) 250 MG tablet Take 250 mg by mouth daily.   Yes [provider]  vitamin E 400 UNIT capsule Take 400 Units by mouth daily.   Yes [provider]      VITAL SIGNS:  Blood pressure (!) 175/73, pulse 60, temperature 98.8 F (37.1 C), temperature source Oral, resp. rate 16, height 5' (1.524 m), weight 81.6 kg, SpO2 100 %.  PHYSICAL EXAMINATION:  Physical Exam  GENERAL:  85 y.o.-year-old patient lying in the bed with no acute distress.  EYES: Pupils equal, round, reactive to light and accommodation. No scleral icterus. Extraocular muscles intact.  HEENT: Head atraumatic, normocephalic. Oropharynx and  nasopharynx clear.  NECK:  Supple, no jugular venous distention. No thyroid enlargement, no tenderness.  LUNGS: Normal breath sounds bilaterally, no wheezing, rales,rhonchi or crepitation. No use of accessory muscles of respiration.  CARDIOVASCULAR: RRR, S1, S2 normal. No murmurs, rubs, or gallops.  ABDOMEN: Soft, nontender, nondistended. Bowel sounds present. No organomegaly or mass.  EXTREMITIES: No pedal edema, cyanosis, or clubbing.  NEUROLOGIC: Cranial nerves II through XII are intact. Muscle strength 5/5 in all extremities. Sensation intact to light touch throughout.  Normal finger-to-nose testing bilaterally.  Gait not checked.  PSYCHIATRIC: The patient is alert and oriented x 3.  SKIN: No obvious rash, lesion, or ulcer.   LABORATORY PANEL:   CBC Recent Labs  Lab 08/21/19 1231  WBC 8.3  HGB 14.9  HCT 44.4  PLT 318   ------------------------------------------------------------------------------------------------------------------  Chemistries  Recent Labs  Lab 08/21/19 1231  NA 133*  K  4.4  CL 97*  CO2 27  GLUCOSE 130*  BUN 15  CREATININE 0.81  CALCIUM 8.9   ------------------------------------------------------------------------------------------------------------------  Cardiac Enzymes No results for input(s): TROPONINI in the last 168 hours. ------------------------------------------------------------------------------------------------------------------  RADIOLOGY:  Ct Head Wo Contrast  Result Date: 08/21/2019 CLINICAL DATA:  Fall yesterday with minor head injury. Patient reports a weird sensation prior to falling. No history of malignancy. EXAM: CT HEAD WITHOUT CONTRAST TECHNIQUE: Contiguous axial images were obtained from the base of the skull through the vertex without intravenous contrast. COMPARISON:  Limited correlation made with orbital CT 08/16/2017. FINDINGS: Brain: There is an ill-defined low-density lesion in the left temporoparietal lobe, measuring  2.8 x 2.2 cm on image 15/2. This extends to the cortex and is suspicious for a subacute stroke. Neoplasm, inflammation and focal contusion are less likely. There is no evidence of acute intracranial hemorrhage, other mass lesion, extra-axial fluid collection or midline shift. There is no hydrocephalus. The ventricles and subarachnoid spaces are appropriately sized for age. There is no CT evidence of acute cortical infarction. Vascular: Intracranial vascular calcifications. No hyperdense vessel identified. Skull: Negative for fracture or focal lesion. Sinuses/Orbits: The visualized paranasal sinuses and mastoid air cells are clear. No orbital abnormalities are seen. Other: None. IMPRESSION: 1. Suspected nonhemorrhagic, subacute stroke in the left temporoparietal lobe. Neoplasm, inflammation and focal contusion are less likely. Recommend further evaluation with brain MRI without and with contrast. 2. No evidence of acute hemorrhage, midline shift or extra-axial fluid collection. Electronically Signed   By: Richardean Sale M.D.   On: 08/21/2019 15:07   Mr Brain Wo Contrast  Result Date: 08/21/2019 CLINICAL DATA:  Stroke, follow-up. Additional history provided: Patient had mechanical fall yesterday, denies any head injury or loss of consciousness. Patient reported felt "weird" prior to falling. EXAM: MRI HEAD WITHOUT CONTRAST TECHNIQUE: Multiplanar, multiecho pulse sequences of the brain and surrounding structures were obtained without intravenous contrast. COMPARISON:  Head CT 08/21/2019 FINDINGS: Brain: There is an acute infarct within the posterior left temporal lobe and left parietal lobe measuring 2.8 x 3.0 cm in transaxial dimensions. There is more subtle restricted diffusion within the left caudate nucleus and left putamen also consistent with acute infarct. Additionally, there is a punctate acute infarct within the left frontoparietal lobe along the margin of the left lateral ventricle (series 5, image 33).  There is an adjacent small subtle acute cortical infarct within the left parietal lobe (series 5, image 33). An additional acute punctate cortical infarct is present within the left temporal lobe more anteriorly (series 5, image 23). Corresponding T2/FLAIR hyperintensity at these sites. No midline shift or extra-axial fluid collection. No evidence of chronic intracranial blood products. No evidence intracranial mass. Mild generalized parenchymal atrophy. Partially empty sella turcica. Vascular: No definite loss of flow voids within the proximal large arterial vessels. Skull and upper cervical spine: Normal marrow signal. Incompletely assessed cervical spondylosis. Sinuses/Orbits: Imaged globes and orbits demonstrate no acute abnormality. Trace ethmoid sinus mucosal thickening. No significant mastoid effusion. IMPRESSION: Multiple acute infarcts involving the left temporal, parietal and frontal lobes in the MCA vascular territory, the largest within the left temporoparietal lobe measuring 2.8 x 3.0 cm in transaxial dimensions. Acute infarcts are also present within the left caudate and putamen. These results were called by telephone at the time of interpretation on 08/21/2019 at 5:12 pm to provider [facility] , who verbally acknowledged these results. No midline shift or evidence of hemorrhagic conversion. Electronically Signed   By: Kellie Simmering  On: 08/21/2019 17:12      IMPRESSION AND PLAN:   Acute CVA- MRI brain with multiple acute infarcts in the left MCA territory, likely embolic stroke. -Given aspirin 324 mg x 1 in the ED, will continue home aspirin 81mg  daily and start plavix -CTA head/neck ordered -ECHO bubble study ordered -Start Crestor (patient has an allergy to Lipitor) -A1c and lipid panel -Neurology consult -PT/OT/SLP  Hyponatremia-may be due to some mild dehydration, as well as some hyperglycemia -Gentle IV fluids  Hyperglycemia- patient not have a diagnosis of diabetes -A1c  pending  Hypertension- BP elevated in the ED -Will allow for permissive hypertension  Chronic anxiety- stable -Continue home xanax  All the records are reviewed and case discussed with ED provider. Management plans discussed with the patient, family and they are in agreement.  CODE STATUS: DNR  TOTAL TIME TAKING CARE OF THIS PATIENT: 45 minutes.    Berna Spare  M.D on 08/21/2019 at 6:23 PM  Between 7am to 6pm - Pager [phone]  After 6pm go to [email or web address] - Proofreader  [facility]  Office  [phone]  CC: Primary care physician; Einar Pheasant, MD   Note: This dictation was prepared with Dragon dictation along with smaller phrase technology. Any transcriptional errors that result from this process are unintentional.

## 2019-08-21 NOTE — ED Notes (Signed)
AAOx3.  Skin warm and dry.  Patient states she does not want to give a urine specimen at this time, she would prefer to let Dr. Nicki Reaper look at her urine.  She states she is ready to go home.

## 2019-08-21 NOTE — ED Notes (Signed)
ED TO INPATIENT HANDOFF REPORT  ED Nurse Name and Phone #:  Ritesh Opara/butch 16  S Name/Age/Gender Sandra Reyes 85 y.o. female Room/Bed: ED30A/ED30A  Code Status   Code Status: Prior  Home/SNF/Other Home Patient oriented to: self, place, time and situation Is this baseline? Yes   Triage Complete: Triage complete  Chief Complaint fall  Triage Note PT had mechanical fall yesterday , denies any head injury or LOC. States she felt "weird" prior to falling. Denies dizziness or CP. Denies any anticoag use. Pt is A&OX4, VSS    Allergies Allergies  Allergen Reactions  . Celexa [Citalopram]   . Effexor [Venlafaxine]   . Lipitor [Atorvastatin]   . Macrobid [Nitrofurantoin Macrocrystal]   . Nortriptyline   . Penicillin V Potassium     Has patient had a PCN reaction causing immediate rash, facial/tongue/throat swelling, SOB or lightheadedness with hypotension: Yes Has patient had a PCN reaction causing severe rash involving mucus membranes or skin necrosis: No Has patient had a PCN reaction that required hospitalization No Has patient had a PCN reaction occurring within the last 10 years: No If all of the above answers are "NO", then may proceed with Cephalosporin use.  . Prozac [Fluoxetine]   . Sulfa Antibiotics Rash    Level of Care/Admitting Diagnosis ED Disposition    ED Disposition Condition [facility] Area: [place] [[id number]]  Level of Care: Med-Surg [16]  Covid Evaluation: Asymptomatic Screening Protocol (No Symptoms)  Diagnosis: Acute CVA (cerebrovascular accident)  [id number]  Admitting Physician: Hyman Bible DODD [id number]  Attending Physician: Hyman Bible DODD [id number]  Estimated length of stay: past midnight tomorrow  Certification:: I certify this patient will need inpatient services for at least 2 midnights  PT Class (Do Not Modify): Inpatient [101]  PT Acc Code (Do Not Modify): Private [1]       B Medical/Surgery  History Past Medical History:  Diagnosis Date  . Anxiety   . Collagen vascular disease ([place])   . Degenerative disc disease   . Depression   . GERD (gastroesophageal reflux disease)   . Hyperlipidemia   . Hypertension    Past Surgical History:  Procedure Laterality Date  . ABDOMINAL HYSTERECTOMY  1997   uterus and cervix removed, reports she still has her ovaries  . BREAST SURGERY  1960 to 1970   lumps removed  . CYSTOCELE REPAIR    . ESOPHAGOGASTRODUODENOSCOPY (EGD) WITH PROPOFOL N/A 10/03/2016   Procedure: ESOPHAGOGASTRODUODENOSCOPY (EGD) WITH PROPOFOL;  Surgeon: Manya Silvas, MD;  Location[facility] ENDOSCOPY;  Service: Endoscopy;  Laterality: N/A;  . [place]       A IV Location/Drains/Wounds Patient Lines/Drains/Airways Status   Active Line/Drains/Airways    Name:   Placement date:   Placement time:   Site:   Days:   Peripheral IV 08/21/19 Left Antecubital   08/21/19    1732    Antecubital   less than 1          Intake/Output Last 24 hours No intake or output data in the 24 hours ending 08/21/19 1938  Labs/Imaging Results for orders placed or performed during the hospital encounter of 08/21/19 (from the past 48 hour(s))  Basic metabolic panel     Status: Abnormal   Collection Time: 08/21/19 12:31 PM  Result Value Ref Range   Sodium 133 (L) 135 - 145 mmol/L   Potassium 4.4 3.5 - 5.1 mmol/L   Chloride 97 (L) 98 - 111 mmol/L  CO2 27 22 - 32 mmol/L   Glucose, Bld 130 (H) 70 - 99 mg/dL   BUN 15 8 - 23 mg/dL   Creatinine, Ser 0.81 0.44 - 1.00 mg/dL   Calcium 8.9 8.9 - 10.3 mg/dL   GFR calc non Af Amer >60 >60 mL/min   GFR calc Af Amer >60 >60 mL/min   Anion gap 9 5 - 15    Comment: Performed at [facility], [address] [place], [place] [address]  CBC     Status: None   Collection Time: 08/21/19 12:31 PM  Result Value Ref Range   WBC 8.3 4.0 - 10.5 K/uL   RBC 4.91 3.87 - 5.11 MIL/uL   Hemoglobin 14.9 12.0 - 15.0 g/dL   HCT 44.4 36.0 -  46.0 %   MCV 90.4 80.0 - 100.0 fL   MCH 30.3 26.0 - 34.0 pg   MCHC 33.6 30.0 - 36.0 g/dL   RDW 13.4 11.5 - 15.5 %   Platelets 318 150 - 400 K/uL   nRBC 0.0 0.0 - 0.2 %    Comment: Performed at [facility], [address], [place], [place] [address]   Ct Head Wo Contrast  Result Date: 08/21/2019 CLINICAL DATA:  Fall yesterday with minor head injury. Patient reports a weird sensation prior to falling. No history of malignancy. EXAM: CT HEAD WITHOUT CONTRAST TECHNIQUE: Contiguous axial images were obtained from the base of the skull through the vertex without intravenous contrast. COMPARISON:  Limited correlation made with orbital CT 08/16/2017. FINDINGS: Brain: There is an ill-defined low-density lesion in the left temporoparietal lobe, measuring 2.8 x 2.2 cm on image 15/2. This extends to the cortex and is suspicious for a subacute stroke. Neoplasm, inflammation and focal contusion are less likely. There is no evidence of acute intracranial hemorrhage, other mass lesion, extra-axial fluid collection or midline shift. There is no hydrocephalus. The ventricles and subarachnoid spaces are appropriately sized for age. There is no CT evidence of acute cortical infarction. Vascular: Intracranial vascular calcifications. No hyperdense vessel identified. Skull: Negative for fracture or focal lesion. Sinuses/Orbits: The visualized paranasal sinuses and mastoid air cells are clear. No orbital abnormalities are seen. Other: None. IMPRESSION: 1. Suspected nonhemorrhagic, subacute stroke in the left temporoparietal lobe. Neoplasm, inflammation and focal contusion are less likely. Recommend further evaluation with brain MRI without and with contrast. 2. No evidence of acute hemorrhage, midline shift or extra-axial fluid collection. Electronically Signed   By: Richardean Sale M.D.   On: 08/21/2019 15:07   Mr Brain Wo Contrast  Result Date: 08/21/2019 CLINICAL DATA:  Stroke, follow-up. Additional history  provided: Patient had mechanical fall yesterday, denies any head injury or loss of consciousness. Patient reported felt "weird" prior to falling. EXAM: MRI HEAD WITHOUT CONTRAST TECHNIQUE: Multiplanar, multiecho pulse sequences of the brain and surrounding structures were obtained without intravenous contrast. COMPARISON:  Head CT 08/21/2019 FINDINGS: Brain: There is an acute infarct within the posterior left temporal lobe and left parietal lobe measuring 2.8 x 3.0 cm in transaxial dimensions. There is more subtle restricted diffusion within the left caudate nucleus and left putamen also consistent with acute infarct. Additionally, there is a punctate acute infarct within the left frontoparietal lobe along the margin of the left lateral ventricle (series 5, image 33). There is an adjacent small subtle acute cortical infarct within the left parietal lobe (series 5, image 33). An additional acute punctate cortical infarct is present within the left temporal lobe more anteriorly (series  5, image 23). Corresponding T2/FLAIR hyperintensity at these sites. No midline shift or extra-axial fluid collection. No evidence of chronic intracranial blood products. No evidence intracranial mass. Mild generalized parenchymal atrophy. Partially empty sella turcica. Vascular: No definite loss of flow voids within the proximal large arterial vessels. Skull and upper cervical spine: Normal marrow signal. Incompletely assessed cervical spondylosis. Sinuses/Orbits: Imaged globes and orbits demonstrate no acute abnormality. Trace ethmoid sinus mucosal thickening. No significant mastoid effusion. IMPRESSION: Multiple acute infarcts involving the left temporal, parietal and frontal lobes in the MCA vascular territory, the largest within the left temporoparietal lobe measuring 2.8 x 3.0 cm in transaxial dimensions. Acute infarcts are also present within the left caudate and putamen. These results were called by telephone at the time of  interpretation on 08/21/2019 at 5:12 pm to provider [facility] , who verbally acknowledged these results. No midline shift or evidence of hemorrhagic conversion. Electronically Signed   By: Kellie Simmering   On: 08/21/2019 17:12    Pending Labs Unresulted Labs (From admission, onward)    Start     Ordered   08/21/19 1724  SARS CORONAVIRUS 2 (TAT 6-24 HRS) Nasopharyngeal Nasopharyngeal Swab  (Asymptomatic/Tier 2 Patients Labs)  Once,   STAT    Question Answer Comment  Is this test for diagnosis or screening Screening   Symptomatic for COVID-19 as defined by CDC No   Hospitalized for COVID-19 No   Admitted to ICU for COVID-19 No   Previously tested for COVID-19 No   Resident in a congregate (group) care setting No   Employed in healthcare setting No   Pregnant No      08/21/19 1723   08/21/19 1231  Urinalysis, Complete w Microscopic  ONCE - STAT,   STAT     08/21/19 1230   Signed and Held  Hemoglobin A1c  Tomorrow morning,   R     Signed and Held   Signed and Held  Lipid panel  Tomorrow morning,   R    Comments: Fasting    Signed and Held          Vitals/Pain Today's Vitals   08/21/19 1229 08/21/19 1442 08/21/19 1551 08/21/19 1908  BP:    (!) 160/66  Pulse:    (!) 56  Resp:    16  Temp:    98.4 F (36.9 C)  TempSrc: Oral   Oral  SpO2:    97%  Weight:      Height:      PainSc: 0-No pain 0-No pain 0-No pain     Isolation Precautions No active isolations  Medications Medications  sodium chloride flush (NS) 0.9 % injection 3 mL (3 mLs Intravenous Not Given 08/21/19 1716)  ketorolac (TORADOL) 30 MG/ML injection 30 mg (30 mg Intramuscular Given 08/21/19 1547)  aspirin chewable tablet 324 mg (324 mg Oral Given 08/21/19 1719)    Mobility walks with person assist Moderate fall risk   Focused Assessments Neuro Assessment Handoff:  Swallow screen pass? Yes          Neuro Assessment: Within Defined Limits Neuro Checks:      Last Documented NIHSS Modified  Score:   Has TPA been given? No  If patient is a Neuro Trauma and patient is going to OR before floor call report to [place] nurse: [phone] or [phone]     R Recommendations: See Admitting Provider Note  Report given to:   Additional Notes:

## 2019-08-21 NOTE — Progress Notes (Signed)
Family Meeting Note  Advance Directive:yes  Today a meeting took place with the Patient and daughter.  Patient is able to participate.  The following clinical team members were present during this meeting:MD  The following were discussed:Patient's diagnosis: acute CVA, Patient's progosis: Unable to determine and Goals for treatment: DNR  Additional follow-up to be provided: prn  Time spent during discussion:20 minutes  Evette Doffing, MD

## 2019-08-21 NOTE — ED Triage Notes (Signed)
PT had mechanical fall yesterday , denies any head injury or LOC. States she felt "weird" prior to falling. Denies dizziness or CP. Denies any anticoag use. Pt is A&OX4, VSS

## 2019-08-21 NOTE — Telephone Encounter (Signed)
Pt's daughter called to report that her mother fell yesterday and laid several hours in water unable to get up or get help.  She states that she remembers feeling strange and falling straight back.  She pulled a sink from the wall with the fall.  Daughter Jiles Prows states that yesterday her mother was very confused and the confusion lingers today. She refused to go to ER for evaluation yesterday but EMS was call. Today she has pelvic pain "panty area" per daughter.  She is walking with her walker. No bumps to her head. She has had Ibuprofen for pain. Her mother continues to be confused about simple things such as using the remote for the TV. Per protocol patient will go to ER for evaluation. Care advice was given. Note will be routed to PCP.  Reason for Disposition . Patient sounds very sick or weak to the triager  Answer Assessment - Initial Assessment Questions 1. LEVEL OF CONSCIOUSNESS: "How is he (she, the patient) acting right now?" (e.g., alert-oriented, confused, lethargic, stuporous, comatose)     Confused but alert 2. ONSET: "When did the confusion start?"  (minutes, hours, days)     Yesterday after fall 3. PATTERN "Does this come and go, or has it been constant since it started?"  "Is it present now?"     constant 4. ALCOHOL or DRUGS: "Has he been drinking alcohol or taking any drugs?"     No 5. NARCOTIC MEDICATIONS: "Has he been receiving any narcotic medications?" (e.g., morphine, Vicodin)     No zanax at bedtime 6. CAUSE: "What do you think is causing the confusion?"     Unsure but fell and was alone approx 2 hours 7. OTHER SYMPTOMS: "Are there any other symptoms?" (e.g., difficulty breathing, headache, fever, weakness)     no  Protocols used: CONFUSION - DELIRIUM-A-AH

## 2019-08-21 NOTE — ED Notes (Signed)
Patient transported to CT 

## 2019-08-21 NOTE — ED Notes (Signed)
Family with pt

## 2019-08-21 NOTE — ED Provider Notes (Signed)
[facility] Emergency Department Provider Note  Time seen: 2:31 PM  I have reviewed the triage vital signs and the nursing notes.   HISTORY  Chief Complaint Fall   HPI Sandra Reyes is a 85 y.o. female with a past medical history of anxiety, depression, gastric reflux, hypertension, hyperlipidemia, presents to the emergency department after a fall.  According to the patient she states she was putting on make-up yesterday when she turned around and fell down to the ground.  Patient is not sure if she tripped or slipped or what happened.  Denies any weakness denies feeling like she was going to pass out, etc.  Patient denies any chest pain.  Does not believe she hit her head.  Denies LOC.  Patient did have to lay on the floor for approximately 2 hours until family arrived and helped her up with the help of paramedics.  Did not want to be evaluated yesterday.  Daughter states yesterday she was very tired after the fall, and seemed confused at times.  States today she is back to normal however they called her primary care doctor as a precaution and they recommend they come to the emergency department for evaluation.  Patient denies any pain.  Patient has been ambulatory today without issue.   Past Medical History:  Diagnosis Date  . Anxiety   . Collagen vascular disease ([place])   . Degenerative disc disease   . Depression   . GERD (gastroesophageal reflux disease)   . Hyperlipidemia   . Hypertension     Patient Active Problem List   Diagnosis Date Noted  . Cystocele and rectocele with complete uterovaginal prolapse 04/12/2019  . Aortic atherosclerosis ([place]) 05/15/2017  . Diaphragmatic hernia 10/02/2016  . Back skin lesion 03/15/2016  . GERD (gastroesophageal reflux disease) 09/08/2015  . Hyperglycemia 09/08/2015  . Health care maintenance 03/10/2015  . Peripheral neuropathy 02/18/2014  . Vision changes 02/18/2014  . Chronic back pain 05/06/2013  . Anxiety  05/06/2013  . Essential hypertension, benign 05/04/2013  . Hypercholesterolemia 05/04/2013    Past Surgical History:  Procedure Laterality Date  . ABDOMINAL HYSTERECTOMY  1997   uterus and cervix removed, reports she still has her ovaries  . BREAST SURGERY  1960 to 1970   lumps removed  . CYSTOCELE REPAIR    . ESOPHAGOGASTRODUODENOSCOPY (EGD) WITH PROPOFOL N/A 10/03/2016   Procedure: ESOPHAGOGASTRODUODENOSCOPY (EGD) WITH PROPOFOL;  Surgeon: Manya Silvas, MD;  Location[facility] ENDOSCOPY;  Service: Endoscopy;  Laterality: N/A;  . RECTOCELE REPAIR      Prior to Admission medications   Medication Sig Start Date End Date Taking? Authorizing Provider  Alpha-Lipoic Acid 300 MG TABS Take 1 tablet (300 mg total) by mouth daily. Patient taking differently: Take 0.5 tablets by mouth daily.  03/12/16   Einar Pheasant, MD  ALPRAZolam Duanne Moron) 0.5 MG tablet TAKE 1/2 TABLET BY MOUTH ONCE DAILY AND 1 TABLET AT BEDTIME 06/20/19   Einar Pheasant, MD  Ascorbic Acid (VITAMIN C PO) Take by mouth.    [provider]  aspirin 81 MG tablet Take 81 mg by mouth daily.    [provider]  famotidine (PEPCID) 20 MG tablet TAKE 1 TABLET BY MOUTH  DAILY 04/25/19   Einar Pheasant, MD  meloxicam (MOBIC) 7.5 MG tablet TAKE 1 TABLET (7.5 MG TOTAL) BY MOUTH DAILY AS NEEDED FOR PAIN. 12/20/17   Einar Pheasant, MD  metoprolol succinate (TOPROL-XL) 25 MG 24 hr tablet TAKE ONE-HALF TABLET BY  MOUTH TWICE  DAILY 08/08/19   Einar Pheasant, MD  Multiple Vitamin (MULTIVITAMIN) tablet Take 1 tablet by mouth daily.    [provider]  ranitidine (ZANTAC) 150 MG tablet TAKE 1 TABLET BY MOUTH  DAILY 30 MINUTES BEFORE  BREAKFAST 07/25/18   Einar Pheasant, MD  vitamin C (ASCORBIC ACID) 250 MG tablet Take 250 mg by mouth daily.    [provider]  vitamin E 400 UNIT capsule Take 400 Units by mouth daily.    [provider]    Allergies  Allergen Reactions  . Celexa [Citalopram]   .  Effexor [Venlafaxine]   . Lipitor [Atorvastatin]   . Macrobid [Nitrofurantoin Macrocrystal]   . Nortriptyline   . Penicillin V Potassium     Has patient had a PCN reaction causing immediate rash, facial/tongue/throat swelling, SOB or lightheadedness with hypotension: Yes Has patient had a PCN reaction causing severe rash involving mucus membranes or skin necrosis: No Has patient had a PCN reaction that required hospitalization No Has patient had a PCN reaction occurring within the last 10 years: No If all of the above answers are "NO", then may proceed with Cephalosporin use.  . Prozac [Fluoxetine]   . Sulfa Antibiotics Rash    Family History  Problem Relation Age of Onset  . Heart disease Mother   . Arthritis Mother   . Ulcers Father        uremic poisoning  . Arthritis Sister        x 4  . Hyperlipidemia Sister        x 1  . Hypertension Sister   . Cancer Sister        x 2 (ovarian cancer) & 1 (colon cancer)  . Diabetes Sister     Social History Social History   Tobacco Use  . Smoking status: Never Smoker  . Smokeless tobacco: Never Used  Substance Use Topics  . Alcohol use: No    Alcohol/week: 0.0 standard drinks  . Drug use: No    Review of Systems Constitutional: Negative for fever Cardiovascular: Negative for chest pain. Respiratory: Negative for shortness of breath.  Negative for cough. Gastrointestinal: Negative for abdominal pain Musculoskeletal: Negative for musculoskeletal complaints Skin: Negative for skin complaints  Neurological: Negative for headache All other ROS negative  ____________________________________________   PHYSICAL EXAM:  VITAL SIGNS: ED Triage Vitals  Enc Vitals Group     BP 08/21/19 1225 (!) 175/73     Pulse Rate 08/21/19 1225 60     Resp 08/21/19 1225 16     Temp 08/21/19 1225 98.8 F (37.1 C)     Temp Source 08/21/19 1225 Oral     SpO2 08/21/19 1225 100 %     Weight 08/21/19 1226 180 lb (81.6 kg)     Height 08/21/19  1226 5' (1.524 m)     Head Circumference --      Peak Flow --      Pain Score 08/21/19 1229 0     Pain Loc --      Pain Edu? --      Excl. in [place]? --     Constitutional: Alert and oriented. Well appearing and in no distress. Eyes: Normal exam ENT      Head: Normocephalic and atraumatic.      Mouth/Throat: Mucous membranes are moist. Cardiovascular: Normal rate, regular rhythm.  Respiratory: Normal respiratory effort without tachypnea nor retractions. Breath sounds are clear Gastrointestinal: Soft and nontender. No distention.   Musculoskeletal: Nontender with normal  range of motion in all extremities. Neurologic:  Normal speech and language. No gross focal neurologic deficits Skin:  Skin is warm, dry and intact.  Psychiatric: Mood and affect are normal.   ____________________________________________    EKG  EKG viewed and interpreted by myself shows a normal sinus rhythm at 62 bpm with a narrow QRS, normal axis, normal intervals, no concerning ST changes.  ____________________________________________    RADIOLOGY  CT scan of the head shows subacute stroke in the left temporoparietal area.  ____________________________________________   INITIAL IMPRESSION / ASSESSMENT AND PLAN / ED COURSE  Pertinent labs & imaging results that were available during my care of the patient were reviewed by me and considered in my medical decision making (see chart for details).   Patient presents to the emergency department for a fall yesterday.  Differential would include metabolic or electrolyte abnormality, intracranial injury, urinary tract infection, mechanical fall.  We will check labs, urinalysis and a CT scan of the head as a precaution.  Patient agreeable to plan of care.  CT scan of the head shows subacute stroke in the left parietal area.  We will proceed with MR imaging to further evaluate.  MRI of the brain shows multiple acute infarcts.  Likely embolic showering.  We will  discuss with medicine for admission patient will need further work-up and likely neurology consultation.  Patient agreeable to plan of care.   BUNNY HORT was evaluated in Emergency Department on 08/21/2019 for the symptoms described in the history of present illness. She was evaluated in the context of the global COVID-19 pandemic, which necessitated consideration that the patient might be at risk for infection with the SARS-CoV-2 virus that causes COVID-19. Institutional protocols and algorithms that pertain to the evaluation of patients at risk for COVID-19 are in a state of rapid change based on information released by regulatory bodies including the CDC and federal and state organizations. These policies and algorithms were followed during the patient's care in the ED.  ____________________________________________   FINAL CLINICAL IMPRESSION(S) / ED DIAGNOSES  Fall CVA   Harvest Dark, MD 08/21/19 [phone]

## 2019-08-22 ENCOUNTER — Inpatient Hospital Stay (HOSPITAL_COMMUNITY)
Admit: 2019-08-22 | Discharge: 2019-08-22 | Disposition: A | Discharge status: Home / Self Care | Payer: Medicare Other | Attending: Internal Medicine | Admitting: Internal Medicine

## 2019-08-22 DIAGNOSIS — I639 Cerebral infarction, unspecified: Secondary | ICD-10-CM

## 2019-08-22 DIAGNOSIS — I361 Nonrheumatic tricuspid (valve) insufficiency: Secondary | ICD-10-CM

## 2019-08-22 LAB — LIPID PANEL
Cholesterol: 209 mg/dL — ABNORMAL HIGH (ref 0–200)
HDL: 41 mg/dL (ref >40)
LDL Cholesterol: 147 mg/dL — ABNORMAL HIGH (ref 0–99)
Total CHOL/HDL Ratio: 5.1 RATIO
Triglycerides: 106 mg/dL (ref <150)
VLDL: 21 mg/dL (ref 0–40)

## 2019-08-22 LAB — SARS CORONAVIRUS 2 (TAT 6-24 HRS): SARS Coronavirus 2: NEGATIVE

## 2019-08-22 MED ORDER — ALUM & MAG HYDROXIDE-SIMETH 200-200-20 MG/5ML PO SUSP
30.0000 mL | ORAL | Status: DC | PRN
  Filled 2019-08-22: qty 30

## 2019-08-22 NOTE — Evaluation (Signed)
Occupational Therapy Evaluation Patient Details Name: Sandra Reyes MRN: [id number] DOB: 12/28/1929 Today's Date: 08/22/2019    History of Present Illness presented to ER secondary to mechanical fall due to LE weakness, "speaking gibberish" and AMS; admitted for TIA/CVA work up.  MRI significant for L temporal, parietal and frontal lobes, L caudate and putamen infarcts.   Clinical Impression   Pt seen for OT evaluation this date. Prior to hospital admission, pt was living by herself, driving very short familiar distances (by daughter's reply, pt is driving more than family realized), and denies falls or difficulty caring for herself prior to this.  Pt reports being independent and eager to leave the hospital, and required encouragement to participate. Pt denies deficits and neuro assessment truncated 2/2 pt being disagreeable. Abbreviated situational safety assessment and pt able to appropriately respond but required encouragement, perseverating on wish to go home and denying difficulty with anything. Daughter present throughout and reports that pt seems to be "acting like her normal self." Currently pt demonstrates impairments in safety awareness requiring at least supervision assist for safety putting her at high risk for falls or injury. Pt would benefit from skilled OT to address noted impairments and functional limitations (see below for any additional details) in order to maximize safety and independence while minimizing falls risk and caregiver burden.  Upon hospital discharge, recommend pt discharge with [facility] services to further assess pt's functional performance in her own environment. Encouraged family members to provide initial supervision to maximize safety.     Follow Up Recommendations  Supervision - Intermittent;Home health OT    Equipment Recommendations  None recommended by OT    Recommendations for Other Services       Precautions / Restrictions Precautions Precautions:  Fall Restrictions Weight Bearing Restrictions: No      Mobility Bed Mobility Overal bed mobility: Modified Independent                Transfers Overall transfer level: Needs assistance   Transfers: Sit to/from Stand Sit to Stand: Min guard              Balance Overall balance assessment: Needs assistance Sitting-balance support: No upper extremity supported;Feet supported Sitting balance-Leahy Scale: Good     Standing balance support: No upper extremity supported Standing balance-Leahy Scale: Poor                             ADL either performed or assessed with clinical judgement   ADL                                         General ADL Comments: close supervision assist for safety, CGA for functional ADL transfers and mobility     Vision Baseline Vision/History: Wears glasses Wears Glasses: At all times Patient Visual Report: No change from baseline Vision Assessment?: No apparent visual deficits     Perception     Praxis      Pertinent Vitals/Pain Pain Assessment: No/denies pain     Hand Dominance Right   Extremity/Trunk Assessment Upper Extremity Assessment Upper Extremity Assessment: Overall WFL for tasks assessed(grossly 4/5 throughout; no focal weakness, sensory or coordination deficits appreciated)   Lower Extremity Assessment Lower Extremity Assessment: Overall WFL for tasks assessed(grossly 4/5 throughout; no focal weakness, sensory or coordination deficits appreciated)       Communication  Communication Communication: No difficulties   Cognition Arousal/Alertness: Awake/alert Behavior During Therapy: WFL for tasks assessed/performed Overall Cognitive Status: Difficult to assess                                 General Comments: oriented to self, location and general situation; unaware of date, limited insight into deficits, needs for assist; pt's daughter present and reports that pt  appears to be at baseline   General Comments       Exercises Other Exercises Other Exercises: Abbreviated situational safety exercise with pt able to respond appropriately, slightly confrontational, perseverates slightly stating "Well, no one knows what's going to happen, it could happen to anyone" when answering questions Other Exercises: Encouraged family member present to look for cognitive/behavioral concerns, initial supervision for safety, review medications and ensure pt is able to independently manage   Shoulder Instructions      Home Living Family/patient expects to be discharged to:: Private residence Living Arrangements: Alone Available Help at Discharge: Family;Available PRN/intermittently;Available 24 hours/day(many family, planning to stay with her 24/7 initially) Type of Home: House Home Access: Level entry     Home Layout: One level               Home Equipment: None          Prior Functioning/Environment Level of Independence: Independent with assistive device(s)        Comments: Mod indep with 4WR[employer] for ADLs, household  and limited community distances; denies additional fall history outside of this event. Drives very short distances        OT Problem List: Decreased cognition;Impaired balance (sitting and/or standing);Decreased knowledge of use of DME or AE;Decreased safety awareness      OT Treatment/Interventions: Self-care/ADL training;Therapeutic exercise;Therapeutic activities;Cognitive remediation/compensation;Neuromuscular education;DME and/or AE instruction;Patient/family education;Balance training    OT Goals(Current goals can be found in the care plan section) Acute Rehab OT Goals Patient Stated Goal: to return home OT Goal Formulation: With patient/family Time For Goal Achievement: 09/05/19 Potential to Achieve Goals: Good ADL Goals Additional ADL Goal #1: Pt will perform daily am ADL routine with supervision and no verbal cues for  safety.  OT Frequency: Min 1X/week   Barriers to D/C:            Co-evaluation              AM-PAC OT "6 Clicks" Daily Activity     Outcome Measure Help from another person eating meals?: None Help from another person taking care of personal grooming?: None Help from another person toileting, which includes using toliet, bedpan, or urinal?: A Little Help from another person bathing (including washing, rinsing, drying)?: A Little Help from another person to put on and taking off regular upper body clothing?: None Help from another person to put on and taking off regular lower body clothing?: A Little 6 Click Score: 21   End of Session    Activity Tolerance: Patient tolerated treatment well;Treatment limited secondary to agitation Patient left: in bed;with call bell/phone within reach;with bed alarm set;with family/visitor present  OT Visit Diagnosis: Other abnormalities of gait and mobility (R26.89);Other symptoms and signs involving cognitive function                Time: [id number] OT Time Calculation (min): 17 min Charges:  OT General Charges $OT Visit: 1 Visit OT Evaluation $OT Eval Moderate Complexity: 1 Mod  Jeni Salles, MPH, MS, OTR/L  ascom [phone] 08/22/19, 3:05 PM

## 2019-08-22 NOTE — TOC Initial Note (Signed)
Transition of Care [facility]) - Initial/Assessment Note    Patient Details  Name: Sandra Reyes MRN: [id number] Date of Birth: 1930/08/21  [facility] CM/SW Contact:    Candie Chroman, LCSW Phone Number: 08/22/2019, 1:58 PM  Clinical Narrative: CSW met with patient. Daughter at bedside. CSW introduced role and explained that PT recommendations would be discussed. Patient is not interested in home health PT at this time. She states she typically lays in bed and reads most of the day. Patient uses a walker at home and feels steady with it. Provided CMS scores for agencies that serve her zip code and told her to follow up with her PCP if she gets home and changes her mind. No further concerns. CSW encouraged patient and her daughter to contact CSW as needed. CSW will continue to follow patient and her daughter for support and facilitate return home when stable.                 Expected Discharge Plan: Home/Self Care Barriers to Discharge: Continued Medical Work up   Patient Goals and CMS Choice   CMS Medicare.gov Compare Post Acute Care list provided to:: Patient(Daughter at bedside.)    Expected Discharge Plan and Services Expected Discharge Plan: Home/Self Care     Post Acute Care Choice: NA Living arrangements for the past 2 months: Single Family Home                 DME Arranged: N/A         HH Arranged: NA          Prior Living Arrangements/Services Living arrangements for the past 2 months: Single Family Home Lives with:: Self Patient language and need for interpreter reviewed:: Yes Do you feel safe going back to the place where you live?: Yes      Need for Family Participation in Patient Care: Yes (Comment) Care giver support system in place?: Yes (comment) Current home services: DME Criminal Activity/Legal Involvement Pertinent to Current Situation/Hospitalization: No - Comment as needed  Activities of Daily Living Home Assistive Devices/Equipment:  Environmental consultant (specify type), Shower chair with back(rolling) ADL Screening (condition at time of admission) Patient's cognitive ability adequate to safely complete daily activities?: Yes Is the patient deaf or have difficulty hearing?: No Does the patient have difficulty seeing, even when wearing glasses/contacts?: No Does the patient have difficulty concentrating, remembering, or making decisions?: Yes Patient able to express need for assistance with ADLs?: Yes Does the patient have difficulty dressing or bathing?: Yes Independently performs ADLs?: No Communication: Independent Dressing (OT): Needs assistance Is this a change from baseline?: Change from baseline, expected to last <3days Grooming: Needs assistance Is this a change from baseline?: Change from baseline, expected to last <3 days Feeding: Independent Bathing: Needs assistance Is this a change from baseline?: Change from baseline, expected to last <3 days Toileting: Needs assistance Is this a change from baseline?: Change from baseline, expected to last <3 days In/Out Bed: Needs assistance Is this a change from baseline?: Change from baseline, expected to last <3 days Walks in Home: Independent with device (comment) Does the patient have difficulty walking or climbing stairs?: Yes Weakness of Legs: Both Weakness of Arms/Hands: Both  Permission Sought/Granted                  Emotional Assessment Appearance:: Appears stated age Attitude/Demeanor/Rapport: Engaged, Gracious Affect (typically observed): Accepting, Appropriate, Calm, Pleasant Orientation: : Oriented to Self, Oriented to Place, Oriented to  Time, Oriented to Situation Alcohol / Substance Use: Never Used Psych Involvement: No (comment)  Admission diagnosis:  Cerebrovascular accident (CVA), unspecified mechanism ([place]) [I63.9] Patient Active Problem List   Diagnosis Date Noted  . Acute CVA (cerebrovascular accident) ([place]) 08/21/2019  . Cystocele and  rectocele with complete uterovaginal prolapse 04/12/2019  . Aortic atherosclerosis ([place]) 05/15/2017  . Diaphragmatic hernia 10/02/2016  . Back skin lesion 03/15/2016  . GERD (gastroesophageal reflux disease) 09/08/2015  . Hyperglycemia 09/08/2015  . Health care maintenance 03/10/2015  . Peripheral neuropathy 02/18/2014  . Vision changes 02/18/2014  . Chronic back pain 05/06/2013  . Anxiety 05/06/2013  . Essential hypertension, benign 05/04/2013  . Hypercholesterolemia 05/04/2013   PCP:  Einar Pheasant, MD Pharmacy:   [place], [place] [address] [place] [address] Phone: [phone] Fax: [place], [place] [place] [address] Phone: [phone] Fax: [phone]     Social Determinants of Health (SDOH) Interventions    Readmission Risk Interventions No flowsheet data found.

## 2019-08-22 NOTE — Progress Notes (Signed)
*  PRELIMINARY RESULTS* Echocardiogram 2D Echocardiogram has been performed.  Sherrie Sport 08/22/2019, 9:11 AM

## 2019-08-22 NOTE — Progress Notes (Addendum)
Sandra Reyes at [facility] NAME: Sandra Reyes    MR#:  [id number]  DATE OF BIRTH:  06/27/1930  SUBJECTIVE:  CHIEF COMPLAINT:   Chief Complaint  Patient presents with  . Fall  Patient seen and evaluated today Speech has improved No numbness No focal deficits  REVIEW OF SYSTEMS:    ROS  CONSTITUTIONAL: No documented fever. No fatigue, weakness. No weight gain, no weight loss.  EYES: No blurry or double vision.  ENT: No tinnitus. No postnasal drip. No redness of the oropharynx.  RESPIRATORY: No cough, no wheeze, no hemoptysis. No dyspnea.  CARDIOVASCULAR: No chest pain. No orthopnea. No palpitations. No syncope.  GASTROINTESTINAL: No nausea, no vomiting or diarrhea. No abdominal pain. No melena or hematochezia.  GENITOURINARY: No dysuria or hematuria.  ENDOCRINE: No polyuria or nocturia. No heat or cold intolerance.  HEMATOLOGY: No anemia. No bruising. No bleeding.  INTEGUMENTARY: No rashes. No lesions.  MUSCULOSKELETAL: No arthritis. No swelling. No gout.  NEUROLOGIC: No numbness, tingling, or ataxia. No seizure-type activity.  PSYCHIATRIC: No anxiety. No insomnia. No ADD.   DRUG ALLERGIES:   Allergies  Allergen Reactions  . Celexa [Citalopram]   . Effexor [Venlafaxine]   . Lipitor [Atorvastatin]   . Macrobid [Nitrofurantoin Macrocrystal]   . Nortriptyline   . Penicillin V Potassium     Has patient had a PCN reaction causing immediate rash, facial/tongue/throat swelling, SOB or lightheadedness with hypotension: Yes Has patient had a PCN reaction causing severe rash involving mucus membranes or skin necrosis: No Has patient had a PCN reaction that required hospitalization No Has patient had a PCN reaction occurring within the last 10 years: No If all of the above answers are "NO", then may proceed with Cephalosporin use.  . Prozac [Fluoxetine]   . Sulfa Antibiotics Rash    VITALS:  Blood pressure (!) 157/54, pulse (!) 59,  temperature (!) 97.4 F (36.3 C), temperature source Oral, resp. rate 20, height 5' (1.524 m), weight 81.6 kg, SpO2 98 %.  PHYSICAL EXAMINATION:   Physical Exam  GENERAL:  85 y.o.-year-old patient lying in the bed with no acute distress.  EYES: Pupils equal, round, reactive to light and accommodation. No scleral icterus. Extraocular muscles intact.  HEENT: Head atraumatic, normocephalic. Oropharynx and nasopharynx clear.  NECK:  Supple, no jugular venous distention. No thyroid enlargement, no tenderness.  LUNGS: Normal breath sounds bilaterally, no wheezing, rales, rhonchi. No use of accessory muscles of respiration.  CARDIOVASCULAR: S1, S2 normal. No murmurs, rubs, or gallops.  ABDOMEN: Soft, nontender, nondistended. Bowel sounds present. No organomegaly or mass.  EXTREMITIES: No cyanosis, clubbing or edema b/l.    NEUROLOGIC: Cranial nerves II through XII are intact. No focal Motor or sensory deficits b/l.   PSYCHIATRIC: The patient is alert and oriented x 3.  SKIN: No obvious rash, lesion, or ulcer.   LABORATORY PANEL:   CBC Recent Labs  Lab 08/21/19 1231  WBC 8.3  HGB 14.9  HCT 44.4  PLT 318   ------------------------------------------------------------------------------------------------------------------ Chemistries  Recent Labs  Lab 08/21/19 1231  NA 133*  K 4.4  CL 97*  CO2 27  GLUCOSE 130*  BUN 15  CREATININE 0.81  CALCIUM 8.9   ------------------------------------------------------------------------------------------------------------------  Cardiac Enzymes No results for input(s): TROPONINI in the last 168 hours. ------------------------------------------------------------------------------------------------------------------  RADIOLOGY:  Ct Angio Head W Or Wo Contrast  Result Date: 08/22/2019 CLINICAL DATA:  Follow-up examination for acute stroke. EXAM: CT ANGIOGRAPHY HEAD AND NECK TECHNIQUE: Multidetector CT  imaging of the head and neck was performed  using the standard protocol during bolus administration of intravenous contrast. Multiplanar CT image reconstructions and MIPs were obtained to evaluate the vascular anatomy. Carotid stenosis measurements (when applicable) are obtained utilizing NASCET criteria, using the distal internal carotid diameter as the denominator. CONTRAST:  46mL OMNIPAQUE IOHEXOL 350 MG/ML SOLN COMPARISON:  Prior CT and MRI from 08/21/2019. FINDINGS: CTA NECK FINDINGS Aortic arch: Visualized aortic arch normal in caliber without aneurysm. Mild-to-moderate atherosclerotic change noted within the aortic arch itself. Bovine arch with common origin of the right brachiocephalic and left common carotid artery noted. Scattered atheromatous plaque about the origin of the great vessels without hemodynamically significant stenosis. Visualized subclavian arteries widely patent. Right carotid system: Right CCA tortuous proximally but widely patent to the bifurcation without stenosis. No significant atheromatous narrowing about the right bifurcation. Right ICA medialized into the retropharyngeal space but is widely patent to the skull base without stenosis, dissection, or occlusion. Left carotid system: Left CCA mildly tortuous proximally but widely patent to the bifurcation without stenosis. Mild for age calcified plaque about the left bifurcation without hemodynamically significant stenosis. Left ICA partially medialized into the retropharyngeal space proximally. Eccentric soft plaque at the distal left ICA with short-segment mild stenosis of no more than 25% by NASCET criteria. Left ICA otherwise widely patent to the skull base without additional stenosis, dissection, or occlusion. Vertebral arteries: Both vertebral arteries arise from the subclavian arteries. Vertebral arteries widely patent within the neck without stenosis, dissection or occlusion. Skeleton: No acute osseous finding. No discrete lytic or blastic osseous lesions. Moderate cervical  spondylolysis present at C3-4 through C6-7. Degenerative changes noted about the TMJs bilaterally. Other neck: No other acute soft tissue abnormality within the neck. Subcentimeter partially calcified right thyroid nodule noted, of doubtful significance given size. Upper chest: Gas and fluid containing structure partially visualized at the right posterior mediastinum (series 4, image 1), indeterminate, but could reflect a partially visualized hiatal hernia. Mild scattered hazy ground-glass opacity with interlobular septal thickening within the visualized lung suspected to reflect a degree of pulmonary interstitial congestion/edema. Superimposed scattered atelectatic changes. Remainder the visualized upper chest demonstrates no acute finding. Review of the MIP images confirms the above findings CTA HEAD FINDINGS Anterior circulation: Petrous segments widely patent bilaterally. Scattered atheromatous plaque within the cavernous/supraclinoid ICAs with relatively mild multifocal narrowing. ICA termini well perfused. A1 segments patent bilaterally. Normal anterior communicating artery. Anterior cerebral arteries widely patent to their distal aspects. Left M1 widely patent. There are focal heterogeneous filling defects involving proximal left M2 branches just distal to the bifurcation, with involvement of both the superior and inferior division (series 6, images 238, 235), consistent with subocclusive and/or recanalized thrombus, likely embolic. Left MCA branches are perfused and patent distally. Right M1 widely patent. Normal right MCA bifurcation. Distal right MCA branches well perfused to their distal aspects. Posterior circulation: Focal mild stenosis involving the slightly dominant left V4 segment as it crosses into the cranial vault. Vertebral arteries otherwise widely patent to the vertebrobasilar junction. Posterior inferior cerebellar arteries patent bilaterally. Basilar widely patent to its distal aspect without  stenosis. Superior cerebral arteries patent bilaterally. Fetal type origin of the left PCA. Hypoplastic right P1 segment with robust right posterior communicating artery. Multifocal atheromatous change throughout the PCAs bilaterally, right worse than left. PCAs are perfused to their distal aspects. Venous sinuses: Patent. Anatomic variants: Fetal type origin of the left PCA, with hypoplastic right P1 segment. No intracranial aneurysm. Review  of the MIP images confirms the above findings IMPRESSION: 1. Focal heterogeneous filling defects involving the proximal left M2 branches as above, consistent with subocclusive and/or recanalized thrombus, likely embolic. Left MCA branches are perfused and patent distally. 2. Moderate atherosclerotic change involving the right greater than left PCAs bilaterally. 3. Additional mild for age atherosclerotic change elsewhere about the major arterial vasculature of the head and neck as above. 4. Partially visualized air and fluid containing structure at the right posterior mediastinum, indeterminate, but could reflect a partially visualized hiatal hernia. Correlation with cross-sectional imaging of the chest could be performed for further evaluation as warranted. 5. Hazy ground-glass opacity with scattered interlobular septal thickening within the partially visualized lungs, suggesting a degree of pulmonary interstitial edema. Electronically Signed   By: Jeannine Boga M.D.   On: 08/22/2019 00:51   Ct Head Wo Contrast  Result Date: 08/21/2019 CLINICAL DATA:  Fall yesterday with minor head injury. Patient reports a weird sensation prior to falling. No history of malignancy. EXAM: CT HEAD WITHOUT CONTRAST TECHNIQUE: Contiguous axial images were obtained from the base of the skull through the vertex without intravenous contrast. COMPARISON:  Limited correlation made with orbital CT 08/16/2017. FINDINGS: Brain: There is an ill-defined low-density lesion in the left  temporoparietal lobe, measuring 2.8 x 2.2 cm on image 15/2. This extends to the cortex and is suspicious for a subacute stroke. Neoplasm, inflammation and focal contusion are less likely. There is no evidence of acute intracranial hemorrhage, other mass lesion, extra-axial fluid collection or midline shift. There is no hydrocephalus. The ventricles and subarachnoid spaces are appropriately sized for age. There is no CT evidence of acute cortical infarction. Vascular: Intracranial vascular calcifications. No hyperdense vessel identified. Skull: Negative for fracture or focal lesion. Sinuses/Orbits: The visualized paranasal sinuses and mastoid air cells are clear. No orbital abnormalities are seen. Other: None. IMPRESSION: 1. Suspected nonhemorrhagic, subacute stroke in the left temporoparietal lobe. Neoplasm, inflammation and focal contusion are less likely. Recommend further evaluation with brain MRI without and with contrast. 2. No evidence of acute hemorrhage, midline shift or extra-axial fluid collection. Electronically Signed   By: Richardean Sale M.D.   On: 08/21/2019 15:07   Ct Angio Neck W Or Wo Contrast  Result Date: 08/22/2019 CLINICAL DATA:  Follow-up examination for acute stroke. EXAM: CT ANGIOGRAPHY HEAD AND NECK TECHNIQUE: Multidetector CT imaging of the head and neck was performed using the standard protocol during bolus administration of intravenous contrast. Multiplanar CT image reconstructions and MIPs were obtained to evaluate the vascular anatomy. Carotid stenosis measurements (when applicable) are obtained utilizing NASCET criteria, using the distal internal carotid diameter as the denominator. CONTRAST:  34mL OMNIPAQUE IOHEXOL 350 MG/ML SOLN COMPARISON:  Prior CT and MRI from 08/21/2019. FINDINGS: CTA NECK FINDINGS Aortic arch: Visualized aortic arch normal in caliber without aneurysm. Mild-to-moderate atherosclerotic change noted within the aortic arch itself. Bovine arch with common origin  of the right brachiocephalic and left common carotid artery noted. Scattered atheromatous plaque about the origin of the great vessels without hemodynamically significant stenosis. Visualized subclavian arteries widely patent. Right carotid system: Right CCA tortuous proximally but widely patent to the bifurcation without stenosis. No significant atheromatous narrowing about the right bifurcation. Right ICA medialized into the retropharyngeal space but is widely patent to the skull base without stenosis, dissection, or occlusion. Left carotid system: Left CCA mildly tortuous proximally but widely patent to the bifurcation without stenosis. Mild for age calcified plaque about the left bifurcation without hemodynamically significant  stenosis. Left ICA partially medialized into the retropharyngeal space proximally. Eccentric soft plaque at the distal left ICA with short-segment mild stenosis of no more than 25% by NASCET criteria. Left ICA otherwise widely patent to the skull base without additional stenosis, dissection, or occlusion. Vertebral arteries: Both vertebral arteries arise from the subclavian arteries. Vertebral arteries widely patent within the neck without stenosis, dissection or occlusion. Skeleton: No acute osseous finding. No discrete lytic or blastic osseous lesions. Moderate cervical spondylolysis present at C3-4 through C6-7. Degenerative changes noted about the TMJs bilaterally. Other neck: No other acute soft tissue abnormality within the neck. Subcentimeter partially calcified right thyroid nodule noted, of doubtful significance given size. Upper chest: Gas and fluid containing structure partially visualized at the right posterior mediastinum (series 4, image 1), indeterminate, but could reflect a partially visualized hiatal hernia. Mild scattered hazy ground-glass opacity with interlobular septal thickening within the visualized lung suspected to reflect a degree of pulmonary interstitial  congestion/edema. Superimposed scattered atelectatic changes. Remainder the visualized upper chest demonstrates no acute finding. Review of the MIP images confirms the above findings CTA HEAD FINDINGS Anterior circulation: Petrous segments widely patent bilaterally. Scattered atheromatous plaque within the cavernous/supraclinoid ICAs with relatively mild multifocal narrowing. ICA termini well perfused. A1 segments patent bilaterally. Normal anterior communicating artery. Anterior cerebral arteries widely patent to their distal aspects. Left M1 widely patent. There are focal heterogeneous filling defects involving proximal left M2 branches just distal to the bifurcation, with involvement of both the superior and inferior division (series 6, images 238, 235), consistent with subocclusive and/or recanalized thrombus, likely embolic. Left MCA branches are perfused and patent distally. Right M1 widely patent. Normal right MCA bifurcation. Distal right MCA branches well perfused to their distal aspects. Posterior circulation: Focal mild stenosis involving the slightly dominant left V4 segment as it crosses into the cranial vault. Vertebral arteries otherwise widely patent to the vertebrobasilar junction. Posterior inferior cerebellar arteries patent bilaterally. Basilar widely patent to its distal aspect without stenosis. Superior cerebral arteries patent bilaterally. Fetal type origin of the left PCA. Hypoplastic right P1 segment with robust right posterior communicating artery. Multifocal atheromatous change throughout the PCAs bilaterally, right worse than left. PCAs are perfused to their distal aspects. Venous sinuses: Patent. Anatomic variants: Fetal type origin of the left PCA, with hypoplastic right P1 segment. No intracranial aneurysm. Review of the MIP images confirms the above findings IMPRESSION: 1. Focal heterogeneous filling defects involving the proximal left M2 branches as above, consistent with  subocclusive and/or recanalized thrombus, likely embolic. Left MCA branches are perfused and patent distally. 2. Moderate atherosclerotic change involving the right greater than left PCAs bilaterally. 3. Additional mild for age atherosclerotic change elsewhere about the major arterial vasculature of the head and neck as above. 4. Partially visualized air and fluid containing structure at the right posterior mediastinum, indeterminate, but could reflect a partially visualized hiatal hernia. Correlation with cross-sectional imaging of the chest could be performed for further evaluation as warranted. 5. Hazy ground-glass opacity with scattered interlobular septal thickening within the partially visualized lungs, suggesting a degree of pulmonary interstitial edema. Electronically Signed   By: Jeannine Boga M.D.   On: 08/22/2019 00:51   Mr Brain Wo Contrast  Result Date: 08/21/2019 CLINICAL DATA:  Stroke, follow-up. Additional history provided: Patient had mechanical fall yesterday, denies any head injury or loss of consciousness. Patient reported felt "weird" prior to falling. EXAM: MRI HEAD WITHOUT CONTRAST TECHNIQUE: Multiplanar, multiecho pulse sequences of the brain and surrounding  structures were obtained without intravenous contrast. COMPARISON:  Head CT 08/21/2019 FINDINGS: Brain: There is an acute infarct within the posterior left temporal lobe and left parietal lobe measuring 2.8 x 3.0 cm in transaxial dimensions. There is more subtle restricted diffusion within the left caudate nucleus and left putamen also consistent with acute infarct. Additionally, there is a punctate acute infarct within the left frontoparietal lobe along the margin of the left lateral ventricle (series 5, image 33). There is an adjacent small subtle acute cortical infarct within the left parietal lobe (series 5, image 33). An additional acute punctate cortical infarct is present within the left temporal lobe more anteriorly  (series 5, image 23). Corresponding T2/FLAIR hyperintensity at these sites. No midline shift or extra-axial fluid collection. No evidence of chronic intracranial blood products. No evidence intracranial mass. Mild generalized parenchymal atrophy. Partially empty sella turcica. Vascular: No definite loss of flow voids within the proximal large arterial vessels. Skull and upper cervical spine: Normal marrow signal. Incompletely assessed cervical spondylosis. Sinuses/Orbits: Imaged globes and orbits demonstrate no acute abnormality. Trace ethmoid sinus mucosal thickening. No significant mastoid effusion. IMPRESSION: Multiple acute infarcts involving the left temporal, parietal and frontal lobes in the MCA vascular territory, the largest within the left temporoparietal lobe measuring 2.8 x 3.0 cm in transaxial dimensions. Acute infarcts are also present within the left caudate and putamen. These results were called by telephone at the time of interpretation on 08/21/2019 at 5:12 pm to provider [facility] , who verbally acknowledged these results. No midline shift or evidence of hemorrhagic conversion. 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 Management plans discussed with the patient, family and they are in agreement.  CODE STATUS: Full  code  DVT Prophylaxis: SCDs  TOTAL TIME TAKING CARE OF THIS PATIENT: 37 minutes.   POSSIBLE D/C IN 1 to 2 DAYS, DEPENDING ON CLINICAL CONDITION.  Saundra Shelling M.D on 08/22/2019 at 11:39 AM  Between 7am to 6pm - Pager - [phone]  After 6pm go to [email or web address] - password [email or web address] [facility]  Office  [phone]  CC: Primary care physician; Einar Pheasant, MD  Note: This dictation was prepared with Dragon dictation along with smaller phrase technology. Any transcriptional errors that result from this process are unintentional.

## 2019-08-22 NOTE — Progress Notes (Signed)
SLP Cancellation Note  Patient Details Name: Sandra Reyes MRN: [id number] DOB: 1930/04/28   Cancelled treatment:       Reason Eval/Treat Not Completed: Patient declined, no reason specified(chart reviewed; consulted NSG then pt/Dtr in room).  Met w/ pt and Dtr in room. Both denied any current speech-language deficits --- both indicated that the expressive language deficits experienced on Sunday and Monday morning were "no longer present". Pt was not inclined to have an Evaluation at bedside; she stated she did not "see the need for more testing". Discussed w/ both pt and Dtr that if they felt pt's language skills were at her baseline again, then this SLP would hold on assessment at this time --- pt agreed stating "I have had so many tests, and I just want to go home". To avoid any further frustration to pt, SLP recommended f/u tomorrow and/or f/u w/ PCP post discharge if any further questions/concerns re: language skills/abilities and need for formal assessment w/ ST services. Pt and Dtr agreed though pt stated she "did not see the need for SLP to return tomorrow". NSG updated.      Orinda Kenner, MS, CCC-SLP Tocara Mennen 08/22/2019, 3:05 PM

## 2019-08-22 NOTE — Consult Note (Signed)
Referring Physician: Pyreddy    Chief Complaint: Difficulty with speech  HPI: HELIANA MADRUGA is an 85 y.o. female with history of HTN and HLD who became weak and had a fall at home.  She was on the ground for about 2 hours before being found by her family.  Per daughter she was occasionally "speaking gibberish" and acting confused on the day prior to admission.  She slept on and off for the whole day.  On yesterday called patient's PCP, who recommended that patient come to the ED for further evaluation.  Blood pressure elevated on admission. Initial NIHSS of 3  Date last known well: Date: 08/20/2019 Time last known well: Unable to determine tPA Given: No: Outside time window  Past Medical History:  Diagnosis Date  . Anxiety   . Collagen vascular disease ([place])   . Degenerative disc disease   . Depression   . GERD (gastroesophageal reflux disease)   . Hyperlipidemia   . Hypertension     Past Surgical History:  Procedure Laterality Date  . ABDOMINAL HYSTERECTOMY  1997   uterus and cervix removed, reports she still has her ovaries  . BREAST SURGERY  1960 to 1970   lumps removed  . CYSTOCELE REPAIR    . ESOPHAGOGASTRODUODENOSCOPY (EGD) WITH PROPOFOL N/A 10/03/2016   Procedure: ESOPHAGOGASTRODUODENOSCOPY (EGD) WITH PROPOFOL;  Surgeon: Manya Silvas, MD;  Location: [facility] ENDOSCOPY;  Service: Endoscopy;  Laterality: N/A;  . RECTOCELE REPAIR      Family History  Problem Relation Age of Onset  . Heart disease Mother   . Arthritis Mother   . Ulcers Father        uremic poisoning  . Arthritis Sister        x 4  . Hyperlipidemia Sister        x 1  . Hypertension Sister   . Cancer Sister        x 2 (ovarian cancer) & 1 (colon cancer)  . Diabetes Sister    Social History:  reports that she has never smoked. She has never used smokeless tobacco. She reports that she does not drink alcohol or use drugs.  Allergies:  Allergies  Allergen Reactions  . Celexa [Citalopram]   .  Effexor [Venlafaxine]   . Lipitor [Atorvastatin]   . Macrobid [Nitrofurantoin Macrocrystal]   . Nortriptyline   . Penicillin V Potassium     Has patient had a PCN reaction causing immediate rash, facial/tongue/throat swelling, SOB or lightheadedness with hypotension: Yes Has patient had a PCN reaction causing severe rash involving mucus membranes or skin necrosis: No Has patient had a PCN reaction that required hospitalization No Has patient had a PCN reaction occurring within the last 10 years: No If all of the above answers are "NO", then may proceed with Cephalosporin use.  . Prozac [Fluoxetine]   . Sulfa Antibiotics Rash    Medications:  I have reviewed the patient's current medications. Prior to Admission:  Medications Prior to Admission  Medication Sig Dispense Refill Last Dose  . ALPRAZolam (XANAX) 0.5 MG tablet TAKE 1/2 TABLET BY MOUTH ONCE DAILY AND 1 TABLET AT BEDTIME 45 tablet 1 08/20/2019 at 2045  . aspirin 81 MG tablet Take 81 mg by mouth daily.   08/20/2019 at Unknown time  . famotidine (PEPCID) 20 MG tablet TAKE 1 TABLET BY MOUTH  DAILY (Patient taking differently: Take 20 mg by mouth daily as needed. ) 90 tablet 1 prn at prn  . metoprolol succinate (TOPROL-XL) 25  MG 24 hr tablet TAKE ONE-HALF TABLET BY  MOUTH TWICE DAILY 90 tablet 3 08/21/2019 at 0900  . Multiple Vitamin (MULTIVITAMIN) tablet Take 1 tablet by mouth daily.   08/20/2019 at Unknown time  . vitamin C (ASCORBIC ACID) 250 MG tablet Take 250 mg by mouth daily.   08/20/2019 at Unknown time  . vitamin E 400 UNIT capsule Take 400 Units by mouth daily.   08/20/2019 at Unknown time   Scheduled: . aspirin EC  81 mg Oral Daily  . clopidogrel  75 mg Oral Daily  . enoxaparin (LOVENOX) injection  40 mg Subcutaneous Q24H  . metoprolol succinate  12.5 mg Oral BID  . multivitamin with minerals  1 tablet Oral Daily  . rosuvastatin  20 mg Oral q1800  . vitamin C  250 mg Oral Daily  . vitamin E  400 Units Oral Daily     ROS: History obtained from the patient  General ROS: negative for - chills, fatigue, fever, night sweats, weight gain or weight loss Psychological ROS: negative for - behavioral disorder, hallucinations, memory difficulties, mood swings or suicidal ideation Ophthalmic ROS: negative for - blurry vision, double vision, eye pain or loss of vision ENT ROS: negative for - epistaxis, nasal discharge, oral lesions, sore throat, tinnitus or vertigo Allergy and Immunology ROS: negative for - hives or itchy/watery eyes Hematological and Lymphatic ROS: negative for - bleeding problems, bruising or swollen lymph nodes Endocrine ROS: negative for - galactorrhea, hair pattern changes, polydipsia/polyuria or temperature intolerance Respiratory ROS: negative for - cough, hemoptysis, shortness of breath or wheezing Cardiovascular ROS: negative for - chest pain, dyspnea on exertion, edema or irregular heartbeat Gastrointestinal ROS: negative for - abdominal pain, diarrhea, hematemesis, nausea/vomiting or stool incontinence Genito-Urinary ROS: negative for - dysuria, hematuria, incontinence or urinary frequency/urgency Musculoskeletal ROS: left leg pain Neurological ROS: as noted in HPI Dermatological ROS: negative for rash and skin lesion changes  Physical Examination: Blood pressure (!) 158/60, pulse (!) 58, temperature (!) 97.4 F (36.3 C), temperature source Oral, resp. rate 20, height 5' (1.524 m), weight 81.6 kg, SpO2 98 %.  HEENT-  Normocephalic, no lesions, without obvious abnormality.  Normal external eye and conjunctiva.  Normal TM's bilaterally.  Normal auditory canals and external ears. Normal external nose, mucus membranes and septum.  Normal pharynx. Cardiovascular- S1, S2 normal, pulses palpable throughout   Lungs- chest clear, no wheezing, rales, normal symmetric air entry Abdomen- soft, non-tender; bowel sounds normal; no masses,  no organomegaly Extremities- no edema Lymph-no  adenopathy palpable Musculoskeletal-no joint tenderness, deformity or swelling Skin-warm and dry, no hyperpigmentation, vitiligo, or suspicious lesions  Neurological Examination   Mental Status: Alert, easily confused.  Speech fluent without evidence of aphasia.  Requires extensive reinforcement to follow 3 step commands  Cranial Nerves: II: Discs flat bilaterally; Visual fields grossly normal, pupils equal, round, reactive to light and accommodation III,IV, VI: ptosis not present, extra-ocular motions intact bilaterally V,VII: mild right facial droop, facial light touch sensation normal bilaterally VIII: hearing normal bilaterally IX,X: gag reflex present XI: bilateral shoulder shrug XII: midline tongue extension Motor: Right : Upper extremity   5/5    Left:     Upper extremity   5/5  Lower extremity   5/5     Lower extremity   5-/5 Tone and bulk:normal tone throughout; no atrophy noted Sensory: Pinprick and light touch decreased in the LLE Deep Tendon Reflexes: Symmetric throughout Plantars: Right: mute   Left: mute Cerebellar: Normal finger-to-nose and normal  heel-to-shin testing bilaterally Gait: not tested due to safety concerns   Laboratory Studies:  Basic Metabolic Panel: Recent Labs  Lab 08/21/19 1231  NA 133*  K 4.4  CL 97*  CO2 27  GLUCOSE 130*  BUN 15  CREATININE 0.81  CALCIUM 8.9    Liver Function Tests: No results for input(s): AST, ALT, ALKPHOS, BILITOT, PROT, ALBUMIN in the last 168 hours. No results for input(s): LIPASE, AMYLASE in the last 168 hours. No results for input(s): AMMONIA in the last 168 hours.  CBC: Recent Labs  Lab 08/21/19 1231  WBC 8.3  HGB 14.9  HCT 44.4  MCV 90.4  PLT 318    Cardiac Enzymes: No results for input(s): CKTOTAL, CKMB, CKMBINDEX, TROPONINI in the last 168 hours.  BNP: Invalid input(s): POCBNP  CBG: No results for input(s): GLUCAP in the last 168 hours.  Microbiology: No results found for this or any  previous visit.  Coagulation Studies: No results for input(s): LABPROT, INR in the last 72 hours.  Urinalysis: No results for input(s): COLORURINE, LABSPEC, PHURINE, GLUCOSEU, HGBUR, BILIRUBINUR, KETONESUR, PROTEINUR, UROBILINOGEN, NITRITE, LEUKOCYTESUR in the last 168 hours.  Invalid input(s): APPERANCEUR  Lipid Panel:    Component Value Date/Time   CHOL 209 (H) 08/22/2019 0420   TRIG 106 08/22/2019 0420   HDL 41 08/22/2019 0420   CHOLHDL 5.1 08/22/2019 0420   VLDL 21 08/22/2019 0420   LDLCALC 147 (H) 08/22/2019 0420    HgbA1C:  Lab Results  Component Value Date   HGBA1C 6.2 07/01/2018    Urine Drug Screen:  No results found for: LABOPIA, COCAINSCRNUR, LABBENZ, AMPHETMU, THCU, LABBARB  Alcohol Level: No results for input(s): ETH in the last 168 hours.  Other results: EKG: normal sinus rhythm at 62 bpm.  Imaging: Ct Angio Head W Or Wo Contrast  Result Date: 08/22/2019 CLINICAL DATA:  Follow-up examination for acute stroke. EXAM: CT ANGIOGRAPHY HEAD AND NECK TECHNIQUE: Multidetector CT imaging of the head and neck was performed using the standard protocol during bolus administration of intravenous contrast. Multiplanar CT image reconstructions and MIPs were obtained to evaluate the vascular anatomy. Carotid stenosis measurements (when applicable) are obtained utilizing NASCET criteria, using the distal internal carotid diameter as the denominator. CONTRAST:  75mL OMNIPAQUE IOHEXOL 350 MG/ML SOLN COMPARISON:  Prior CT and MRI from 08/21/2019. FINDINGS: CTA NECK FINDINGS Aortic arch: Visualized aortic arch normal in caliber without aneurysm. Mild-to-moderate atherosclerotic change noted within the aortic arch itself. Bovine arch with common origin of the right brachiocephalic and left common carotid artery noted. Scattered atheromatous plaque about the origin of the great vessels without hemodynamically significant stenosis. Visualized subclavian arteries widely patent. Right carotid  system: Right CCA tortuous proximally but widely patent to the bifurcation without stenosis. No significant atheromatous narrowing about the right bifurcation. Right ICA medialized into the retropharyngeal space but is widely patent to the skull base without stenosis, dissection, or occlusion. Left carotid system: Left CCA mildly tortuous proximally but widely patent to the bifurcation without stenosis. Mild for age calcified plaque about the left bifurcation without hemodynamically significant stenosis. Left ICA partially medialized into the retropharyngeal space proximally. Eccentric soft plaque at the distal left ICA with short-segment mild stenosis of no more than 25% by NASCET criteria. Left ICA otherwise widely patent to the skull base without additional stenosis, dissection, or occlusion. Vertebral arteries: Both vertebral arteries arise from the subclavian arteries. Vertebral arteries widely patent within the neck without stenosis, dissection or occlusion. Skeleton: No acute osseous finding.  No discrete lytic or blastic osseous lesions. Moderate cervical spondylolysis present at C3-4 through C6-7. Degenerative changes noted about the TMJs bilaterally. Other neck: No other acute soft tissue abnormality within the neck. Subcentimeter partially calcified right thyroid nodule noted, of doubtful significance given size. Upper chest: Gas and fluid containing structure partially visualized at the right posterior mediastinum (series 4, image 1), indeterminate, but could reflect a partially visualized hiatal hernia. Mild scattered hazy ground-glass opacity with interlobular septal thickening within the visualized lung suspected to reflect a degree of pulmonary interstitial congestion/edema. Superimposed scattered atelectatic changes. Remainder the visualized upper chest demonstrates no acute finding. Review of the MIP images confirms the above findings CTA HEAD FINDINGS Anterior circulation: Petrous segments widely  patent bilaterally. Scattered atheromatous plaque within the cavernous/supraclinoid ICAs with relatively mild multifocal narrowing. ICA termini well perfused. A1 segments patent bilaterally. Normal anterior communicating artery. Anterior cerebral arteries widely patent to their distal aspects. Left M1 widely patent. There are focal heterogeneous filling defects involving proximal left M2 branches just distal to the bifurcation, with involvement of both the superior and inferior division (series 6, images 238, 235), consistent with subocclusive and/or recanalized thrombus, likely embolic. Left MCA branches are perfused and patent distally. Right M1 widely patent. Normal right MCA bifurcation. Distal right MCA branches well perfused to their distal aspects. Posterior circulation: Focal mild stenosis involving the slightly dominant left V4 segment as it crosses into the cranial vault. Vertebral arteries otherwise widely patent to the vertebrobasilar junction. Posterior inferior cerebellar arteries patent bilaterally. Basilar widely patent to its distal aspect without stenosis. Superior cerebral arteries patent bilaterally. Fetal type origin of the left PCA. Hypoplastic right P1 segment with robust right posterior communicating artery. Multifocal atheromatous change throughout the PCAs bilaterally, right worse than left. PCAs are perfused to their distal aspects. Venous sinuses: Patent. Anatomic variants: Fetal type origin of the left PCA, with hypoplastic right P1 segment. No intracranial aneurysm. Review of the MIP images confirms the above findings IMPRESSION: 1. Focal heterogeneous filling defects involving the proximal left M2 branches as above, consistent with subocclusive and/or recanalized thrombus, likely embolic. Left MCA branches are perfused and patent distally. 2. Moderate atherosclerotic change involving the right greater than left PCAs bilaterally. 3. Additional mild for age atherosclerotic change  elsewhere about the major arterial vasculature of the head and neck as above. 4. Partially visualized air and fluid containing structure at the right posterior mediastinum, indeterminate, but could reflect a partially visualized hiatal hernia. Correlation with cross-sectional imaging of the chest could be performed for further evaluation as warranted. 5. Hazy ground-glass opacity with scattered interlobular septal thickening within the partially visualized lungs, suggesting a degree of pulmonary interstitial edema. Electronically Signed   By: Jeannine Boga M.D.   On: 08/22/2019 00:51   Ct Head Wo Contrast  Result Date: 08/21/2019 CLINICAL DATA:  Fall yesterday with minor head injury. Patient reports a weird sensation prior to falling. No history of malignancy. EXAM: CT HEAD WITHOUT CONTRAST TECHNIQUE: Contiguous axial images were obtained from the base of the skull through the vertex without intravenous contrast. COMPARISON:  Limited correlation made with orbital CT 08/16/2017. FINDINGS: Brain: There is an ill-defined low-density lesion in the left temporoparietal lobe, measuring 2.8 x 2.2 cm on image 15/2. This extends to the cortex and is suspicious for a subacute stroke. Neoplasm, inflammation and focal contusion are less likely. There is no evidence of acute intracranial hemorrhage, other mass lesion, extra-axial fluid collection or midline shift. There is no hydrocephalus.  The ventricles and subarachnoid spaces are appropriately sized for age. There is no CT evidence of acute cortical infarction. Vascular: Intracranial vascular calcifications. No hyperdense vessel identified. Skull: Negative for fracture or focal lesion. Sinuses/Orbits: The visualized paranasal sinuses and mastoid air cells are clear. No orbital abnormalities are seen. Other: None. IMPRESSION: 1. Suspected nonhemorrhagic, subacute stroke in the left temporoparietal lobe. Neoplasm, inflammation and focal contusion are less likely.  Recommend further evaluation with brain MRI without and with contrast. 2. No evidence of acute hemorrhage, midline shift or extra-axial fluid collection. Electronically Signed   By: Richardean Sale M.D.   On: 08/21/2019 15:07   Ct Angio Neck W Or Wo Contrast  Result Date: 08/22/2019 CLINICAL DATA:  Follow-up examination for acute stroke. EXAM: CT ANGIOGRAPHY HEAD AND NECK TECHNIQUE: Multidetector CT imaging of the head and neck was performed using the standard protocol during bolus administration of intravenous contrast. Multiplanar CT image reconstructions and MIPs were obtained to evaluate the vascular anatomy. Carotid stenosis measurements (when applicable) are obtained utilizing NASCET criteria, using the distal internal carotid diameter as the denominator. CONTRAST:  73mL OMNIPAQUE IOHEXOL 350 MG/ML SOLN COMPARISON:  Prior CT and MRI from 08/21/2019. FINDINGS: CTA NECK FINDINGS Aortic arch: Visualized aortic arch normal in caliber without aneurysm. Mild-to-moderate atherosclerotic change noted within the aortic arch itself. Bovine arch with common origin of the right brachiocephalic and left common carotid artery noted. Scattered atheromatous plaque about the origin of the great vessels without hemodynamically significant stenosis. Visualized subclavian arteries widely patent. Right carotid system: Right CCA tortuous proximally but widely patent to the bifurcation without stenosis. No significant atheromatous narrowing about the right bifurcation. Right ICA medialized into the retropharyngeal space but is widely patent to the skull base without stenosis, dissection, or occlusion. Left carotid system: Left CCA mildly tortuous proximally but widely patent to the bifurcation without stenosis. Mild for age calcified plaque about the left bifurcation without hemodynamically significant stenosis. Left ICA partially medialized into the retropharyngeal space proximally. Eccentric soft plaque at the distal left ICA  with short-segment mild stenosis of no more than 25% by NASCET criteria. Left ICA otherwise widely patent to the skull base without additional stenosis, dissection, or occlusion. Vertebral arteries: Both vertebral arteries arise from the subclavian arteries. Vertebral arteries widely patent within the neck without stenosis, dissection or occlusion. Skeleton: No acute osseous finding. No discrete lytic or blastic osseous lesions. Moderate cervical spondylolysis present at C3-4 through C6-7. Degenerative changes noted about the TMJs bilaterally. Other neck: No other acute soft tissue abnormality within the neck. Subcentimeter partially calcified right thyroid nodule noted, of doubtful significance given size. Upper chest: Gas and fluid containing structure partially visualized at the right posterior mediastinum (series 4, image 1), indeterminate, but could reflect a partially visualized hiatal hernia. Mild scattered hazy ground-glass opacity with interlobular septal thickening within the visualized lung suspected to reflect a degree of pulmonary interstitial congestion/edema. Superimposed scattered atelectatic changes. Remainder the visualized upper chest demonstrates no acute finding. Review of the MIP images confirms the above findings CTA HEAD FINDINGS Anterior circulation: Petrous segments widely patent bilaterally. Scattered atheromatous plaque within the cavernous/supraclinoid ICAs with relatively mild multifocal narrowing. ICA termini well perfused. A1 segments patent bilaterally. Normal anterior communicating artery. Anterior cerebral arteries widely patent to their distal aspects. Left M1 widely patent. There are focal heterogeneous filling defects involving proximal left M2 branches just distal to the bifurcation, with involvement of both the superior and inferior division (series 6, images 238, 235), consistent with  subocclusive and/or recanalized thrombus, likely embolic. Left MCA branches are perfused and  patent distally. Right M1 widely patent. Normal right MCA bifurcation. Distal right MCA branches well perfused to their distal aspects. Posterior circulation: Focal mild stenosis involving the slightly dominant left V4 segment as it crosses into the cranial vault. Vertebral arteries otherwise widely patent to the vertebrobasilar junction. Posterior inferior cerebellar arteries patent bilaterally. Basilar widely patent to its distal aspect without stenosis. Superior cerebral arteries patent bilaterally. Fetal type origin of the left PCA. Hypoplastic right P1 segment with robust right posterior communicating artery. Multifocal atheromatous change throughout the PCAs bilaterally, right worse than left. PCAs are perfused to their distal aspects. Venous sinuses: Patent. Anatomic variants: Fetal type origin of the left PCA, with hypoplastic right P1 segment. No intracranial aneurysm. Review of the MIP images confirms the above findings IMPRESSION: 1. Focal heterogeneous filling defects involving the proximal left M2 branches as above, consistent with subocclusive and/or recanalized thrombus, likely embolic. Left MCA branches are perfused and patent distally. 2. Moderate atherosclerotic change involving the right greater than left PCAs bilaterally. 3. Additional mild for age atherosclerotic change elsewhere about the major arterial vasculature of the head and neck as above. 4. Partially visualized air and fluid containing structure at the right posterior mediastinum, indeterminate, but could reflect a partially visualized hiatal hernia. Correlation with cross-sectional imaging of the chest could be performed for further evaluation as warranted. 5. Hazy ground-glass opacity with scattered interlobular septal thickening within the partially visualized lungs, suggesting a degree of pulmonary interstitial edema. Electronically Signed   By: Jeannine Boga M.D.   On: 08/22/2019 00:51   Mr Brain Wo Contrast  Result  Date: 08/21/2019 CLINICAL DATA:  Stroke, follow-up. Additional history provided: Patient had mechanical fall yesterday, denies any head injury or loss of consciousness. Patient reported felt "weird" prior to falling. EXAM: MRI HEAD WITHOUT CONTRAST TECHNIQUE: Multiplanar, multiecho pulse sequences of the brain and surrounding structures were obtained without intravenous contrast. COMPARISON:  Head CT 08/21/2019 FINDINGS: Brain: There is an acute infarct within the posterior left temporal lobe and left parietal lobe measuring 2.8 x 3.0 cm in transaxial dimensions. There is more subtle restricted diffusion within the left caudate nucleus and left putamen also consistent with acute infarct. Additionally, there is a punctate acute infarct within the left frontoparietal lobe along the margin of the left lateral ventricle (series 5, image 33). There is an adjacent small subtle acute cortical infarct within the left parietal lobe (series 5, image 33). An additional acute punctate cortical infarct is present within the left temporal lobe more anteriorly (series 5, image 23). Corresponding T2/FLAIR hyperintensity at these sites. No midline shift or extra-axial fluid collection. No evidence of chronic intracranial blood products. No evidence intracranial mass. Mild generalized parenchymal atrophy. Partially empty sella turcica. Vascular: No definite loss of flow voids within the proximal large arterial vessels. Skull and upper cervical spine: Normal marrow signal. Incompletely assessed cervical spondylosis. Sinuses/Orbits: Imaged globes and orbits demonstrate no acute abnormality. Trace ethmoid sinus mucosal thickening. No significant mastoid effusion. IMPRESSION: Multiple acute infarcts involving the left temporal, parietal and frontal lobes in the MCA vascular territory, the largest within the left temporoparietal lobe measuring 2.8 x 3.0 cm in transaxial dimensions. Acute infarcts are also present within the left caudate  and putamen. These results were called by telephone at the time of interpretation on 08/21/2019 at 5:12 pm to provider [facility] , who verbally acknowledged these results. No midline shift or evidence of  hemorrhagic conversion. Electronically Signed   By: Kellie Simmering   On: 08/21/2019 17:12    Assessment: 85 y.o. female presenting with confusion and difficulty with speech.  Speech deficits have resolved but patient has a right facial droop and some continued confusion.  MRI of the brain reviewed and shows multiple acute infarcts in the left MCA territory, left caudate and putamen.  CTA shows filling defects of the left M2 branches.  Etiology likely embolic.  Echocardiogram is pending.  LDL 147.    Stroke Risk Factors - hyperlipidemia and hypertension  Plan: 1. HgbA1c pending 2. PT consult, OT consult, Speech consult 3. Echocardiogram pending 4. Prophylactic therapy-Patient on ASA prior to admission.  Would start dual antiplatelet therapy with ASA 81mg  and Plavix 75mg  for three weeks with change to Plavix 75mg  alone as monotherapy after that time. 5. NPO until RN stroke swallow screen 6. Telemetry monitoring 7. Frequent neuro checks 8. Statin for lipid management with target LDL<70.   Alexis Goodell, MD Neurology [phone] 08/22/2019, 9:58 AM

## 2019-08-22 NOTE — Evaluation (Signed)
Physical Therapy Evaluation Patient Details Name: Sandra Reyes MRN: [id number] DOB: 1930-10-03 Today's Date: 08/22/2019   History of Present Illness  presented to ER secondary to mechanical fall due to LE weakness, "speaking gibberish" and AMS; admitted for TIA/CVA work up.  MRI significant for L temporal, parietal and frontal lobes, L caudate and putamen infarcts.  Clinical Impression  Upon evaluation, patient alert and oriented to self, location and situation; unaware of date and mildly confused to more detailed conversation at times.  Follows commands, but demonstrates limited insight into deficits and overall safety needs.  Bilat UE/LE strength and ROM grossly symmetrical and WFL; no focal weakness, sensory or coordination deficits appreciated.  Able to complete bed mobility with mod indep; sit/stand, basic transfers and gait (25') with RW, cga.  Demonstrates higher level balance deficits, indicated by decreased functional reach, increased postural sway in all planes; but able to self-correct with use of assist device or with UE support when standing.  Occasional cuing required for walker management and overall safety; responds to instruction, but question full ability to carry-over. Would benefit from skilled PT to address above deficits and promote optimal return to PLOF; Recommend transition to [facility] upon discharge from acute hospitalization.     Follow Up Recommendations Home health PT    Equipment Recommendations       Recommendations for Other Services       Precautions / Restrictions Precautions Precautions: Fall Restrictions Weight Bearing Restrictions: No      Mobility  Bed Mobility Overal bed mobility: Modified Independent                Transfers Overall transfer level: Needs assistance   Transfers: Sit to/from Stand Sit to Stand: Min guard         General transfer comment: tends to pull on up on RW  Ambulation/Gait Ambulation/Gait assistance: Min  guard Gait Distance (Feet): 25 Feet Assistive device: Rolling walker (2 wheeled)       General Gait Details: choppy stepping pattern with inconsistent step height/length, min cuing for walker position/management, but no overt buckling or LOB.  Feels generally "weaker" than baseline, but voices no specific deviations (other than preferring 4WRW)  Stairs            Wheelchair Mobility    Modified Rankin (Stroke Patients Only)       Balance Overall balance assessment: Needs assistance Sitting-balance support: No upper extremity supported;Feet supported Sitting balance-Leahy Scale: Good     Standing balance support: No upper extremity supported Standing balance-Leahy Scale: Poor Standing balance comment: standing functional reach 2-3" without UE support, indicative of increased fall risk                             Pertinent Vitals/Pain Pain Assessment: No/denies pain    Home Living Family/patient expects to be discharged to:: Private residence Living Arrangements: Alone Available Help at Discharge: Family Type of Home: House Home Access: Level entry     Home Layout: One level Home Equipment: None      Prior Function Level of Independence: Independent with assistive device(s)         Comments: Mod indep with 4WRW for ADLs, household  and limited community distances; denies additional fall history outside of this event.     Hand Dominance   Dominant Hand: Right    Extremity/Trunk Assessment   Upper Extremity Assessment Upper Extremity Assessment: Overall WFL for tasks assessed(grossly 4/5 throughout; no  focal weakness, sensory or coordination deficits appreciated)    Lower Extremity Assessment Lower Extremity Assessment: Overall WFL for tasks assessed(grossly 4/5 throughout; no focal weakness, sensory or coordination deficits appreciated)       Communication   Communication: (intermittent (confrontational) word finding difficulty?)   Cognition Arousal/Alertness: Awake/alert Behavior During Therapy: WFL for tasks assessed/performed Overall Cognitive Status: No family/caregiver present to determine baseline cognitive functioning                                 General Comments: oriented to self, location and general situation; unaware of date, limited insight into deficits, needs for assist      General Comments      Exercises Other Exercises Other Exercises: Standing balance at sink for oral care/hygiene, cga/close sup--does require contralateral UE support for movement outside immediate BOS; fair sequencing of activity   Assessment/Plan    PT Assessment Patient needs continued PT services  PT Problem List Decreased strength;Decreased range of motion;Decreased activity tolerance;Decreased balance;Decreased mobility;Decreased knowledge of use of DME;Decreased safety awareness;Decreased knowledge of precautions       PT Treatment Interventions DME instruction;Gait training;Functional mobility training;Therapeutic activities;Therapeutic exercise;Balance training;Neuromuscular re-education;Cognitive remediation;Patient/family education    PT Goals (Current goals can be found in the Care Plan section)  Acute Rehab PT Goals Patient Stated Goal: to return home PT Goal Formulation: With patient Time For Goal Achievement: 09/05/19 Potential to Achieve Goals: Good    Frequency 7X/week   Barriers to discharge        Co-evaluation               AM-PAC PT "6 Clicks" Mobility  Outcome Measure Help needed turning from your back to your side while in a flat bed without using bedrails?: None Help needed moving from lying on your back to sitting on the side of a flat bed without using bedrails?: None Help needed moving to and from a bed to a chair (including a wheelchair)?: A Little Help needed standing up from a chair using your arms (e.g., wheelchair or bedside chair)?: A Little Help needed to  walk in hospital room?: A Little Help needed climbing 3-5 steps with a railing? : A Little 6 Click Score: 20    End of Session Equipment Utilized During Treatment: Gait belt Activity Tolerance: Patient tolerated treatment well Patient left: in bed;with call bell/phone within reach;with bed alarm set Nurse Communication: Mobility status PT Visit Diagnosis: Difficulty in walking, not elsewhere classified (R26.2);Muscle weakness (generalized) (M62.81);Hemiplegia and hemiparesis Hemiplegia - Right/Left: Right Hemiplegia - dominant/non-dominant: Dominant Hemiplegia - caused by: Cerebral infarction    Time: [id number] PT Time Calculation (min) (ACUTE ONLY): 20 min   Charges:   PT Evaluation $PT Eval Moderate Complexity: 1 Mod PT Treatments $Therapeutic Activity: 8-22 mins       Sandra Reyes, PT, DPT, NCS 08/22/19, 10:25 AM [phone]

## 2019-08-23 LAB — HEMOGLOBIN A1C
Hgb A1c MFr Bld: 5.8 % — ABNORMAL HIGH (ref 4.8–5.6)
Mean Plasma Glucose: 120 mg/dL

## 2019-08-23 MED ORDER — ROSUVASTATIN CALCIUM 20 MG PO TABS: 20.0000 mg | ORAL_TABLET | Freq: Every day | ORAL | 0 refills | Status: DC

## 2019-08-23 MED ORDER — CLOPIDOGREL BISULFATE 75 MG PO TABS: 75.0000 mg | ORAL_TABLET | Freq: Every day | ORAL | 0 refills | Status: DC

## 2019-08-23 MED ORDER — ASPIRIN 81 MG PO TABS: 81.0000 mg | ORAL_TABLET | Freq: Every day | ORAL | 0 refills | Status: AC

## 2019-08-23 NOTE — Progress Notes (Signed)
Subjective: No new neurological complaints.    Objective: Current vital signs: BP (!) 148/60   Pulse 60   Temp 97.8 F (36.6 C)   Resp 18   Ht 5' (1.524 m)   Wt 81.6 kg   SpO2 95%   BMI 35.15 kg/m  Vital signs in last 24 hours: Temp:  [97.8 F (36.6 C)-98 F (36.7 C)] 97.8 F (36.6 C) (09/23 0450) Pulse Rate:  [52-65] 60 (09/23 1052) Resp:  [16-18] 18 (09/23 0450) BP: (141-156)/(60-84) 148/60 (09/23 1052) SpO2:  [95 %-99 %] 95 % (09/23 0450)  Intake/Output from previous day: 09/22 0701 - 09/23 0700 In: 240 [P.O.:240] Out: -  Intake/Output this shift: Total I/O In: 240 [P.O.:240] Out: -  Nutritional status:  Diet Order            Diet Heart Room service appropriate? Yes; Fluid consistency: Thin  Diet effective now              Neurologic Exam: Mental Status: Alert, easily confused.  Speech fluent without evidence of aphasia.  Requires extensive reinforcement to follow 3 step commands  Cranial Nerves: II: Discs flat bilaterally; Visual fields grossly normal, pupils equal, round, reactive to light and accommodation III,IV, VI: ptosis not present, extra-ocular motions intact bilaterally V,VII: mild right facial droop, facial light touch sensation normal bilaterally VIII: hearing normal bilaterally IX,X: gag reflex present XI: bilateral shoulder shrug XII: midline tongue extension Motor: Right :  Upper extremity   5/5                                      Left:     Upper extremity   5/5             Lower extremity   5/5                                                  Lower extremity   5-/5 Tone and bulk:normal tone throughout; no atrophy noted Sensory: Pinprick and light touch decreased in the LLE Deep Tendon Reflexes: Symmetric throughout  Lab Results: Basic Metabolic Panel: Recent Labs  Lab 08/21/19 1231  NA 133*  K 4.4  CL 97*  CO2 27  GLUCOSE 130*  BUN 15  CREATININE 0.81  CALCIUM 8.9    Liver Function Tests: No results for input(s): AST,  ALT, ALKPHOS, BILITOT, PROT, ALBUMIN in the last 168 hours. No results for input(s): LIPASE, AMYLASE in the last 168 hours. No results for input(s): AMMONIA in the last 168 hours.  CBC: Recent Labs  Lab 08/21/19 1231  WBC 8.3  HGB 14.9  HCT 44.4  MCV 90.4  PLT 318    Cardiac Enzymes: No results for input(s): CKTOTAL, CKMB, CKMBINDEX, TROPONINI in the last 168 hours.  Lipid Panel: Recent Labs  Lab 08/22/19 0420  CHOL 209*  TRIG 106  HDL 41  CHOLHDL 5.1  VLDL 21  LDLCALC 147*    CBG: No results for input(s): GLUCAP in the last 168 hours.  Microbiology: Results for orders placed or performed during the hospital encounter of 08/21/19  SARS CORONAVIRUS 2 (TAT 6-24 HRS) Nasopharyngeal Nasopharyngeal Swab     Status: None   Collection Time: 08/21/19  5:38 PM   Specimen: Nasopharyngeal Swab  Result Value Ref Range Status   SARS Coronavirus 2 NEGATIVE NEGATIVE Final    Comment: (NOTE) SARS-CoV-2 target nucleic acids are NOT DETECTED. The SARS-CoV-2 RNA is generally detectable in upper and lower respiratory specimens during the acute phase of infection. Negative results do not preclude SARS-CoV-2 infection, do not rule out co-infections with other pathogens, and should not be used as the sole basis for treatment or other patient management decisions. Negative results must be combined with clinical observations, patient history, and epidemiological information. The expected result is Negative. Fact Sheet for Patients: SugarRoll.be Fact Sheet for Healthcare Providers: https://www.woods-mathews.com/ This test is not yet approved or cleared by the [place] FDA and  has been authorized for detection and/or diagnosis of SARS-CoV-2 by FDA under an Emergency Use Authorization (EUA). This EUA will remain  in effect (meaning this test can be used) for the duration of the COVID-19 declaration under Section 56 4(b)(1) of the Act, 21  U.S.C. section 360bbb-3(b)(1), unless the authorization is terminated or revoked sooner. Performed at [facility], [place] [address], [place], [place] [address]     Coagulation Studies: No results for input(s): LABPROT, INR in the last 72 hours.  Imaging: Ct Angio Head W Or Wo Contrast  Result Date: 08/22/2019 CLINICAL DATA:  Follow-up examination for acute stroke. EXAM: CT ANGIOGRAPHY HEAD AND NECK TECHNIQUE: Multidetector CT imaging of the head and neck was performed using the standard protocol during bolus administration of intravenous contrast. Multiplanar CT image reconstructions and MIPs were obtained to evaluate the vascular anatomy. Carotid stenosis measurements (when applicable) are obtained utilizing NASCET criteria, using the distal internal carotid diameter as the denominator. CONTRAST:  84mL OMNIPAQUE IOHEXOL 350 MG/ML SOLN COMPARISON:  Prior CT and MRI from 08/21/2019. FINDINGS: CTA NECK FINDINGS Aortic arch: Visualized aortic arch normal in caliber without aneurysm. Mild-to-moderate atherosclerotic change noted within the aortic arch itself. Bovine arch with common origin of the right brachiocephalic and left common carotid artery noted. Scattered atheromatous plaque about the origin of the great vessels without hemodynamically significant stenosis. Visualized subclavian arteries widely patent. Right carotid system: Right CCA tortuous proximally but widely patent to the bifurcation without stenosis. No significant atheromatous narrowing about the right bifurcation. Right ICA medialized into the retropharyngeal space but is widely patent to the skull base without stenosis, dissection, or occlusion. Left carotid system: Left CCA mildly tortuous proximally but widely patent to the bifurcation without stenosis. Mild for age calcified plaque about the left bifurcation without hemodynamically significant stenosis. Left ICA partially medialized into the retropharyngeal space proximally.  Eccentric soft plaque at the distal left ICA with short-segment mild stenosis of no more than 25% by NASCET criteria. Left ICA otherwise widely patent to the skull base without additional stenosis, dissection, or occlusion. Vertebral arteries: Both vertebral arteries arise from the subclavian arteries. Vertebral arteries widely patent within the neck without stenosis, dissection or occlusion. Skeleton: No acute osseous finding. No discrete lytic or blastic osseous lesions. Moderate cervical spondylolysis present at C3-4 through C6-7. Degenerative changes noted about the TMJs bilaterally. Other neck: No other acute soft tissue abnormality within the neck. Subcentimeter partially calcified right thyroid nodule noted, of doubtful significance given size. Upper chest: Gas and fluid containing structure partially visualized at the right posterior mediastinum (series 4, image 1), indeterminate, but could reflect a partially visualized hiatal hernia. Mild scattered hazy ground-glass opacity with interlobular septal thickening within the visualized lung suspected to reflect a degree of pulmonary interstitial congestion/edema. Superimposed scattered atelectatic changes.  Remainder the visualized upper chest demonstrates no acute finding. Review of the MIP images confirms the above findings CTA HEAD FINDINGS Anterior circulation: Petrous segments widely patent bilaterally. Scattered atheromatous plaque within the cavernous/supraclinoid ICAs with relatively mild multifocal narrowing. ICA termini well perfused. A1 segments patent bilaterally. Normal anterior communicating artery. Anterior cerebral arteries widely patent to their distal aspects. Left M1 widely patent. There are focal heterogeneous filling defects involving proximal left M2 branches just distal to the bifurcation, with involvement of both the superior and inferior division (series 6, images 238, 235), consistent with subocclusive and/or recanalized thrombus,  likely embolic. Left MCA branches are perfused and patent distally. Right M1 widely patent. Normal right MCA bifurcation. Distal right MCA branches well perfused to their distal aspects. Posterior circulation: Focal mild stenosis involving the slightly dominant left V4 segment as it crosses into the cranial vault. Vertebral arteries otherwise widely patent to the vertebrobasilar junction. Posterior inferior cerebellar arteries patent bilaterally. Basilar widely patent to its distal aspect without stenosis. Superior cerebral arteries patent bilaterally. Fetal type origin of the left PCA. Hypoplastic right P1 segment with robust right posterior communicating artery. Multifocal atheromatous change throughout the PCAs bilaterally, right worse than left. PCAs are perfused to their distal aspects. Venous sinuses: Patent. Anatomic variants: Fetal type origin of the left PCA, with hypoplastic right P1 segment. No intracranial aneurysm. Review of the MIP images confirms the above findings IMPRESSION: 1. Focal heterogeneous filling defects involving the proximal left M2 branches as above, consistent with subocclusive and/or recanalized thrombus, likely embolic. Left MCA branches are perfused and patent distally. 2. Moderate atherosclerotic change involving the right greater than left PCAs bilaterally. 3. Additional mild for age atherosclerotic change elsewhere about the major arterial vasculature of the head and neck as above. 4. Partially visualized air and fluid containing structure at the right posterior mediastinum, indeterminate, but could reflect a partially visualized hiatal hernia. Correlation with cross-sectional imaging of the chest could be performed for further evaluation as warranted. 5. Hazy ground-glass opacity with scattered interlobular septal thickening within the partially visualized lungs, suggesting a degree of pulmonary interstitial edema. Electronically Signed   By: Jeannine Boga M.D.   On:  08/22/2019 00:51   Ct Head Wo Contrast  Result Date: 08/21/2019 CLINICAL DATA:  Fall yesterday with minor head injury. Patient reports a weird sensation prior to falling. No history of malignancy. EXAM: CT HEAD WITHOUT CONTRAST TECHNIQUE: Contiguous axial images were obtained from the base of the skull through the vertex without intravenous contrast. COMPARISON:  Limited correlation made with orbital CT 08/16/2017. FINDINGS: Brain: There is an ill-defined low-density lesion in the left temporoparietal lobe, measuring 2.8 x 2.2 cm on image 15/2. This extends to the cortex and is suspicious for a subacute stroke. Neoplasm, inflammation and focal contusion are less likely. There is no evidence of acute intracranial hemorrhage, other mass lesion, extra-axial fluid collection or midline shift. There is no hydrocephalus. The ventricles and subarachnoid spaces are appropriately sized for age. There is no CT evidence of acute cortical infarction. Vascular: Intracranial vascular calcifications. No hyperdense vessel identified. Skull: Negative for fracture or focal lesion. Sinuses/Orbits: The visualized paranasal sinuses and mastoid air cells are clear. No orbital abnormalities are seen. Other: None. IMPRESSION: 1. Suspected nonhemorrhagic, subacute stroke in the left temporoparietal lobe. Neoplasm, inflammation and focal contusion are less likely. Recommend further evaluation with brain MRI without and with contrast. 2. No evidence of acute hemorrhage, midline shift or extra-axial fluid collection. Electronically Signed   By:  Richardean Sale M.D.   On: 08/21/2019 15:07   Ct Angio Neck W Or Wo Contrast  Result Date: 08/22/2019 CLINICAL DATA:  Follow-up examination for acute stroke. EXAM: CT ANGIOGRAPHY HEAD AND NECK TECHNIQUE: Multidetector CT imaging of the head and neck was performed using the standard protocol during bolus administration of intravenous contrast. Multiplanar CT image reconstructions and MIPs were  obtained to evaluate the vascular anatomy. Carotid stenosis measurements (when applicable) are obtained utilizing NASCET criteria, using the distal internal carotid diameter as the denominator. CONTRAST:  9mL OMNIPAQUE IOHEXOL 350 MG/ML SOLN COMPARISON:  Prior CT and MRI from 08/21/2019. FINDINGS: CTA NECK FINDINGS Aortic arch: Visualized aortic arch normal in caliber without aneurysm. Mild-to-moderate atherosclerotic change noted within the aortic arch itself. Bovine arch with common origin of the right brachiocephalic and left common carotid artery noted. Scattered atheromatous plaque about the origin of the great vessels without hemodynamically significant stenosis. Visualized subclavian arteries widely patent. Right carotid system: Right CCA tortuous proximally but widely patent to the bifurcation without stenosis. No significant atheromatous narrowing about the right bifurcation. Right ICA medialized into the retropharyngeal space but is widely patent to the skull base without stenosis, dissection, or occlusion. Left carotid system: Left CCA mildly tortuous proximally but widely patent to the bifurcation without stenosis. Mild for age calcified plaque about the left bifurcation without hemodynamically significant stenosis. Left ICA partially medialized into the retropharyngeal space proximally. Eccentric soft plaque at the distal left ICA with short-segment mild stenosis of no more than 25% by NASCET criteria. Left ICA otherwise widely patent to the skull base without additional stenosis, dissection, or occlusion. Vertebral arteries: Both vertebral arteries arise from the subclavian arteries. Vertebral arteries widely patent within the neck without stenosis, dissection or occlusion. Skeleton: No acute osseous finding. No discrete lytic or blastic osseous lesions. Moderate cervical spondylolysis present at C3-4 through C6-7. Degenerative changes noted about the TMJs bilaterally. Other neck: No other acute soft  tissue abnormality within the neck. Subcentimeter partially calcified right thyroid nodule noted, of doubtful significance given size. Upper chest: Gas and fluid containing structure partially visualized at the right posterior mediastinum (series 4, image 1), indeterminate, but could reflect a partially visualized hiatal hernia. Mild scattered hazy ground-glass opacity with interlobular septal thickening within the visualized lung suspected to reflect a degree of pulmonary interstitial congestion/edema. Superimposed scattered atelectatic changes. Remainder the visualized upper chest demonstrates no acute finding. Review of the MIP images confirms the above findings CTA HEAD FINDINGS Anterior circulation: Petrous segments widely patent bilaterally. Scattered atheromatous plaque within the cavernous/supraclinoid ICAs with relatively mild multifocal narrowing. ICA termini well perfused. A1 segments patent bilaterally. Normal anterior communicating artery. Anterior cerebral arteries widely patent to their distal aspects. Left M1 widely patent. There are focal heterogeneous filling defects involving proximal left M2 branches just distal to the bifurcation, with involvement of both the superior and inferior division (series 6, images 238, 235), consistent with subocclusive and/or recanalized thrombus, likely embolic. Left MCA branches are perfused and patent distally. Right M1 widely patent. Normal right MCA bifurcation. Distal right MCA branches well perfused to their distal aspects. Posterior circulation: Focal mild stenosis involving the slightly dominant left V4 segment as it crosses into the cranial vault. Vertebral arteries otherwise widely patent to the vertebrobasilar junction. Posterior inferior cerebellar arteries patent bilaterally. Basilar widely patent to its distal aspect without stenosis. Superior cerebral arteries patent bilaterally. Fetal type origin of the left PCA. Hypoplastic right P1 segment with  robust right posterior communicating artery.  Multifocal atheromatous change throughout the PCAs bilaterally, right worse than left. PCAs are perfused to their distal aspects. Venous sinuses: Patent. Anatomic variants: Fetal type origin of the left PCA, with hypoplastic right P1 segment. No intracranial aneurysm. Review of the MIP images confirms the above findings IMPRESSION: 1. Focal heterogeneous filling defects involving the proximal left M2 branches as above, consistent with subocclusive and/or recanalized thrombus, likely embolic. Left MCA branches are perfused and patent distally. 2. Moderate atherosclerotic change involving the right greater than left PCAs bilaterally. 3. Additional mild for age atherosclerotic change elsewhere about the major arterial vasculature of the head and neck as above. 4. Partially visualized air and fluid containing structure at the right posterior mediastinum, indeterminate, but could reflect a partially visualized hiatal hernia. Correlation with cross-sectional imaging of the chest could be performed for further evaluation as warranted. 5. Hazy ground-glass opacity with scattered interlobular septal thickening within the partially visualized lungs, suggesting a degree of pulmonary interstitial edema. Electronically Signed   By: Jeannine Boga M.D.   On: 08/22/2019 00:51   Mr Brain Wo Contrast  Result Date: 08/21/2019 CLINICAL DATA:  Stroke, follow-up. Additional history provided: Patient had mechanical fall yesterday, denies any head injury or loss of consciousness. Patient reported felt "weird" prior to falling. EXAM: MRI HEAD WITHOUT CONTRAST TECHNIQUE: Multiplanar, multiecho pulse sequences of the brain and surrounding structures were obtained without intravenous contrast. COMPARISON:  Head CT 08/21/2019 FINDINGS: Brain: There is an acute infarct within the posterior left temporal lobe and left parietal lobe measuring 2.8 x 3.0 cm in transaxial dimensions. There is  more subtle restricted diffusion within the left caudate nucleus and left putamen also consistent with acute infarct. Additionally, there is a punctate acute infarct within the left frontoparietal lobe along the margin of the left lateral ventricle (series 5, image 33). There is an adjacent small subtle acute cortical infarct within the left parietal lobe (series 5, image 33). An additional acute punctate cortical infarct is present within the left temporal lobe more anteriorly (series 5, image 23). Corresponding T2/FLAIR hyperintensity at these sites. No midline shift or extra-axial fluid collection. No evidence of chronic intracranial blood products. No evidence intracranial mass. Mild generalized parenchymal atrophy. Partially empty sella turcica. Vascular: No definite loss of flow voids within the proximal large arterial vessels. Skull and upper cervical spine: Normal marrow signal. Incompletely assessed cervical spondylosis. Sinuses/Orbits: Imaged globes and orbits demonstrate no acute abnormality. Trace ethmoid sinus mucosal thickening. No significant mastoid effusion. IMPRESSION: Multiple acute infarcts involving the left temporal, parietal and frontal lobes in the MCA vascular territory, the largest within the left temporoparietal lobe measuring 2.8 x 3.0 cm in transaxial dimensions. Acute infarcts are also present within the left caudate and putamen. These results were called by telephone at the time of interpretation on 08/21/2019 at 5:12 pm to provider [facility] , who verbally acknowledged these results. No midline shift or evidence of hemorrhagic conversion. Electronically Signed   By: Kellie Simmering   On: 08/21/2019 17:12    Medications:  I have reviewed the patient's current medications. Scheduled: . aspirin EC  81 mg Oral Daily  . clopidogrel  75 mg Oral Daily  . enoxaparin (LOVENOX) injection  40 mg Subcutaneous Q24H  . metoprolol succinate  12.5 mg Oral BID  . multivitamin with  minerals  1 tablet Oral Daily  . rosuvastatin  20 mg Oral q1800  . vitamin C  250 mg Oral Daily  . vitamin E  400 Units Oral  Daily    Assessment/Plan: 85 y.o. female presenting with confusion and difficulty with speech.  Speech deficits have resolved but patient has a right facial droop and some continued confusion.  MRI of the brain reviewed and shows multiple acute infarcts in the left MCA territory, left caudate and putamen.  CTA shows filling defects of the left M2 branches.  Etiology likely embolic.  Echocardiogram shows EF of 60-65%.  A1c 5.8.  Recommendations: 1. Prophylactic therapy-Patient on ASA prior to admission.  Would start dual antiplatelet therapy with ASA 81mg  and Plavix 75mg  for three weeks with change to Plavix 75mg  alone as monotherapy after that time. 2. Statin for lipid management with target LDL<70. 3. Patient to follow up with cardiology as an outpatient.  Echo with suboptimal views.  To determine need for TEE and patient to be evaluated for prolonged cardiac monitoring.    LOS: 2 days   Alexis Goodell, MD Neurology [phone] 08/23/2019  11:07 AM

## 2019-08-23 NOTE — Discharge Summary (Signed)
[facility] [place] NAME: Sandra Reyes    MR#:  [id number]  DATE OF BIRTH:  1930-10-14  DATE OF ADMISSION:  08/21/2019 ADMITTING PHYSICIAN: Sela Hua, MD  DATE OF DISCHARGE: 08/23/2019  PRIMARY CARE PHYSICIAN: Einar Pheasant, MD   ADMISSION DIAGNOSIS:  Cerebrovascular accident (CVA), unspecified mechanism ([place]) [I63.9]  DISCHARGE DIAGNOSIS:  Active Problems:   Acute CVA (cerebrovascular accident) ([place]) Acute embolic CVA in the left MCA territory left caudate and putamen Hyperlipidemia Hyper tension  SECONDARY DIAGNOSIS:   Past Medical History:  Diagnosis Date  . Anxiety   . Collagen vascular disease ([place])   . Degenerative disc disease   . Depression   . GERD (gastroesophageal reflux disease)   . Hyperlipidemia   . Hypertension      ADMITTING HISTORY Sandra Reyes  is a 85 y.o. female with a known history of collagen vascular disease, hypertension, hyperlipidemia, depression, anxiety, GERD who presented to the ED after falling at home yesterday.  Patient had a mechanical fall due to "her legs feeling weak".  She was on the ground for about 2 hours before being found by her family.  Per daughter at bedside, patient was occasionally "speaking gibberish" and acting confused yesterday.  She slept on and off for the whole day.  This morning, daughter called patient's PCP, who recommended that patient come to the ED for further evaluation.  Patient denies any current weakness of her arms or legs, other than some generalized weakness.  She denies any numbness or tingling.  She denies any vision changes or speech changes.  She denies any chest pain, shortness of breath, palpitations.  Of note, patient states that her blood pressures have been as high as the [id number] systolic at home over the last couple of days. In the ED, she was hypertensive with BP 175/73.  Labs were significant for sodium 133.  CT head showed a suspected nonhemorrhagic,  subacute stroke in the left temporoparietal lobe.  MRI brain showed multiple infarcts involving the left temporal, parietal, and frontal lobes in the MCA vascular territory.  Hospitalists were called for admission.  HOSPITAL COURSE:  Patient was admitted to medical floor for stroke.  Patient had multiple acute infarcts in the left MCA territory embolic in etiology.  She was started on aspirin and Plavix as per neurology recommendation.  Patient received physical therapy speech therapy during hospitalization.  She was worked up with CTA head and neck, MRI brain, echocardiogram.  Patient received statin medication.  Echocardiogram showed EF of 60 to 65% with normal left ventricular systolic function, no evidence of thrombus noted. Patient will be discharged home with home health services on aspirin and Plavix for 3 weeks and will continue Plavix and follow-up with neurology in the clinic.  Outpatient cardiology evaluation for TEE as per neurology attending.  Patient follows up with Dr. Ubaldo Glassing in the outpatient clinic.  CONSULTS OBTAINED:  Treatment Team:  Catarina Hartshorn, MD  DRUG ALLERGIES:   Allergies  Allergen Reactions  . Celexa [Citalopram]   . Effexor [Venlafaxine]   . Lipitor [Atorvastatin]   . Macrobid [Nitrofurantoin Macrocrystal]   . Nortriptyline   . Penicillin V Potassium     Has patient had a PCN reaction causing immediate rash, facial/tongue/throat swelling, SOB or lightheadedness with hypotension: Yes Has patient had a PCN reaction causing severe rash involving mucus membranes or skin necrosis: No Has patient had a PCN reaction that required hospitalization No Has patient had a PCN  reaction occurring within the last 10 years: No If all of the above answers are "NO", then may proceed with Cephalosporin use.  . Prozac [Fluoxetine]   . Sulfa Antibiotics Rash    DISCHARGE MEDICATIONS:   Allergies as of 08/23/2019      Reactions   Celexa [citalopram]    Effexor [venlafaxine]     Lipitor [atorvastatin]    Macrobid [nitrofurantoin Macrocrystal]    Nortriptyline    Penicillin V Potassium    Has patient had a PCN reaction causing immediate rash, facial/tongue/throat swelling, SOB or lightheadedness with hypotension: Yes Has patient had a PCN reaction causing severe rash involving mucus membranes or skin necrosis: No Has patient had a PCN reaction that required hospitalization No Has patient had a PCN reaction occurring within the last 10 years: No If all of the above answers are "NO", then may proceed with Cephalosporin use.   Prozac [fluoxetine]    Sulfa Antibiotics Rash      Medication List    TAKE these medications   ALPRAZolam 0.5 MG tablet Commonly known as: XANAX TAKE 1/2 TABLET BY MOUTH ONCE DAILY AND 1 TABLET AT BEDTIME   aspirin 81 MG tablet Take 1 tablet (81 mg total) by mouth daily for 21 days.   clopidogrel 75 MG tablet Commonly known as: PLAVIX Take 1 tablet (75 mg total) by mouth daily.   famotidine 20 MG tablet Commonly known as: PEPCID TAKE 1 TABLET BY MOUTH  DAILY What changed:  when to take this reasons to take this   metoprolol succinate 25 MG 24 hr tablet Commonly known as: TOPROL-XL TAKE ONE-HALF TABLET BY  MOUTH TWICE DAILY   multivitamin tablet Take 1 tablet by mouth daily.   rosuvastatin 20 MG tablet Commonly known as: CRESTOR Take 1 tablet (20 mg total) by mouth daily at 6 PM.   vitamin C 250 MG tablet Commonly known as: ASCORBIC ACID Take 250 mg by mouth daily.   vitamin E 400 UNIT capsule Take 400 Units by mouth daily.       Today  Patient seen today No speech difficulty No tingling numbness Tolerating diet well Hemodynamically stable VITAL SIGNS:  Blood pressure (!) 148/60, pulse 60, temperature 97.8 F (36.6 C), resp. rate 18, height 5' (1.524 m), weight 81.6 kg, SpO2 95 %.  I/O:    Intake/Output Summary (Last 24 hours) at 08/23/2019 1237 Last data filed at 08/23/2019 1024 Gross per 24 hour   Intake 480 ml  Output -  Net 480 ml    PHYSICAL EXAMINATION:  Physical Exam  GENERAL:  85 y.o.-year-old patient lying in the bed with no acute distress.  LUNGS: Normal breath sounds bilaterally, no wheezing, rales,rhonchi or crepitation. No use of accessory muscles of respiration.  CARDIOVASCULAR: S1, S2 normal. No murmurs, rubs, or gallops.  ABDOMEN: Soft, non-tender, non-distended. Bowel sounds present. No organomegaly or mass.  NEUROLOGIC: Moves all 4 extremities. PSYCHIATRIC: The patient is alert and oriented x 3.  SKIN: No obvious rash, lesion, or ulcer.   DATA REVIEW:   CBC Recent Labs  Lab 08/21/19 1231  WBC 8.3  HGB 14.9  HCT 44.4  PLT 318    Chemistries  Recent Labs  Lab 08/21/19 1231  NA 133*  K 4.4  CL 97*  CO2 27  GLUCOSE 130*  BUN 15  CREATININE 0.81  CALCIUM 8.9    Cardiac Enzymes No results for input(s): TROPONINI in the last 168 hours.  Microbiology Results  Results for orders placed or  performed during the hospital encounter of 08/21/19  SARS CORONAVIRUS 2 (TAT 6-24 HRS) Nasopharyngeal Nasopharyngeal Swab     Status: None   Collection Time: 08/21/19  5:38 PM   Specimen: Nasopharyngeal Swab  Result Value Ref Range Status   SARS Coronavirus 2 NEGATIVE NEGATIVE Final    Comment: (NOTE) SARS-CoV-2 target nucleic acids are NOT DETECTED. The SARS-CoV-2 RNA is generally detectable in upper and lower respiratory specimens during the acute phase of infection. Negative results do not preclude SARS-CoV-2 infection, do not rule out co-infections with other pathogens, and should not be used as the sole basis for treatment or other patient management decisions. Negative results must be combined with clinical observations, patient history, and epidemiological information. The expected result is Negative. Fact Sheet for Patients: SugarRoll.be Fact Sheet for Healthcare  Providers: https://www.woods-mathews.com/ This test is not yet approved or cleared by the [place] FDA and  has been authorized for detection and/or diagnosis of SARS-CoV-2 by FDA under an Emergency Use Authorization (EUA). This EUA will remain  in effect (meaning this test can be used) for the duration of the COVID-19 declaration under Section 56 4(b)(1) of the Act, 21 U.S.C. section 360bbb-3(b)(1), unless the authorization is terminated or revoked sooner. Performed at [facility], [place] [address] [place], [place] [address]     RADIOLOGY:  Ct Angio Head W Or Wo Contrast  Result Date: 08/22/2019 CLINICAL DATA:  Follow-up examination for acute stroke. EXAM: CT ANGIOGRAPHY HEAD AND NECK TECHNIQUE: Multidetector CT imaging of the head and neck was performed using the standard protocol during bolus administration of intravenous contrast. Multiplanar CT image reconstructions and MIPs were obtained to evaluate the vascular anatomy. Carotid stenosis measurements (when applicable) are obtained utilizing NASCET criteria, using the distal internal carotid diameter as the denominator. CONTRAST:  19mL OMNIPAQUE IOHEXOL 350 MG/ML SOLN COMPARISON:  Prior CT and MRI from 08/21/2019. FINDINGS: CTA NECK FINDINGS Aortic arch: Visualized aortic arch normal in caliber without aneurysm. Mild-to-moderate atherosclerotic change noted within the aortic arch itself. Bovine arch with common origin of the right brachiocephalic and left common carotid artery noted. Scattered atheromatous plaque about the origin of the great vessels without hemodynamically significant stenosis. Visualized subclavian arteries widely patent. Right carotid system: Right CCA tortuous proximally but widely patent to the bifurcation without stenosis. No significant atheromatous narrowing about the right bifurcation. Right ICA medialized into the retropharyngeal space but is widely patent to the skull base without stenosis,  dissection, or occlusion. Left carotid system: Left CCA mildly tortuous proximally but widely patent to the bifurcation without stenosis. Mild for age calcified plaque about the left bifurcation without hemodynamically significant stenosis. Left ICA partially medialized into the retropharyngeal space proximally. Eccentric soft plaque at the distal left ICA with short-segment mild stenosis of no more than 25% by NASCET criteria. Left ICA otherwise widely patent to the skull base without additional stenosis, dissection, or occlusion. Vertebral arteries: Both vertebral arteries arise from the subclavian arteries. Vertebral arteries widely patent within the neck without stenosis, dissection or occlusion. Skeleton: No acute osseous finding. No discrete lytic or blastic osseous lesions. Moderate cervical spondylolysis present at C3-4 through C6-7. Degenerative changes noted about the TMJs bilaterally. Other neck: No other acute soft tissue abnormality within the neck. Subcentimeter partially calcified right thyroid nodule noted, of doubtful significance given size. Upper chest: Gas and fluid containing structure partially visualized at the right posterior mediastinum (series 4, image 1), indeterminate, but could reflect a partially visualized hiatal hernia. Mild scattered  hazy ground-glass opacity with interlobular septal thickening within the visualized lung suspected to reflect a degree of pulmonary interstitial congestion/edema. Superimposed scattered atelectatic changes. Remainder the visualized upper chest demonstrates no acute finding. Review of the MIP images confirms the above findings CTA HEAD FINDINGS Anterior circulation: Petrous segments widely patent bilaterally. Scattered atheromatous plaque within the cavernous/supraclinoid ICAs with relatively mild multifocal narrowing. ICA termini well perfused. A1 segments patent bilaterally. Normal anterior communicating artery. Anterior cerebral arteries widely patent  to their distal aspects. Left M1 widely patent. There are focal heterogeneous filling defects involving proximal left M2 branches just distal to the bifurcation, with involvement of both the superior and inferior division (series 6, images 238, 235), consistent with subocclusive and/or recanalized thrombus, likely embolic. Left MCA branches are perfused and patent distally. Right M1 widely patent. Normal right MCA bifurcation. Distal right MCA branches well perfused to their distal aspects. Posterior circulation: Focal mild stenosis involving the slightly dominant left V4 segment as it crosses into the cranial vault. Vertebral arteries otherwise widely patent to the vertebrobasilar junction. Posterior inferior cerebellar arteries patent bilaterally. Basilar widely patent to its distal aspect without stenosis. Superior cerebral arteries patent bilaterally. Fetal type origin of the left PCA. Hypoplastic right P1 segment with robust right posterior communicating artery. Multifocal atheromatous change throughout the PCAs bilaterally, right worse than left. PCAs are perfused to their distal aspects. Venous sinuses: Patent. Anatomic variants: Fetal type origin of the left PCA, with hypoplastic right P1 segment. No intracranial aneurysm. Review of the MIP images confirms the above findings IMPRESSION: 1. Focal heterogeneous filling defects involving the proximal left M2 branches as above, consistent with subocclusive and/or recanalized thrombus, likely embolic. Left MCA branches are perfused and patent distally. 2. Moderate atherosclerotic change involving the right greater than left PCAs bilaterally. 3. Additional mild for age atherosclerotic change elsewhere about the major arterial vasculature of the head and neck as above. 4. Partially visualized air and fluid containing structure at the right posterior mediastinum, indeterminate, but could reflect a partially visualized hiatal hernia. Correlation with cross-sectional  imaging of the chest could be performed for further evaluation as warranted. 5. Hazy ground-glass opacity with scattered interlobular septal thickening within the partially visualized lungs, suggesting a degree of pulmonary interstitial edema. Electronically Signed   By: Jeannine Boga M.D.   On: 08/22/2019 00:51   Ct Head Wo Contrast  Result Date: 08/21/2019 CLINICAL DATA:  Fall yesterday with minor head injury. Patient reports a weird sensation prior to falling. No history of malignancy. EXAM: CT HEAD WITHOUT CONTRAST TECHNIQUE: Contiguous axial images were obtained from the base of the skull through the vertex without intravenous contrast. COMPARISON:  Limited correlation made with orbital CT 08/16/2017. FINDINGS: Brain: There is an ill-defined low-density lesion in the left temporoparietal lobe, measuring 2.8 x 2.2 cm on image 15/2. This extends to the cortex and is suspicious for a subacute stroke. Neoplasm, inflammation and focal contusion are less likely. There is no evidence of acute intracranial hemorrhage, other mass lesion, extra-axial fluid collection or midline shift. There is no hydrocephalus. The ventricles and subarachnoid spaces are appropriately sized for age. There is no CT evidence of acute cortical infarction. Vascular: Intracranial vascular calcifications. No hyperdense vessel identified. Skull: Negative for fracture or focal lesion. Sinuses/Orbits: The visualized paranasal sinuses and mastoid air cells are clear. No orbital abnormalities are seen. Other: None. IMPRESSION: 1. Suspected nonhemorrhagic, subacute stroke in the left temporoparietal lobe. Neoplasm, inflammation and focal contusion are less likely. Recommend further evaluation  with brain MRI without and with contrast. 2. No evidence of acute hemorrhage, midline shift or extra-axial fluid collection. Electronically Signed   By: Richardean Sale M.D.   On: 08/21/2019 15:07   Ct Angio Neck W Or Wo Contrast  Result Date:  08/22/2019 CLINICAL DATA:  Follow-up examination for acute stroke. EXAM: CT ANGIOGRAPHY HEAD AND NECK TECHNIQUE: Multidetector CT imaging of the head and neck was performed using the standard protocol during bolus administration of intravenous contrast. Multiplanar CT image reconstructions and MIPs were obtained to evaluate the vascular anatomy. Carotid stenosis measurements (when applicable) are obtained utilizing NASCET criteria, using the distal internal carotid diameter as the denominator. CONTRAST:  34mL OMNIPAQUE IOHEXOL 350 MG/ML SOLN COMPARISON:  Prior CT and MRI from 08/21/2019. FINDINGS: CTA NECK FINDINGS Aortic arch: Visualized aortic arch normal in caliber without aneurysm. Mild-to-moderate atherosclerotic change noted within the aortic arch itself. Bovine arch with common origin of the right brachiocephalic and left common carotid artery noted. Scattered atheromatous plaque about the origin of the great vessels without hemodynamically significant stenosis. Visualized subclavian arteries widely patent. Right carotid system: Right CCA tortuous proximally but widely patent to the bifurcation without stenosis. No significant atheromatous narrowing about the right bifurcation. Right ICA medialized into the retropharyngeal space but is widely patent to the skull base without stenosis, dissection, or occlusion. Left carotid system: Left CCA mildly tortuous proximally but widely patent to the bifurcation without stenosis. Mild for age calcified plaque about the left bifurcation without hemodynamically significant stenosis. Left ICA partially medialized into the retropharyngeal space proximally. Eccentric soft plaque at the distal left ICA with short-segment mild stenosis of no more than 25% by NASCET criteria. Left ICA otherwise widely patent to the skull base without additional stenosis, dissection, or occlusion. Vertebral arteries: Both vertebral arteries arise from the subclavian arteries. Vertebral arteries  widely patent within the neck without stenosis, dissection or occlusion. Skeleton: No acute osseous finding. No discrete lytic or blastic osseous lesions. Moderate cervical spondylolysis present at C3-4 through C6-7. Degenerative changes noted about the TMJs bilaterally. Other neck: No other acute soft tissue abnormality within the neck. Subcentimeter partially calcified right thyroid nodule noted, of doubtful significance given size. Upper chest: Gas and fluid containing structure partially visualized at the right posterior mediastinum (series 4, image 1), indeterminate, but could reflect a partially visualized hiatal hernia. Mild scattered hazy ground-glass opacity with interlobular septal thickening within the visualized lung suspected to reflect a degree of pulmonary interstitial congestion/edema. Superimposed scattered atelectatic changes. Remainder the visualized upper chest demonstrates no acute finding. Review of the MIP images confirms the above findings CTA HEAD FINDINGS Anterior circulation: Petrous segments widely patent bilaterally. Scattered atheromatous plaque within the cavernous/supraclinoid ICAs with relatively mild multifocal narrowing. ICA termini well perfused. A1 segments patent bilaterally. Normal anterior communicating artery. Anterior cerebral arteries widely patent to their distal aspects. Left M1 widely patent. There are focal heterogeneous filling defects involving proximal left M2 branches just distal to the bifurcation, with involvement of both the superior and inferior division (series 6, images 238, 235), consistent with subocclusive and/or recanalized thrombus, likely embolic. Left MCA branches are perfused and patent distally. Right M1 widely patent. Normal right MCA bifurcation. Distal right MCA branches well perfused to their distal aspects. Posterior circulation: Focal mild stenosis involving the slightly dominant left V4 segment as it crosses into the cranial vault. Vertebral  arteries otherwise widely patent to the vertebrobasilar junction. Posterior inferior cerebellar arteries patent bilaterally. Basilar widely patent to its distal aspect  without stenosis. Superior cerebral arteries patent bilaterally. Fetal type origin of the left PCA. Hypoplastic right P1 segment with robust right posterior communicating artery. Multifocal atheromatous change throughout the PCAs bilaterally, right worse than left. PCAs are perfused to their distal aspects. Venous sinuses: Patent. Anatomic variants: Fetal type origin of the left PCA, with hypoplastic right P1 segment. No intracranial aneurysm. Review of the MIP images confirms the above findings IMPRESSION: 1. Focal heterogeneous filling defects involving the proximal left M2 branches as above, consistent with subocclusive and/or recanalized thrombus, likely embolic. Left MCA branches are perfused and patent distally. 2. Moderate atherosclerotic change involving the right greater than left PCAs bilaterally. 3. Additional mild for age atherosclerotic change elsewhere about the major arterial vasculature of the head and neck as above. 4. Partially visualized air and fluid containing structure at the right posterior mediastinum, indeterminate, but could reflect a partially visualized hiatal hernia. Correlation with cross-sectional imaging of the chest could be performed for further evaluation as warranted. 5. Hazy ground-glass opacity with scattered interlobular septal thickening within the partially visualized lungs, suggesting a degree of pulmonary interstitial edema. Electronically Signed   By: Jeannine Boga M.D.   On: 08/22/2019 00:51   Mr Brain Wo Contrast  Result Date: 08/21/2019 CLINICAL DATA:  Stroke, follow-up. Additional history provided: Patient had mechanical fall yesterday, denies any head injury or loss of consciousness. Patient reported felt "weird" prior to falling. EXAM: MRI HEAD WITHOUT CONTRAST TECHNIQUE: Multiplanar,  multiecho pulse sequences of the brain and surrounding structures were obtained without intravenous contrast. COMPARISON:  Head CT 08/21/2019 FINDINGS: Brain: There is an acute infarct within the posterior left temporal lobe and left parietal lobe measuring 2.8 x 3.0 cm in transaxial dimensions. There is more subtle restricted diffusion within the left caudate nucleus and left putamen also consistent with acute infarct. Additionally, there is a punctate acute infarct within the left frontoparietal lobe along the margin of the left lateral ventricle (series 5, image 33). There is an adjacent small subtle acute cortical infarct within the left parietal lobe (series 5, image 33). An additional acute punctate cortical infarct is present within the left temporal lobe more anteriorly (series 5, image 23). Corresponding T2/FLAIR hyperintensity at these sites. No midline shift or extra-axial fluid collection. No evidence of chronic intracranial blood products. No evidence intracranial mass. Mild generalized parenchymal atrophy. Partially empty sella turcica. Vascular: No definite loss of flow voids within the proximal large arterial vessels. Skull and upper cervical spine: Normal marrow signal. Incompletely assessed cervical spondylosis. Sinuses/Orbits: Imaged globes and orbits demonstrate no acute abnormality. Trace ethmoid sinus mucosal thickening. No significant mastoid effusion. IMPRESSION: Multiple acute infarcts involving the left temporal, parietal and frontal lobes in the MCA vascular territory, the largest within the left temporoparietal lobe measuring 2.8 x 3.0 cm in transaxial dimensions. Acute infarcts are also present within the left caudate and putamen. These results were called by telephone at the time of interpretation on 08/21/2019 at 5:12 pm to provider [facility] , who verbally acknowledged these results. No midline shift or evidence of hemorrhagic conversion. Electronically Signed   By: Kellie Simmering   On: 08/21/2019 17:12    Follow up with PCP in 1 week.  Management plans discussed with the patient, family and they are in agreement.  CODE STATUS: Full code    Code Status Orders  (From admission, onward)         Start     Ordered   08/21/19 2028  Do not  attempt resuscitation (DNR)  Continuous    Question Answer Comment  In the event of cardiac or respiratory ARREST Do not call a "code blue"   In the event of cardiac or respiratory ARREST Do not perform Intubation, CPR, defibrillation or ACLS   In the event of cardiac or respiratory ARREST Use medication by any route, position, wound care, and other measures to relive pain and suffering. May use oxygen, suction and manual treatment of airway obstruction as needed for comfort.      08/21/19 2027        Code Status History    Date Active Date Inactive Code Status Order ID Comments User Context   10/02/2016 [id number] 10/04/2016 2312 DNR [id number]  Harrie Foreman, MD ED   10/02/2016 0419 10/02/2016 0433 Full Code [id number]  Harrie Foreman, MD ED   Advance Care Planning Activity      TOTAL TIME TAKING CARE OF THIS PATIENT ON DAY OF DISCHARGE: more than 35 minutes.   Saundra Shelling M.D on 08/23/2019 at 12:37 PM  Between 7am to 6pm - Pager - [phone]  After 6pm go to [email or web address] - password [email or web address] [facility]  Office  [phone]  CC: Primary care physician; Einar Pheasant, MD  Note: This dictation was prepared with Dragon dictation along with smaller phrase technology. Any transcriptional errors that result from this process are unintentional.

## 2019-08-23 NOTE — Plan of Care (Signed)
Pt d/ced home with home health.  Will review new medications, f/u appts and instructions with daughter and pt.  Spoke to daughter candidly about some changes witnessed in pt: not following instructions with grips - she pulled me towards her; confusion about which utensil to use and what the date was; some questionable judgement - she uses walker at home but she got out of bed here and wasn't using anything.  Daughter said she will pay attention to any changes.  Pt's daughter will take her home.

## 2019-08-23 NOTE — TOC Transition Note (Signed)
Transition of Care [facility]) - CM/SW Discharge Note   Patient Details  Name: Sandra Reyes MRN: [id number] Date of Birth: March 29, 1930  Transition of Care [facility]) CM/SW Contact:  Candie Chroman, LCSW Phone Number: 08/23/2019, 10:31 AM   Clinical Narrative: Patient has orders to discharge home today. No further concerns. CSW signing off.    Final next level of care: Home/Self Care Barriers to Discharge: Barriers Resolved   Patient Goals and CMS Choice   CMS Medicare.gov Compare Post Acute Care list provided to:: Patient(Daughter at bedside.)    Discharge Placement                    Patient and family notified of of transfer: 08/23/19  Discharge Plan and Services     Post Acute Care Choice: NA          DME Arranged: N/A         HH Arranged: NA          Social Determinants of Health (SDOH) Interventions     Readmission Risk Interventions No flowsheet data found.

## 2019-08-24 ENCOUNTER — Telehealth: Payer: Self-pay

## 2019-08-24 NOTE — Telephone Encounter (Signed)
Transition Care Management Follow-up Telephone Call   Date discharged? 08/23/19   How have you been since you were released from the hospital? Patient states she is doing fine, overall better but still a little weak. Walking often around the home to keep her strength up. Denies chest pain, headache, dizziness and all other symptoms. She is monitoring her blood pressure and notes it was elevated to 170/79 this morning but is now 148/79.    Do you understand why you were in the hospital? Yes, stroke.    Do you understand the discharge instructions? Yes,increase activity as tolerated. Follow up with pcp, neurology and cardiology   Where were you discharged to? Home   Items Reviewed:  Medications reviewed: Yes, taking as directed. Daughters are there to assist as needed.   Allergies reviewed: Yes  Dietary changes reviewed: Yes, low salt diet and no processed foods.  Referrals reviewed: Yes, appointment scheduled with neurology and cardiology.    Functional Questionnaire:   Activities of Daily Living (ADLs):   She states they are independent in the following: Independent in all ADLS.  States they require assistance with the following: Ambulates with walker.    Any transportation issues/concerns?: Not at this time.   Any patient concerns? Yes, patient questions if her dose of toprol should be changed due to elevated blood pressure earlier today.  170/79, 148/79. Notes her pulse was normal at this time. Taken now while on the phone with me and is 150/75 HR 59.   Confirmed importance and date/time of follow-up visits scheduled Yes, patient scheduled 08/30/19 @ 12:00. Virtual/doxy [phone] (daughters phone).  Provider Appointment booked with Dr. Nicki Reaper, pcp.   Confirmed with patient if condition begins to worsen call PCP or go to the ER.  Patient was given the office number and encouraged to call back with question or concerns.  : Yes

## 2019-08-25 ENCOUNTER — Telehealth: Payer: Self-pay | Admitting: Internal Medicine

## 2019-08-25 NOTE — Telephone Encounter (Signed)
-----   Message from Dia Crawford, LPN sent at [id number]  6:47 PM EDT ----- Regarding: BP Follow Up Dr.Azariah Bonura - I called the patient back again and asked her to check her BP one more time while I was on the phone before leaving this evening and it was 146/72, 61p.  She is aware there are no medication changes at this time. Daughter Judeen Hammans is with her and will continue to spot check BP and notify if there are any problems.   Thanks, Denisa

## 2019-08-28 NOTE — Telephone Encounter (Signed)
Pt stated to disregard. She does not need at this time.

## 2019-08-30 ENCOUNTER — Ambulatory Visit (INDEPENDENT_AMBULATORY_CARE_PROVIDER_SITE_OTHER): Payer: Medicare Other | Admitting: Internal Medicine

## 2019-08-30 ENCOUNTER — Other Ambulatory Visit: Payer: Self-pay

## 2019-08-30 DIAGNOSIS — I639 Cerebral infarction, unspecified: Secondary | ICD-10-CM | POA: Diagnosis not present

## 2019-08-30 DIAGNOSIS — I700 Atherosclerosis of aorta: Secondary | ICD-10-CM

## 2019-08-30 DIAGNOSIS — E7800 Pure hypercholesterolemia, unspecified: Secondary | ICD-10-CM

## 2019-08-30 DIAGNOSIS — K219 Gastro-esophageal reflux disease without esophagitis: Secondary | ICD-10-CM | POA: Diagnosis not present

## 2019-08-30 DIAGNOSIS — I10 Essential (primary) hypertension: Secondary | ICD-10-CM

## 2019-08-30 DIAGNOSIS — F419 Anxiety disorder, unspecified: Secondary | ICD-10-CM | POA: Diagnosis not present

## 2019-08-30 DIAGNOSIS — R739 Hyperglycemia, unspecified: Secondary | ICD-10-CM

## 2019-08-30 NOTE — Progress Notes (Signed)
[name], female   DOB: 02-13-1930, 85 y.o.   MRN: [id number]   Virtual Visit via video Note  This visit type was conducted due to national recommendations for restrictions regarding the COVID-19 pandemic (e.g. social distancing).  This format is felt to be most appropriate for this patient at this time.  All issues noted in this document were discussed and addressed.  No physical exam was performed (except for noted visual exam findings with Video Visits).   I connected with Galilee Pierron by a video enabled telemedicine application and verified that I am speaking with the correct person using two identifiers. Location patient: home Location provider: work Persons participating in the virtual visit: patient, provider and her two daughters.  I discussed the limitations, risks, security and privacy concerns of performing an evaluation and management service by video and the availability of in person appointments.  The patient expressed understanding and agreed to proceed.   Reason for visit: hospital follow up.   HPI: She was admitted 08/21/19-08/23/19 and diagnosed with acute CVA.  She presented to ED after falling.  Reports had a mechanical fall due to her legs feeling weak.  On the ground for about two hours.  Per daughter - pt was confused initially.  Did not go to ER initially.  Went the following day due to persistent weakness.  No focal deficit on arrival to ER.  Blood pressure elevated in ER.  Was noted to have multiple acue infarcts in the left MCA territory.  Was started on aspirin and plavix per neurology recommendation.  Started on statin.  Received PT and OT in hospital.  Since being home she reports feeling better.  States she is able to get around her house without difficulty.  Declines home health/PT.  Reports no chest pain.  No sob.  No acid reflux.  No abdominal pain.  Bowels moving.  Discussed need for f/u with neurology.  Also discussed the need for f/u with cardiology  - for possible TEE and question of need for monitor.     ROS: See pertinent positives and negatives per HPI.  Past Medical History:  Diagnosis Date  . Anxiety   . Collagen vascular disease ([place])   . Degenerative disc disease   . Depression   . GERD (gastroesophageal reflux disease)   . Hyperlipidemia   . Hypertension     Past Surgical History:  Procedure Laterality Date  . ABDOMINAL HYSTERECTOMY  1997   uterus and cervix removed, reports she still has her ovaries  . BREAST SURGERY  1960 to 1970   lumps removed  . CYSTOCELE REPAIR    . ESOPHAGOGASTRODUODENOSCOPY (EGD) WITH PROPOFOL N/A 10/03/2016   Procedure: ESOPHAGOGASTRODUODENOSCOPY (EGD) WITH PROPOFOL;  Surgeon: Manya Silvas, MD;  Location: [facility] ENDOSCOPY;  Service: Endoscopy;  Laterality: N/A;  . RECTOCELE REPAIR      Family History  Problem Relation Age of Onset  . Heart disease Mother   . Arthritis Mother   . Ulcers Father        uremic poisoning  . Arthritis Sister        x 4  . Hyperlipidemia Sister        x 1  . Hypertension Sister   . Cancer Sister        x 2 (ovarian cancer) & 1 (colon cancer)  . Diabetes Sister     SOCIAL HX: reviewed.    Current Outpatient Medications:  .  ALPRAZolam (XANAX) 0.5 MG tablet, TAKE  1/2 TABLET BY MOUTH ONCE DAILY AND 1 TABLET AT BEDTIME, Disp: 45 tablet, Rfl: 1 .  aspirin 81 MG tablet, Take 1 tablet (81 mg total) by mouth daily for 21 days., Disp: 21 tablet, Rfl: 0 .  clopidogrel (PLAVIX) 75 MG tablet, Take 1 tablet (75 mg total) by mouth daily., Disp: 30 tablet, Rfl: 0 .  famotidine (PEPCID) 20 MG tablet, TAKE 1 TABLET BY MOUTH  DAILY (Patient taking differently: Take 20 mg by mouth daily as needed. ), Disp: 90 tablet, Rfl: 1 .  metoprolol succinate (TOPROL-XL) 25 MG 24 hr tablet, TAKE ONE-HALF TABLET BY  MOUTH TWICE DAILY, Disp: 90 tablet, Rfl: 3 .  Multiple Vitamin (MULTIVITAMIN) tablet, Take 1 tablet by mouth daily., Disp: , Rfl:  .  rosuvastatin (CRESTOR) 20 MG  tablet, Take 1 tablet (20 mg total) by mouth daily at 6 PM., Disp: 30 tablet, Rfl: 0 .  vitamin C (ASCORBIC ACID) 250 MG tablet, Take 250 mg by mouth daily., Disp: , Rfl:  .  vitamin E 400 UNIT capsule, Take 400 Units by mouth daily., Disp: , Rfl:   EXAM:  VITALS per patient if applicable:  704/88  GENERAL: alert, oriented, appears well and in no acute distress  HEENT: atraumatic, conjunttiva clear, no obvious abnormalities on inspection of external nose and ears  NECK: normal movements of the head and neck  LUNGS: on inspection no signs of respiratory distress, breathing rate appears normal, no obvious gross SOB, gasping or wheezing  CV: no obvious cyanosis  PSYCH/NEURO: pleasant and cooperative, no obvious depression or anxiety, speech and thought processing grossly intact  ASSESSMENT AND PLAN:  Discussed the following assessment and plan:  Acute CVA (cerebrovascular accident) [facility]) Recently admitted as outlined.  Started on plavix and continues on aspirin.  W/up in hospital reviewed.  Appeared to be embolic cva.  Needs f/u with neurology and cardiology. Discussed with pt.  She had multiple questions about referrals.  Reluctantly agreed.  Currently doing better.  Declines PT/home health.    Anxiety Stable.    Aortic atherosclerosis (HCC) Declined cholesterol medication previously.  Now on crestor.  Started in hospital.    Essential hypertension, benign Blood pressure doing better now.  Blood pressures averaging 120-140/60-70s.  Continue current medication regimen.  Follow pressures.  Follow metabolic panel.   GERD (gastroesophageal reflux disease) Controlled.    Hypercholesterolemia On cholesterol medication now.  Follow lipid panel and liver function tests.    Hyperglycemia Low carb diet and exercise.  Follow met b and a1c.      I discussed the assessment and treatment plan with the patient. The patient was provided an opportunity to ask questions and all were  answered. The patient agreed with the plan and demonstrated an understanding of the instructions.   The patient was advised to call back or seek an in-person evaluation if the symptoms worsen or if the condition fails to improve as anticipated.   Einar Pheasant, MD

## 2019-08-31 ENCOUNTER — Telehealth: Payer: Self-pay | Admitting: Internal Medicine

## 2019-08-31 ENCOUNTER — Telehealth: Payer: Self-pay

## 2019-08-31 ENCOUNTER — Ambulatory Visit: Payer: Self-pay | Admitting: Internal Medicine

## 2019-08-31 NOTE — Telephone Encounter (Signed)
Pt would like to cancel her surgery with Dr Ubaldo Glassing office, pt says that she was told to contact PCP since it hasnt been schedule yet. Pt would like to decline having echocardiogram .   Pt would like further assistance with this.

## 2019-08-31 NOTE — Telephone Encounter (Signed)
See attached message also.  The reason for the referral to cardiology was to do some other testing to see if we could figure out why she had the stroke.  If she changes her mind, let me know.  Will keep the neurology referral.

## 2019-08-31 NOTE — Telephone Encounter (Signed)
See other note

## 2019-08-31 NOTE — Telephone Encounter (Signed)
In reviewing, it appears that the area has stopped bleeding now.  Agree with applying pressure as outlined.  If persistent bleeding, will need to let us know.

## 2019-08-31 NOTE — Telephone Encounter (Signed)
Pt advised of below

## 2019-08-31 NOTE — Telephone Encounter (Signed)
Patient states that she is not keeping the appointments with cardiology or neurology. She does not feel this is necessary because she is 85 and does not plan to live forever. Advised that she really should keep the neurology referral. Pt declined.

## 2019-08-31 NOTE — Telephone Encounter (Signed)
Pt reports she "Tore off a small piece of dry skin from edge of right nare. States bled " A lot" from area. States stopped for about 2 hours, started bleeding again during call.  Pt states she got it stopped earlier with ice and pressure. Bleeding is only from area she picked skin off. States "Very small area". Calling to question if she should stop taking ASA 59m daily as she was placed on Plavix S/P CVA 08/21/2019.  Saw Dr. Nicki Reaper yesterday. Denies dizziness.  Advised TN would route to practice for Dr. Bary Leriche review.  Also advised TN would CB to make sure bleeding that occurred during call had stopped once pt had applied pressure and ice as she had done earlier.  CB# [phone]  1350.  Called pt back, bleeding stopped with pressure to wound. Advised UC if reoccurred, States "Oh I won't go there, I just got out of the hospital."  Reason for Disposition . ALSO, superficial cut (scratch) or abrasion (scrape) is present  Answer Assessment - Initial Assessment Questions 1. MECHANISM: "How did the injury happen?"      "Pulled off a littl piece of dry skin, right nare, edge 2. ONSET: "When did the injury happen?" (Minutes or hours ago)      1100 3. LOCATION: "What part of the nose is injured?"      *No Answer* 4. APPEARANCE of INJURY: "What does the nose look like?"      *No Answer* 5. BLEEDING: "Is the nose still bleeding?" If so, ask: "Is it difficult to stop?"      Stopped for a few hours, started again, mild 6. SIZE: For cuts, bruises, or swelling, ask: "How large is it?" (e.g., inches or centimeters;  entire nose)      *No Answer* 7. PAIN: "Is it painful?" If so, ask: "How bad is the pain?"   (Scale 1-10; or mild, moderate, severe)     *No Answer* 8. TETANUS: For any breaks in the skin, ask: "When was the last tetanus booster?"     *No Answer* 9. OTHER SYMPTOMS: "Do you have any other symptoms?" (e.g., headache, neck pain, loss of consciousness)     NO  Protocols used: NOSE  INJURY-A-AH

## 2019-08-31 NOTE — Telephone Encounter (Signed)
Copied from [place] [id number]. Topic: General - Inquiry >> Aug 31, 2019  9:10 AM Scherrie Gerlach wrote: Reason for CRM: pt wants dr Nicki Reaper to know she is going to cancel the appt with Dr Ubaldo Glassing. If Dr Nicki Reaper has not made the appt, please do not.

## 2019-08-31 NOTE — Telephone Encounter (Signed)
Patient does not want to see cardiology.

## 2019-09-03 ENCOUNTER — Encounter: Payer: Self-pay | Admitting: Internal Medicine

## 2019-09-03 NOTE — Assessment & Plan Note (Signed)
Stable

## 2019-09-03 NOTE — Assessment & Plan Note (Signed)
Blood pressure doing better now.  Blood pressures averaging 120-140/60-70s.  Continue current medication regimen.  Follow pressures.  Follow metabolic panel.

## 2019-09-03 NOTE — Assessment & Plan Note (Signed)
Low carb diet and exercise.  Follow met b and a1c.   

## 2019-09-03 NOTE — Assessment & Plan Note (Addendum)
Declined cholesterol medication previously.  Now on crestor.  Started in hospital.

## 2019-09-03 NOTE — Assessment & Plan Note (Signed)
Recently admitted as outlined.  Started on plavix and continues on aspirin.  W/up in hospital reviewed.  Appeared to be embolic cva.  Needs f/u with neurology and cardiology. Discussed with pt.  She had multiple questions about referrals.  Reluctantly agreed.  Currently doing better.  Declines PT/home health.

## 2019-09-03 NOTE — Assessment & Plan Note (Signed)
On cholesterol medication now.  Follow lipid panel and liver function tests.

## 2019-09-03 NOTE — Assessment & Plan Note (Signed)
Controlled.  

## 2019-09-06 ENCOUNTER — Other Ambulatory Visit: Payer: Self-pay | Admitting: Internal Medicine

## 2019-09-06 NOTE — Telephone Encounter (Signed)
Requested medication (s) are due for refill today: yes  Requested medication (s) are on the active medication list: yes  Last refill: 08/23/2019  Future visit scheduled: yes  Notes to clinic:  Patient states that she received these prescriptions from the hospital after being discharged. States that she has 2 weeks left of the medications, but needs the prescription from Dr Nicki Reaper to be sent to mail order so they can process and get these sent to her before running out. Please advise   Requested Prescriptions  Pending Prescriptions Disp Refills   clopidogrel (PLAVIX) 75 MG tablet 30 tablet 0    Sig: Take 1 tablet (75 mg total) by mouth daily.     Hematology: Antiplatelets - clopidogrel Failed - 09/06/2019 11:20 AM      Failed - Evaluate AST, ALT within 2 months of therapy initiation.      Failed - ALT in normal range and within 360 days    ALT  Date Value Ref Range Status  07/01/2018 9 0 - 35 U/L Final         Failed - AST in normal range and within 360 days    AST  Date Value Ref Range Status  07/01/2018 15 0 - 37 U/L Final         Passed - HCT in normal range and within 180 days    HCT  Date Value Ref Range Status  08/21/2019 44.4 36.0 - 46.0 % Final         Passed - HGB in normal range and within 180 days    Hemoglobin  Date Value Ref Range Status  08/21/2019 14.9 12.0 - 15.0 g/dL Final         Passed - PLT in normal range and within 180 days    Platelets  Date Value Ref Range Status  08/21/2019 318 150 - 400 K/uL Final         Passed - Valid encounter within last 6 months    Recent Outpatient Visits          1 week ago Acute CVA (cerebrovascular accident) ([place])   [facility] [place], Randell Patient, MD   4 months ago [place] [facility], Randell Patient, MD   11 months ago Routine general medical examination at a health care facility   [facility], [place], [place]   1 year ago Aortic atherosclerosis  [facility])   [facility] [place], Randell Patient, MD   2 years ago Routine general medical examination at a health care facility   [facility], Randell Patient, MD      Future Appointments            In 6 days O'Brien-Blaney, Bryson [place]ona, LPN [place], [place]   In 1 month Einar Pheasant, MD [place], [facility]            rosuvastatin (CRESTOR) 20 MG tablet 30 tablet 0    Sig: Take 1 tablet (20 mg total) by mouth daily at 6 PM.     Cardiovascular:  Antilipid - Statins Failed - 09/06/2019 11:20 AM      Failed - Total Cholesterol in normal range and within 360 days    Cholesterol  Date Value Ref Range Status  08/22/2019 209 (H) 0 - 200 mg/dL Final         Failed - LDL in normal range and within 360 days    LDL Cholesterol  Date Value Ref Range Status  08/22/2019 147 (H) 0 - 99 mg/dL Final    Comment:           Total Cholesterol/HDL:CHD Risk Coronary Heart Disease Risk Table                     Men   Women  1/2 Average Risk   3.4   3.3  Average Risk       5.0   4.4  2 X Average Risk   9.6   7.1  3 X Average Risk  23.4   11.0        Use the calculated Patient Ratio above and the CHD Risk Table to determine the patient's CHD Risk.        ATP III CLASSIFICATION (LDL):  <100     mg/dL   Optimal  100-129  mg/dL   Near or Above                    Optimal  130-159  mg/dL   Borderline  160-189  mg/dL   High  >190     mg/dL   Very High Performed at [facility], [address] [place], [place] [address]          Passed - HDL in normal range and within 360 days    HDL  Date Value Ref Range Status  08/22/2019 41 >40 mg/dL Final         Passed - Triglycerides in normal range and within 360 days    Triglycerides  Date Value Ref Range Status  08/22/2019 106 <150 mg/dL Final         Passed - Patient is not pregnant      Passed - Valid encounter within last 12 months    Recent Outpatient Visits           1 week ago Acute CVA (cerebrovascular accident) [facility])   [facility] [place], Randell Patient, MD   4 months ago [place] Scott, Randell Patient, MD   11 months ago Routine general medical examination at a health care facility   [place], MD   1 year ago Aortic atherosclerosis [facility])   [facility] [place], Randell Patient, MD   2 years ago Routine general medical examination at a health care facility   [facility], MD      Future Appointments            In 6 days O'Brien-Blaney, Bryson Corona, [place], [place]   In 1 month Einar Pheasant, MD [place], [place]

## 2019-09-06 NOTE — Telephone Encounter (Signed)
Patient states that she received these prescriptions from the hospital after being discharged. States that she has 2 weeks left of the medications, but needs the prescription from Dr Nicki Reaper to be sent to mail order so they can process and get these sent to her before running out. Please advise. **  Medication Refill - Medication: clopidogrel (PLAVIX) 75 MG tablet rosuvastatin (CRESTOR) 20 MG tablet   Has the patient contacted their pharmacy? Yes.   (Agent: If no, request that the patient contact the pharmacy for the refill.) (Agent: If yes, when and what did the pharmacy advise?)  Preferred Pharmacy (with phone number or street name): [facility]  Agent: Please be advised that RX refills may take up to 3 business days. We ask that you follow-up with your pharmacy.

## 2019-09-11 ENCOUNTER — Other Ambulatory Visit: Payer: Self-pay

## 2019-09-11 MED ORDER — CLOPIDOGREL BISULFATE 75 MG PO TABS: 75.0000 mg | ORAL_TABLET | Freq: Every day | ORAL | 0 refills | Status: DC

## 2019-09-11 MED ORDER — CLOPIDOGREL BISULFATE 75 MG PO TABS: 75.0000 mg | ORAL_TABLET | Freq: Every day | ORAL | 1 refills | Status: DC

## 2019-09-11 MED ORDER — ROSUVASTATIN CALCIUM 20 MG PO TABS: 20.0000 mg | ORAL_TABLET | Freq: Every day | ORAL | 0 refills | Status: DC

## 2019-09-12 ENCOUNTER — Other Ambulatory Visit: Payer: Self-pay

## 2019-09-12 ENCOUNTER — Inpatient Hospital Stay: Payer: Medicare Other | Admitting: Internal Medicine

## 2019-09-12 ENCOUNTER — Encounter

## 2019-09-12 ENCOUNTER — Ambulatory Visit (INDEPENDENT_AMBULATORY_CARE_PROVIDER_SITE_OTHER): Payer: Medicare Other

## 2019-09-12 DIAGNOSIS — Z0000 Encounter for general adult medical examination without abnormal findings: Secondary | ICD-10-CM

## 2019-09-12 NOTE — Patient Instructions (Addendum)
  Sandra Reyes , Thank you for taking time to come for your Medicare Wellness Visit. I appreciate your ongoing commitment to your health goals. Please review the following plan we discussed and let me know if I can assist you in the future.   These are the goals we discussed: Goals      Patient Stated   . Increase physical activity (pt-stated)     I plan to do more leg strengthening exercises I want to lose about 20lbs with activity and portion control       This is a list of the screening recommended for you and due dates:  Health Maintenance  Topic Date Due  . DEXA scan (bone density measurement)  03/06/2020*  . Tetanus Vaccine  04/06/2020*  . Flu Shot  Discontinued  . Mammogram  Discontinued  . Pneumonia vaccines  Discontinued  *Topic was postponed. The date shown is not the original due date.

## 2019-09-12 NOTE — Progress Notes (Addendum)
Subjective:   Sandra Reyes is a 85 y.o. female who presents for an Initial Medicare Annual Wellness Visit.  Review of Systems    No ROS.  Medicare Wellness Virtual Visit.  Visual/audio telehealth visit, UTA vital signs.   See social history for additional risk factors.   Cardiac Risk Factors include: advanced age (>24men, >58 women);hypertension     Objective:    Today's Vitals   There is no height or weight on file to calculate BMI.  Advanced Directives 09/12/2019 08/21/2019 08/21/2019 10/02/2016 10/02/2016  Does Patient Have a Medical Advance Directive? No No No No No  Would patient like information on creating a medical advance directive? No - Patient declined No - Patient declined - No - patient declined information No - patient declined information    Current Medications (verified) Outpatient Encounter Medications as of 09/12/2019  Medication Sig  . ALPRAZolam (XANAX) 0.5 MG tablet TAKE 1/2 TABLET BY MOUTH ONCE DAILY AND 1 TABLET AT BEDTIME  . aspirin 81 MG tablet Take 1 tablet (81 mg total) by mouth daily for 21 days.  . clopidogrel (PLAVIX) 75 MG tablet Take 1 tablet (75 mg total) by mouth daily.  . famotidine (PEPCID) 20 MG tablet TAKE 1 TABLET BY MOUTH  DAILY (Patient taking differently: Take 20 mg by mouth daily as needed. )  . metoprolol succinate (TOPROL-XL) 25 MG 24 hr tablet TAKE ONE-HALF TABLET BY  MOUTH TWICE DAILY  . Multiple Vitamin (MULTIVITAMIN) tablet Take 1 tablet by mouth daily.  . rosuvastatin (CRESTOR) 20 MG tablet Take 1 tablet (20 mg total) by mouth daily at 6 PM.  . vitamin C (ASCORBIC ACID) 250 MG tablet Take 250 mg by mouth daily.  . vitamin E 400 UNIT capsule Take 400 Units by mouth daily.   No facility-administered encounter medications on file as of 09/12/2019.     Allergies (verified) Celexa [citalopram], Effexor [venlafaxine], Lipitor [atorvastatin], Macrobid [nitrofurantoin macrocrystal], Nortriptyline, Penicillin v potassium, Prozac  [fluoxetine], and Sulfa antibiotics   History: Past Medical History:  Diagnosis Date  . Anxiety   . Collagen vascular disease ([place])   . Degenerative disc disease   . Depression   . GERD (gastroesophageal reflux disease)   . Hyperlipidemia   . Hypertension    Past Surgical History:  Procedure Laterality Date  . ABDOMINAL HYSTERECTOMY  1997   uterus and cervix removed, reports she still has her ovaries  . BREAST SURGERY  1960 to 1970   lumps removed  . CYSTOCELE REPAIR    . ESOPHAGOGASTRODUODENOSCOPY (EGD) WITH PROPOFOL N/A 10/03/2016   Procedure: ESOPHAGOGASTRODUODENOSCOPY (EGD) WITH PROPOFOL;  Surgeon: Manya Silvas, MD;  Location: [facility] ENDOSCOPY;  Service: Endoscopy;  Laterality: N/A;  . RECTOCELE REPAIR     Family History  Problem Relation Age of Onset  . Heart disease Mother   . Arthritis Mother   . Ulcers Father        uremic poisoning  . Arthritis Sister        x 4  . Hyperlipidemia Sister        x 1  . Hypertension Sister   . Cancer Sister        x 2 (ovarian cancer) & 1 (colon cancer)  . Diabetes Sister   . Dementia Brother   . Dementia Brother    Social History   Socioeconomic History  . Marital status: Married    Spouse name: Not on file  . Number of children: Not on file  .  Years of education: Not on file  . Highest education level: Not on file  Occupational History  . Not on file  Social Needs  . Financial resource strain: Not hard at all  . Food insecurity    Worry: Never true    Inability: Never true  . Transportation needs    Medical: No    Non-medical: No  Tobacco Use  . Smoking status: Never Smoker  . Smokeless tobacco: Never Used  Substance and Sexual Activity  . Alcohol use: No    Alcohol/week: 0.0 standard drinks  . Drug use: No  . Sexual activity: Not Currently    Birth control/protection: None  Lifestyle  . Physical activity    Days per week: Not on file    Minutes per session: Not on file  . Stress: Not at all   Relationships  . Social Herbalist on phone: Not on file    Gets together: Not on file    Attends religious service: Not on file    Active member of club or organization: Not on file    Attends meetings of clubs or organizations: Not on file    Relationship status: Not on file  Other Topics Concern  . Not on file  Social History Narrative  . Not on file    Tobacco Counseling Counseling given: Not Answered   Clinical Intake:  Pre-visit preparation completed: Yes        Diabetes: No  How often do you need to have someone help you when you read instructions, pamphlets, or other written materials from your doctor or pharmacy?: 1 - Never  Interpreter Needed?: No      Activities of Daily Living In your present state of health, do you have any difficulty performing the following activities: 09/12/2019 08/21/2019  Hearing? N N  Vision? N N  Difficulty concentrating or making decisions? Tempie Donning  Walking or climbing stairs? Y Y  Comment Unsteady gait. Walker in use. -  Dressing or bathing? N Y  Doing errands, shopping? Y Y  Comment She is uncomfortable driving alone -  Conservation officer, nature and eating ? N -  Using the Toilet? N -  In the past six months, have you accidently leaked urine? N -  Do you have problems with loss of bowel control? N -  Managing your Medications? N -  Managing your Finances? N -  Housekeeping or managing your Housekeeping? N -  Some recent data might be hidden     Immunizations and Health Maintenance Immunization History  Administered Date(s) Administered  . Influenza Whole 10/07/2011  . Influenza,inj,Quad PF,6+ Mos 09/06/2013   There are no preventive care reminders to display for this patient.  Patient Care Team: Einar Pheasant, MD as PCP - General (Internal Medicine)  Indicate any recent Medical Services you may have received from other than [facility] providers in the past year (date may be approximate).     Assessment:   This is a  routine wellness examination for Sandra Reyes.  I connected with patient 09/12/19 at 12:30 PM EDT by an audio enabled telemedicine application and verified that I am speaking with the correct person using two identifiers. Patient stated full name and DOB. Patient gave permission to continue with virtual visit. Patient's location was at home and Nurse's location was at [place] office.   Health Maintenance Due: See completed HM at the end of note.   Eye: Visual acuity not assessed. Virtual visit. Wears corrective lenses. Followed by  their ophthalmologist every 12 months.   Dental: Visits every 6 months.    Hearing: Demonstrates normal hearing during visit.  Safety:  Patient feels safe at home- yes Patient does have smoke detectors at home- yes Patient does wear sunscreen or protective clothing when in direct sunlight - yes Patient does wear seat belt when in a moving vehicle - yes Patient drives- not currently Adequate lighting in walkways free from debris- yes Grab bars and handrails used as appropriate- yes Ambulates with a walker as an assistive device Cell phone or lifeline/life alert/medic alert on person when ambulating outside of the home- yes  Social: Alcohol intake - no    Smoking history- never   Smokers in home? none Illicit drug use? none  Depression: PHQ 2 &9 complete. See screening below. Denies irritability, anhedonia, sadness/tearfullness.  Stable.   Falls: See screening below.    Medication: Taking as directed and without issues.   Covid-19: Precautions and sickness symptoms discussed. Wears mask, social distancing, hand hygiene as appropriate.   Activities of Daily Living Patient denies needing assistance with: household chores, feeding themselves, getting from bed to chair, getting to the toilet, bathing/showering, dressing, managing money, or preparing meals.   Memory: Patient is alert. Patient denies difficulty focusing or concentrating. Correctly  identified the president of the [place], season and recall. Patient likes to read several books a week for brain stimulation.  BMI- discussed the importance of a healthy diet, water intake and the benefits of aerobic exercise.  Educational material provided.  Physical activity- no routine. Encouraged to stay active.   Diet: regular Water: good intake Caffeine: 2 cups of tea  Other Providers Patient Care Team: Einar Pheasant, MD as PCP - General (Internal Medicine) Hearing/Vision screen  Hearing Screening   125Hz  250Hz  500Hz  1000Hz  2000Hz  3000Hz  4000Hz  6000Hz  8000Hz   Right ear:           Left ear:           Comments: Patient is able to hear conversational tones without difficulty.  No issues reported.    Vision Screening Comments: Wears corrective lenses Cataract extraction, L eye Visual acuity not assessed, virtual visit.       Dietary issues and exercise activities discussed:    Goals      Patient Stated   . Increase physical activity (pt-stated)     I plan to do more leg strengthening exercises I want to lose about 20lbs with activity and portion control      Depression Screen PHQ 2/9 Scores 09/12/2019 05/13/2017 05/13/2017 03/07/2015 09/06/2013  PHQ - 2 Score 0 0 0 0 0  PHQ- 9 Score - 0 - - -    Fall Risk Fall Risk  09/12/2019 05/13/2017 03/07/2015 09/06/2013  Falls in the past year? 1 No No No  Number falls in past yr: 0 - - -  Comment stroke - - -  Injury with Fall? 0 - - -  Risk for fall due to : - (No Data) - -  Risk for fall due to: Comment - no falls per patient  - -   Timed Get Up and Go Performed no, virtual visit  Cognitive Function:     6CIT Screen 09/12/2019  What Year? 0 points  What month? 0 points  What time? 0 points  Count back from 20 0 points  Months in reverse 4 points  Repeat phrase 2 points  Total Score 6    Screening Tests Health Maintenance  Topic Date Due  .  DEXA SCAN  03/06/2020 (Originally 02/05/1997)  . TETANUS/TDAP  04/06/2020  (Originally 02/06/1949)  . INFLUENZA VACCINE  Discontinued  . MAMMOGRAM  Discontinued  . PNA vac Low Risk Adult  Discontinued     Plan:   Keep all routine maintenance appointments.   Follow up 11/03/19 @ 9:30  Medicare Attestation I have personally reviewed: The patient's medical and social history Their use of alcohol, tobacco or illicit drugs Their current medications and supplements The patient's functional ability including ADLs,fall risks, home safety risks, cognitive, and hearing and visual impairment Diet and physical activities Evidence for depression   In addition, I have reviewed and discussed with patient certain preventive protocols, quality metrics, and best practice recommendations. A written personalized care plan for preventive services as well as general preventive health recommendations were provided to patient via mail.     Varney Biles, LPN   [id number]    Reviewed above information.  Agree with assessment and plan.    Dr Nicki Reaper

## 2019-09-26 ENCOUNTER — Other Ambulatory Visit: Payer: Self-pay

## 2019-09-26 NOTE — Patient Outreach (Signed)
[facility]) Care Management  09/26/2019  PEGI HEIBERGER 19-May-1930 [id number]   Medication Adherence call to [place] spoke with patient she is past due on Rosuvastatin 20 mg,patient explain she is no longer taking this medication ,patient was having side effects and doctor ask to stop the medication until seen again in December/2020. Mrs. Brannock is showing past due under [place].   [place] Management Direct Dial [phone]  Fax [phone] [email or web address]

## 2019-10-09 ENCOUNTER — Other Ambulatory Visit: Payer: Self-pay | Admitting: Internal Medicine

## 2019-10-09 NOTE — Telephone Encounter (Signed)
Medication Refill - Medication:  ALPRAZolam (XANAX) 0.5 MG tablet   Has the patient contacted their pharmacy?  Yes advised to call.   Preferred Pharmacy (with phone number or street name):  [facility], [place]. [phone] (Phone) [phone] (Fax)   Agent: Please be advised that RX refills may take up to 3 business days. We ask that you follow-up with your pharmacy.

## 2019-10-09 NOTE — Telephone Encounter (Signed)
Requested medication (s) are due for refill today: yes  Requested medication (s) are on the active medication list: yes  Last refill:  06/20/2019  Future visit scheduled: yes  Notes to clinic:  Refill cannot be delegated    Requested Prescriptions  Pending Prescriptions Disp Refills   ALPRAZolam (XANAX) 0.5 MG tablet 45 tablet 1    Sig: TAKE 1/2 TABLET BY MOUTH ONCE DAILY AND 1 TABLET AT BEDTIME     Not Delegated - Psychiatry:  Anxiolytics/Hypnotics Failed - 10/09/2019  9:33 AM      Failed - This refill cannot be delegated      Failed - Urine Drug Screen completed in last 360 days.      Passed - Valid encounter within last 6 months    Recent Outpatient Visits          1 month ago Acute CVA (cerebrovascular accident) [facility])   [facility], Randell Patient, MD   5 months ago [place] [facility], Randell Patient, MD   1 year ago Routine general medical examination at a health care facility   [facility], MD   1 year ago Aortic atherosclerosis [facility])   [facility], MD   2 years ago Routine general medical examination at a health care facility   [facility], MD      Future Appointments            In 3 weeks Einar Pheasant, MD [facility], [place]   In 11 months [facility], Bryson [place]ona, [place], [place]

## 2019-10-10 ENCOUNTER — Other Ambulatory Visit: Payer: Self-pay | Admitting: Internal Medicine

## 2019-10-10 NOTE — Telephone Encounter (Signed)
rx ok'd for alprazolam #45 with no refills.

## 2019-11-03 ENCOUNTER — Encounter: Payer: Self-pay | Admitting: Internal Medicine

## 2019-11-03 ENCOUNTER — Other Ambulatory Visit: Payer: Self-pay

## 2019-11-03 ENCOUNTER — Ambulatory Visit (INDEPENDENT_AMBULATORY_CARE_PROVIDER_SITE_OTHER): Payer: Medicare Other | Admitting: Internal Medicine

## 2019-11-03 DIAGNOSIS — Z8673 Personal history of transient ischemic attack (TIA), and cerebral infarction without residual deficits: Secondary | ICD-10-CM

## 2019-11-03 DIAGNOSIS — I10 Essential (primary) hypertension: Secondary | ICD-10-CM

## 2019-11-03 DIAGNOSIS — K219 Gastro-esophageal reflux disease without esophagitis: Secondary | ICD-10-CM

## 2019-11-03 DIAGNOSIS — I700 Atherosclerosis of aorta: Secondary | ICD-10-CM

## 2019-11-03 DIAGNOSIS — F419 Anxiety disorder, unspecified: Secondary | ICD-10-CM | POA: Diagnosis not present

## 2019-11-03 DIAGNOSIS — E7800 Pure hypercholesterolemia, unspecified: Secondary | ICD-10-CM

## 2019-11-03 DIAGNOSIS — R739 Hyperglycemia, unspecified: Secondary | ICD-10-CM

## 2019-11-03 MED ORDER — LOVASTATIN 10 MG PO TABS: ORAL_TABLET | ORAL | 1 refills | Status: DC

## 2019-11-03 NOTE — Progress Notes (Addendum)
[name], female   DOB: Jun 12, 1930, 85 y.o.   MRN: [id number]   Virtual Visit via telephone Note  This visit type was conducted due to national recommendations for restrictions regarding the COVID-19 pandemic (e.g. social distancing).  This format is felt to be most appropriate for this patient at this time.  All issues noted in this document were discussed and addressed.  No physical exam was performed (except for noted visual exam findings with Video Visits).   I connected with Sarahlynn Cisnero by a video enabled telemedicine application and verified that I am speaking with the correct person using two identifiers. Location patient: home Location provider: work  Persons participating in the telephone visit: patient, provider  I discussed the limitations, risks, security and privacy concerns of performing an evaluation and management service by telephone and the availability of in person appointments. The patient expressed understanding and agreed to proceed.   Reason for visit: scheduled follow up  HPI: Admitted 9/21 -08/23/19 - for CVA.  Has been on aspirin and plavix and doing well.  States she feels better than she did prior to her CVA.  Getting around better.  Hips better.  No headache.  No significant dizziness.  No chest pain.  No sob.  No acid reflux.  No abdominal pain.  Bowels moving.  Blood pressure doing well.  Taking her medication.  She cancelled her cardiology appt and neurology appt.  Does not feel she needs.  States feels good. Declines all immunizations.  Declines flu shot pneumonia shot, shingles vaccine.  Handling stress.  Still takes xanax, but taking less.     ROS: See pertinent positives and negatives per HPI.  Past Medical History:  Diagnosis Date  . Anxiety   . Collagen vascular disease ([place])   . Degenerative disc disease   . Depression   . GERD (gastroesophageal reflux disease)   . Hyperlipidemia   . Hypertension     Past Surgical History:   Procedure Laterality Date  . ABDOMINAL HYSTERECTOMY  1997   uterus and cervix removed, reports she still has her ovaries  . BREAST SURGERY  1960 to 1970   lumps removed  . CYSTOCELE REPAIR    . ESOPHAGOGASTRODUODENOSCOPY (EGD) WITH PROPOFOL N/A 10/03/2016   Procedure: ESOPHAGOGASTRODUODENOSCOPY (EGD) WITH PROPOFOL;  Surgeon: Manya Silvas, MD;  [facility] ENDOSCOPY;  Service: Endoscopy;  Laterality: N/A;  . RECTOCELE REPAIR      Family History  Problem Relation Age of Onset  . Heart disease Mother   . Arthritis Mother   . Ulcers Father        uremic poisoning  . Arthritis Sister        x 4  . Hyperlipidemia Sister        x 1  . Hypertension Sister   . Cancer Sister        x 2 (ovarian cancer) & 1 (colon cancer)  . Diabetes Sister   . Dementia Brother   . Dementia Brother     SOCIAL HX: reviewed.    Current Outpatient Medications:  .  Alpha-Lipoic Acid 300 MG TABS, Take 300 mg by mouth daily., Disp: , Rfl:  .  ALPRAZolam (XANAX) 0.5 MG tablet, TAKE 1/2 TABLET BY MOUTH ONCE DAILY & 1 TAB AT BEDTIME, Disp: 45 tablet, Rfl: 0 .  clopidogrel (PLAVIX) 75 MG tablet, Take 1 tablet (75 mg total) by mouth daily., Disp: 90 tablet, Rfl: 1 .  famotidine (PEPCID) 20 MG tablet, TAKE 1  TABLET BY MOUTH  DAILY (Patient taking differently: Take 20 mg by mouth daily as needed. ), Disp: 90 tablet, Rfl: 1 .  lovastatin (MEVACOR) 10 MG tablet, Take one tablet q Monday, Wednesday and friday, Disp: 13 tablet, Rfl: 1 .  metoprolol succinate (TOPROL-XL) 25 MG 24 hr tablet, TAKE ONE-HALF TABLET BY  MOUTH TWICE DAILY, Disp: 90 tablet, Rfl: 3 .  Multiple Vitamin (MULTIVITAMIN) tablet, Take 1 tablet by mouth daily., Disp: , Rfl:  .  vitamin C (ASCORBIC ACID) 250 MG tablet, Take 250 mg by mouth daily., Disp: , Rfl:  .  vitamin E 400 UNIT capsule, Take 400 Units by mouth daily., Disp: , Rfl:   EXAM:  VITALS per patient if applicable: 551/61, 68  GENERAL: alert.  Sounds to be in no acute distress.   Answering questions appropriately.    PSYCH/NEURO: pleasant and cooperative, no obvious depression or anxiety, speech and thought processing grossly intact  ASSESSMENT AND PLAN:  Discussed the following assessment and plan:  Anxiety Doing better.  Continues taking xanax, but less.  Follow.    Aortic atherosclerosis (HCC) Declines cholesterol medication and continues to decline.    Essential hypertension, benign Blood pressure has been doing ok - per report.  Follow pressures.  Continue current medication regimen.  Follow metabolic panel.   GERD (gastroesophageal reflux disease) Controlled.   History of CVA (cerebrovascular accident) Recently admitted with CVA.  Remains on plavix and aspirin.  Doing well.  Declines further cardiac w/up as outlined.  Declines neurology f/u.    Hypercholesterolemia Not on cholesterol medication now.  Discussed with her today.  She agrees to start mevacor.  Has had problems with previous statin medications.  Willing to try mevacor - 3 days per week.  Follow lipid panel and liver function tests.    Hyperglycemia Low carb diet and exercise.  Follow met b and a1c.     I discussed the assessment and treatment plan with the patient. The patient was provided an opportunity to ask questions and all were answered. The patient agreed with the plan and demonstrated an understanding of the instructions.   The patient was advised to call back or seek an in-person evaluation if the symptoms worsen or if the condition fails to improve as anticipated.  I provided 16 minutes of non-face-to-face time during this encounter.   Einar Pheasant, MD

## 2019-11-05 ENCOUNTER — Encounter: Payer: Self-pay | Admitting: Internal Medicine

## 2019-11-05 NOTE — Assessment & Plan Note (Signed)
Controlled.  

## 2019-11-05 NOTE — Assessment & Plan Note (Signed)
Blood pressure has been doing ok - per report.  Follow pressures.  Continue current medication regimen.  Follow metabolic panel.

## 2019-11-05 NOTE — Assessment & Plan Note (Signed)
Low carb diet and exercise.  Follow met b and a1c.  

## 2019-11-05 NOTE — Assessment & Plan Note (Signed)
Doing better.  Continues taking xanax, but less.  Follow.

## 2019-11-05 NOTE — Assessment & Plan Note (Addendum)
Not on cholesterol medication now.  Discussed with her today.  She agrees to start mevacor.  Has had problems with previous statin medications.  Willing to try mevacor - 3 days per week.  Follow lipid panel and liver function tests.

## 2019-11-05 NOTE — Assessment & Plan Note (Signed)
Recently admitted with CVA.  Remains on plavix and aspirin.  Doing well.  Declines further cardiac w/up as outlined.  Declines neurology f/u.

## 2019-11-05 NOTE — Assessment & Plan Note (Signed)
Declines cholesterol medication and continues to decline.

## 2019-11-28 ENCOUNTER — Telehealth: Payer: Self-pay | Admitting: Internal Medicine

## 2019-11-28 NOTE — Telephone Encounter (Signed)
Pt states she needs a refill on Alprazolam. She only has 5 tablets left. Please send refill to [facility] in [place].

## 2019-11-29 MED ORDER — ALPRAZOLAM 0.5 MG PO TABS: ORAL_TABLET | ORAL | 0 refills | Status: DC

## 2019-11-29 NOTE — Telephone Encounter (Signed)
Alprazolam last filled 10/10/19 last OV 11/03/19 ok to fill?

## 2019-11-29 NOTE — Telephone Encounter (Signed)
Sent to [facility] since she is about to run  Out

## 2019-12-29 ENCOUNTER — Other Ambulatory Visit: Payer: Self-pay

## 2019-12-29 ENCOUNTER — Other Ambulatory Visit (INDEPENDENT_AMBULATORY_CARE_PROVIDER_SITE_OTHER): Payer: Medicare Other

## 2019-12-29 DIAGNOSIS — R739 Hyperglycemia, unspecified: Secondary | ICD-10-CM | POA: Diagnosis not present

## 2019-12-29 DIAGNOSIS — E7800 Pure hypercholesterolemia, unspecified: Secondary | ICD-10-CM | POA: Diagnosis not present

## 2019-12-29 DIAGNOSIS — I10 Essential (primary) hypertension: Secondary | ICD-10-CM | POA: Diagnosis not present

## 2019-12-29 LAB — LIPID PANEL
Cholesterol: 229 mg/dL — ABNORMAL HIGH (ref 0–200)
HDL: 50.8 mg/dL (ref >39.00)
LDL Cholesterol: 157 mg/dL — ABNORMAL HIGH (ref 0–99)
NonHDL: 177.78
Total CHOL/HDL Ratio: 4
Triglycerides: 102 mg/dL (ref 0.0–149.0)
VLDL: 20.4 mg/dL (ref 0.0–40.0)

## 2019-12-29 LAB — HEMOGLOBIN A1C: Hgb A1c MFr Bld: 6 % (ref 4.6–6.5)

## 2019-12-29 LAB — HEPATIC FUNCTION PANEL
ALT: 11 U/L (ref 0–35)
AST: 18 U/L (ref 0–37)
Albumin: 4 g/dL (ref 3.5–5.2)
Alkaline Phosphatase: 62 U/L (ref 39–117)
Bilirubin, Direct: 0.1 mg/dL (ref 0.0–0.3)
Total Bilirubin: 0.5 mg/dL (ref 0.2–1.2)
Total Protein: 7.1 g/dL (ref 6.0–8.3)

## 2019-12-29 LAB — BASIC METABOLIC PANEL
BUN: 17 mg/dL (ref 6–23)
CO2: 30 mEq/L (ref 19–32)
Calcium: 9.1 mg/dL (ref 8.4–10.5)
Chloride: 100 mEq/L (ref 96–112)
Creatinine, Ser: 0.78 mg/dL (ref 0.40–1.20)
GFR: 69.72 mL/min (ref >60.00)
Glucose, Bld: 97 mg/dL (ref 70–99)
Potassium: 4 mEq/L (ref 3.5–5.1)
Sodium: 136 mEq/L (ref 135–145)

## 2019-12-29 LAB — TSH: TSH: 1.98 u[IU]/mL (ref 0.35–4.50)

## 2020-01-01 ENCOUNTER — Encounter: Payer: Self-pay | Admitting: Lab

## 2020-01-21 ENCOUNTER — Other Ambulatory Visit: Payer: Self-pay | Admitting: Internal Medicine

## 2020-01-22 ENCOUNTER — Telehealth: Payer: Self-pay | Admitting: Internal Medicine

## 2020-01-22 NOTE — Telephone Encounter (Signed)
Refill request for Xanax, last seen 11-03-19, last filled 11-29-19.  Please advise.

## 2020-01-22 NOTE — Telephone Encounter (Signed)
Pt needs a refill on ALPRAZolam (XANAX) 0.5 MG tablet sent to [facility]

## 2020-01-23 MED ORDER — ALPRAZOLAM 0.5 MG PO TABS: ORAL_TABLET | ORAL | 1 refills | Status: DC

## 2020-01-23 NOTE — Telephone Encounter (Signed)
rx ok'd for xanax #45 with 1 refill.

## 2020-01-23 NOTE — Telephone Encounter (Signed)
I am ok to refill, but need to know where to send.

## 2020-01-23 NOTE — Addendum Note (Signed)
Addended by: Alisa Graff on: 01/23/2020 10:34 AM   Modules accepted: Orders

## 2020-01-23 NOTE — Telephone Encounter (Signed)
Needs to go to [facility]

## 2020-01-28 ENCOUNTER — Other Ambulatory Visit: Payer: Self-pay | Admitting: Internal Medicine

## 2020-02-01 ENCOUNTER — Telehealth: Payer: Self-pay | Admitting: Internal Medicine

## 2020-02-01 NOTE — Telephone Encounter (Signed)
Spoken to patient, she stated her BP is normal. I did suggest ED/UC for sx. Patient stated she will not go now since she is feeling better. Patient stated she had no, chest px, jaw px, arm px, SOB, and dizziness. I instructed that if sx return or worsen to go to ED, pt stated she will comply with the directions.

## 2020-02-01 NOTE — Telephone Encounter (Signed)
Agree with need for evaluation with symptoms she was describing.  Need to continue to spot check her pressure.  Let us know if any problems.

## 2020-02-01 NOTE — Telephone Encounter (Signed)
Patient stated she is fine now and will go to ED if sx return.

## 2020-02-01 NOTE — Telephone Encounter (Signed)
Pt said she is better now. BP is now 123/70. She said she feels better. She went to the bathroom and everything is now better. If it starts again than she will call us back.

## 2020-02-01 NOTE — Telephone Encounter (Signed)
Pt called in and said her blood pressure is real high. She gave me readings of 146/77, 159/90, 164/82, 170/90. She had dizziness but not at the time of the call. Transferred the call to [facility] with Print production planner.

## 2020-02-01 NOTE — Telephone Encounter (Signed)
Pt called back and now wants a call back. She wants to know what to do if it happens again.

## 2020-02-02 ENCOUNTER — Other Ambulatory Visit: Payer: Self-pay

## 2020-02-02 ENCOUNTER — Encounter: Payer: Self-pay | Admitting: Emergency Medicine

## 2020-02-02 ENCOUNTER — Ambulatory Visit
Admission: EM | Admit: 2020-02-02 | Discharge: 2020-02-02 | Disposition: A | Discharge status: Home / Self Care | Payer: Medicare Other | Attending: Family Medicine | Admitting: Family Medicine

## 2020-02-02 DIAGNOSIS — Z833 Family history of diabetes mellitus: Secondary | ICD-10-CM | POA: Insufficient documentation

## 2020-02-02 DIAGNOSIS — G629 Polyneuropathy, unspecified: Secondary | ICD-10-CM | POA: Diagnosis not present

## 2020-02-02 DIAGNOSIS — Z880 Allergy status to penicillin: Secondary | ICD-10-CM | POA: Diagnosis not present

## 2020-02-02 DIAGNOSIS — Z8249 Family history of ischemic heart disease and other diseases of the circulatory system: Secondary | ICD-10-CM | POA: Insufficient documentation

## 2020-02-02 DIAGNOSIS — Z8673 Personal history of transient ischemic attack (TIA), and cerebral infarction without residual deficits: Secondary | ICD-10-CM | POA: Diagnosis not present

## 2020-02-02 DIAGNOSIS — E871 Hypo-osmolality and hyponatremia: Secondary | ICD-10-CM | POA: Diagnosis not present

## 2020-02-02 DIAGNOSIS — Z888 Allergy status to other drugs, medicaments and biological substances status: Secondary | ICD-10-CM | POA: Insufficient documentation

## 2020-02-02 DIAGNOSIS — Z882 Allergy status to sulfonamides status: Secondary | ICD-10-CM | POA: Insufficient documentation

## 2020-02-02 DIAGNOSIS — Z7982 Long term (current) use of aspirin: Secondary | ICD-10-CM | POA: Diagnosis not present

## 2020-02-02 DIAGNOSIS — I10 Essential (primary) hypertension: Secondary | ICD-10-CM

## 2020-02-02 DIAGNOSIS — R000 Tachycardia, unspecified: Secondary | ICD-10-CM | POA: Diagnosis not present

## 2020-02-02 DIAGNOSIS — Z79899 Other long term (current) drug therapy: Secondary | ICD-10-CM | POA: Insufficient documentation

## 2020-02-02 DIAGNOSIS — K219 Gastro-esophageal reflux disease without esophagitis: Secondary | ICD-10-CM | POA: Diagnosis not present

## 2020-02-02 DIAGNOSIS — F419 Anxiety disorder, unspecified: Secondary | ICD-10-CM | POA: Insufficient documentation

## 2020-02-02 DIAGNOSIS — E7800 Pure hypercholesterolemia, unspecified: Secondary | ICD-10-CM | POA: Insufficient documentation

## 2020-02-02 LAB — URINALYSIS, COMPLETE (UACMP) WITH MICROSCOPIC
Bilirubin Urine: NEGATIVE
Glucose, UA: NEGATIVE mg/dL
Ketones, ur: NEGATIVE mg/dL
Leukocytes,Ua: NEGATIVE
Nitrite: NEGATIVE
Protein, ur: NEGATIVE mg/dL
Specific Gravity, Urine: 1.015 (ref 1.005–1.030)
pH: 6 (ref 5.0–8.0)

## 2020-02-02 LAB — BASIC METABOLIC PANEL
Anion gap: 13 (ref 5–15)
BUN: 15 mg/dL (ref 8–23)
CO2: 22 mmol/L (ref 22–32)
Calcium: 8.8 mg/dL — ABNORMAL LOW (ref 8.9–10.3)
Chloride: 96 mmol/L — ABNORMAL LOW (ref 98–111)
Creatinine, Ser: 0.72 mg/dL (ref 0.44–1.00)
GFR calc Af Amer: >60 mL/min (ref >60)
GFR calc non Af Amer: >60 mL/min (ref >60)
Glucose, Bld: 103 mg/dL — ABNORMAL HIGH (ref 70–99)
Potassium: 4.7 mmol/L (ref 3.5–5.1)
Sodium: 131 mmol/L — ABNORMAL LOW (ref 135–145)

## 2020-02-02 LAB — CBC
HCT: 41.2 % (ref 36.0–46.0)
Hemoglobin: 13.6 g/dL (ref 12.0–15.0)
MCH: 30 pg (ref 26.0–34.0)
MCHC: 33 g/dL (ref 30.0–36.0)
MCV: 90.7 fL (ref 80.0–100.0)
Platelets: 268 10*3/uL (ref 150–400)
RBC: 4.54 MIL/uL (ref 3.87–5.11)
RDW: 13.7 % (ref 11.5–15.5)
WBC: 5.9 10*3/uL (ref 4.0–10.5)
nRBC: 0 % (ref 0.0–0.2)

## 2020-02-02 NOTE — ED Provider Notes (Signed)
[facility]    CSN: [id number] Arrival date & time: 02/02/20  1340      History   Chief Complaint Chief Complaint  Patient presents with  . Hypertension    HPI Sandra Reyes is a 86 y.o. female.   Sandra Reyes presents with complaints of elevation in her BP over the past 4-5 days. She had a day where she briefly felt light headed, therefore she took her bp and noted it was elevated. She took a xanax which helped. She has had similar incidences in the past in which xanax helps lower her BP. No further light headedness. Today has noted her BP to be in the 160's/80's, stating it typically is in the 130's/60's. Yesterday she took an extra dose of her metoprolol which lowered her bp. Today has taken metoprolol, typical dose for her - 12.5mg - as well as xanax. She has checked her BP three times. Denies chest pain , palpitations, shortness of breath , headache, nausea or vomiting, diaphoresis, arm or jaw pain, swelling of extremities, weakness, confusion. History  Of embolic CVA 99991111. She has not been consistent with taking medication for cholesterol due to intolerances, per chart review. Denies any increased stress recently. She does not follow with a cardiologist.    ROS per HPI, negative if not otherwise mentioned.      Past Medical History:  Diagnosis Date  . Anxiety   . Collagen vascular disease ([place])   . Degenerative disc disease   . Depression   . GERD (gastroesophageal reflux disease)   . Hyperlipidemia   . Hypertension     Patient Active Problem List   Diagnosis Date Noted  . History of CVA (cerebrovascular accident) 08/21/2019  . Cystocele and rectocele with complete uterovaginal prolapse 04/12/2019  . Aortic atherosclerosis ([place]) 05/15/2017  . Diaphragmatic hernia 10/02/2016  . Back skin lesion 03/15/2016  . GERD (gastroesophageal reflux disease) 09/08/2015  . Hyperglycemia 09/08/2015  . Health care maintenance 03/10/2015  . Peripheral neuropathy  02/18/2014  . Vision changes 02/18/2014  . Chronic back pain 05/06/2013  . Anxiety 05/06/2013  . Essential hypertension, benign 05/04/2013  . Hypercholesterolemia 05/04/2013    Past Surgical History:  Procedure Laterality Date  . ABDOMINAL HYSTERECTOMY  1997   uterus and cervix removed, reports she still has her ovaries  . BREAST SURGERY  1960 to 1970   lumps removed  . CYSTOCELE REPAIR    . ESOPHAGOGASTRODUODENOSCOPY (EGD) WITH PROPOFOL N/A 10/03/2016   Procedure: ESOPHAGOGASTRODUODENOSCOPY (EGD) WITH PROPOFOL;  Surgeon: Manya Silvas, MD;  Location: [facility] ENDOSCOPY;  Service: Endoscopy;  Laterality: N/A;  . RECTOCELE REPAIR      OB History   No obstetric history on file.      Home Medications    Prior to Admission medications   Medication Sig Start Date End Date Taking? Authorizing Provider  ALPRAZolam Duanne Moron) 0.5 MG tablet TAKE 1/2 TABLET BY MOUTH ONCE DAILY & 1 TAB AT BEDTIME 01/23/20  Yes Einar Pheasant, MD  aspirin EC 81 MG tablet Take 81 mg by mouth daily.   Yes [provider]  clopidogrel (PLAVIX) 75 MG tablet TAKE 1 TABLET BY MOUTH  DAILY 01/22/20  Yes Einar Pheasant, MD  famotidine (PEPCID) 20 MG tablet TAKE 1 TABLET BY MOUTH  DAILY 01/29/20  Yes Einar Pheasant, MD  metoprolol succinate (TOPROL-XL) 25 MG 24 hr tablet TAKE ONE-HALF TABLET BY  MOUTH TWICE DAILY 08/08/19  Yes Einar Pheasant, MD  Multiple Vitamin (MULTIVITAMIN) tablet Take 1  tablet by mouth daily.   Yes [provider]  vitamin C (ASCORBIC ACID) 250 MG tablet Take 250 mg by mouth daily.   Yes [provider]  vitamin E 400 UNIT capsule Take 400 Units by mouth daily.   Yes [provider]  Alpha-Lipoic Acid 300 MG TABS Take 300 mg by mouth daily.    [provider]  lovastatin (MEVACOR) 10 MG tablet Take one tablet q Monday, Wednesday and friday 11/03/19   Einar Pheasant, MD    Family History Family History  Problem Relation Age of Onset  . Heart disease  Mother   . Arthritis Mother   . Ulcers Father        uremic poisoning  . Arthritis Sister        x 4  . Hyperlipidemia Sister        x 1  . Hypertension Sister   . Cancer Sister        x 2 (ovarian cancer) & 1 (colon cancer)  . Diabetes Sister   . Dementia Brother   . Dementia Brother     Social History Social History   Tobacco Use  . Smoking status: Never Smoker  . Smokeless tobacco: Never Used  Substance Use Topics  . Alcohol use: No    Alcohol/week: 0.0 standard drinks  . Drug use: No     Allergies   Celexa [citalopram], Effexor [venlafaxine], Lipitor [atorvastatin], Macrobid [nitrofurantoin macrocrystal], Nortriptyline, Penicillin v potassium, Prozac [fluoxetine], and Sulfa antibiotics   Review of Systems Review of Systems   Physical Exam Triage Vital Signs ED Triage Vitals  Enc Vitals Group     BP 02/02/20 1400 (!) 191/89     Pulse Rate 02/02/20 1400 (!) 57     Resp 02/02/20 1400 14     Temp 02/02/20 1400 98.3 F (36.8 C)     Temp Source 02/02/20 1400 Oral     SpO2 02/02/20 1400 100 %     Weight 02/02/20 1356 180 lb (81.6 kg)     Height 02/02/20 1356 5' (1.524 m)     Head Circumference --      Peak Flow --      Pain Score 02/02/20 1356 0     Pain Loc --      Pain Edu? --      Excl. in [place]? --    No data found.  Updated Vital Signs BP (!) 164/72 (BP Location: Right Arm)   Pulse 60   Temp 98.3 F (36.8 C) (Oral)   Resp 14   Ht 5' (1.524 m)   Wt 180 lb (81.6 kg)   SpO2 100%   BMI 35.15 kg/m   Visual Acuity Right Eye Distance:   Left Eye Distance:   Bilateral Distance:    Right Eye Near:   Left Eye Near:    Bilateral Near:     Physical Exam Constitutional:      General: She is not in acute distress.    Appearance: She is well-developed.  HENT:     Head: Normocephalic and atraumatic.  Eyes:     Pupils: Pupils are equal, round, and reactive to light.  Cardiovascular:     Rate and Rhythm: Regular rhythm. Bradycardia present.      Pulses: Normal pulses.  Pulmonary:     Effort: Pulmonary effort is normal.     Breath sounds: Normal breath sounds.  Musculoskeletal:     Right lower leg: No edema.  Left lower leg: No edema.  Skin:    General: Skin is warm and dry.  Neurological:     General: No focal deficit present.     Mental Status: She is alert and oriented to person, place, and time. Mental status is at baseline.  Psychiatric:        Mood and Affect: Mood normal.    EKG:  Sinus bradycardia . Previous EKG was available for review. No stwave changes as interpreted by me.    UC Treatments / Results  Labs (all labs ordered are listed, but only abnormal results are displayed) Labs Reviewed  URINALYSIS, COMPLETE (UACMP) WITH MICROSCOPIC - Abnormal; Notable for the following components:      Result Value   Color, Urine STRAW (*)    Hgb urine dipstick TRACE (*)    Bacteria, UA RARE (*)    All other components within normal limits  BASIC METABOLIC PANEL - Abnormal; Notable for the following components:   Sodium 131 (*)    Chloride 96 (*)    Glucose, Bld 103 (*)    Calcium 8.8 (*)    All other components within normal limits  URINE CULTURE  CBC    EKG   Radiology No results found.  Procedures Procedures (including critical care time)  Medications Ordered in UC Medications - No data to display  Initial Impression / Assessment and Plan / UC Course  I have reviewed the triage vital signs and the nursing notes.  Pertinent labs & imaging results that were available during my care of the patient were reviewed by me and considered in my medical decision making (see chart for details).     No red flag symptoms on arrival- no headache, vision changes, chest pain , shortness of breath, swelling. ekg without acute findings. BP with significant improvement while waiting here in UC. No proteinuria. Kidney function appears well. Noted mild hyponatremia. Glucose looks well, however. Noted mild hyponatremia  with last hospitalization as well which did resolve. Encouraged follow up with PCP for recheck of bp in the next two weeks. Strict return precautions provided. Patient verbalized understanding and agreeable to plan.    Final Clinical Impressions(s) / UC Diagnoses   Final diagnoses:  Hypertension, unspecified type  Hyponatremia     Discharge Instructions     Your blood pressure has come down very nicely here today without any additional medications, which is reassuring.  Your labs and ekg look well also.  Continue with your regular regimen of medications as already prescribed.  Please follow up with your primary care provider next week if able, or in the next two weeks, for recheck of your BP and possible lab recheck, to determine if you need additional medications.  Please go to the ER if you develop chest pain , dizziness, headache, vision changes, weakness, sweating, nausea or vomiting, or otherwise worsening.     ED Prescriptions    None     PDMP not reviewed this encounter.   Zigmund Gottron, NP 02/02/20 [phone]

## 2020-02-02 NOTE — ED Triage Notes (Signed)
Patient states that her blood pressure has been elevated over the past 3-4 days.  Patient denies chest pain or SOB.  Patient states that she takes her blood pressure at home. Last reading was around 178/80 she states.

## 2020-02-02 NOTE — Discharge Instructions (Addendum)
Your blood pressure has come down very nicely here today without any additional medications, which is reassuring.  Your labs and ekg look well also.  Continue with your regular regimen of medications as already prescribed.  Please follow up with your primary care provider next week if able, or in the next two weeks, for recheck of your BP and possible lab recheck, to determine if you need additional medications.  Please go to the ER if you develop chest pain , dizziness, headache, vision changes, weakness, sweating, nausea or vomiting, or otherwise worsening.

## 2020-02-04 LAB — URINE CULTURE: Culture: 30000 — AB

## 2020-02-06 DIAGNOSIS — I700 Atherosclerosis of aorta: Secondary | ICD-10-CM | POA: Diagnosis not present

## 2020-02-06 DIAGNOSIS — E7800 Pure hypercholesterolemia, unspecified: Secondary | ICD-10-CM | POA: Diagnosis not present

## 2020-02-06 DIAGNOSIS — Z8673 Personal history of transient ischemic attack (TIA), and cerebral infarction without residual deficits: Secondary | ICD-10-CM | POA: Diagnosis not present

## 2020-02-06 DIAGNOSIS — I10 Essential (primary) hypertension: Secondary | ICD-10-CM | POA: Diagnosis not present

## 2020-03-06 DIAGNOSIS — I10 Essential (primary) hypertension: Secondary | ICD-10-CM | POA: Diagnosis not present

## 2020-03-06 DIAGNOSIS — I700 Atherosclerosis of aorta: Secondary | ICD-10-CM | POA: Diagnosis not present

## 2020-03-06 DIAGNOSIS — E7800 Pure hypercholesterolemia, unspecified: Secondary | ICD-10-CM | POA: Diagnosis not present

## 2020-03-15 ENCOUNTER — Encounter: Payer: Medicare Other | Admitting: Internal Medicine

## 2020-03-17 ENCOUNTER — Observation Stay
Admission: EM | Admit: 2020-03-17 | Discharge: 2020-03-19 | Disposition: A | Discharge status: Home / Self Care | Payer: Medicare Other | Attending: Internal Medicine | Admitting: Internal Medicine

## 2020-03-17 ENCOUNTER — Emergency Department: Payer: Medicare Other

## 2020-03-17 ENCOUNTER — Encounter: Payer: Self-pay | Admitting: Internal Medicine

## 2020-03-17 ENCOUNTER — Other Ambulatory Visit: Payer: Self-pay

## 2020-03-17 DIAGNOSIS — F329 Major depressive disorder, single episode, unspecified: Secondary | ICD-10-CM | POA: Diagnosis not present

## 2020-03-17 DIAGNOSIS — E785 Hyperlipidemia, unspecified: Secondary | ICD-10-CM | POA: Insufficient documentation

## 2020-03-17 DIAGNOSIS — I4891 Unspecified atrial fibrillation: Principal | ICD-10-CM | POA: Diagnosis present

## 2020-03-17 DIAGNOSIS — Z8673 Personal history of transient ischemic attack (TIA), and cerebral infarction without residual deficits: Secondary | ICD-10-CM | POA: Insufficient documentation

## 2020-03-17 DIAGNOSIS — I10 Essential (primary) hypertension: Secondary | ICD-10-CM | POA: Diagnosis not present

## 2020-03-17 DIAGNOSIS — Z7982 Long term (current) use of aspirin: Secondary | ICD-10-CM | POA: Diagnosis not present

## 2020-03-17 DIAGNOSIS — R079 Chest pain, unspecified: Secondary | ICD-10-CM | POA: Diagnosis not present

## 2020-03-17 DIAGNOSIS — Z20822 Contact with and (suspected) exposure to covid-19: Secondary | ICD-10-CM | POA: Insufficient documentation

## 2020-03-17 DIAGNOSIS — Z66 Do not resuscitate: Secondary | ICD-10-CM | POA: Insufficient documentation

## 2020-03-17 DIAGNOSIS — K449 Diaphragmatic hernia without obstruction or gangrene: Secondary | ICD-10-CM | POA: Diagnosis not present

## 2020-03-17 DIAGNOSIS — Z7902 Long term (current) use of antithrombotics/antiplatelets: Secondary | ICD-10-CM | POA: Diagnosis not present

## 2020-03-17 DIAGNOSIS — Z79899 Other long term (current) drug therapy: Secondary | ICD-10-CM | POA: Diagnosis not present

## 2020-03-17 DIAGNOSIS — K219 Gastro-esophageal reflux disease without esophagitis: Secondary | ICD-10-CM | POA: Diagnosis not present

## 2020-03-17 DIAGNOSIS — F419 Anxiety disorder, unspecified: Secondary | ICD-10-CM | POA: Insufficient documentation

## 2020-03-17 DIAGNOSIS — R000 Tachycardia, unspecified: Secondary | ICD-10-CM | POA: Diagnosis not present

## 2020-03-17 LAB — CBC WITH DIFFERENTIAL/PLATELET
Abs Immature Granulocytes: 0.02 10*3/uL (ref 0.00–0.07)
Basophils Absolute: 0 10*3/uL (ref 0.0–0.1)
Basophils Relative: 1 %
Eosinophils Absolute: 0.1 10*3/uL (ref 0.0–0.5)
Eosinophils Relative: 1 %
HCT: 44.2 % (ref 36.0–46.0)
Hemoglobin: 14.7 g/dL (ref 12.0–15.0)
Immature Granulocytes: 0 %
Lymphocytes Relative: 19 %
Lymphs Abs: 1.4 10*3/uL (ref 0.7–4.0)
MCH: 30.4 pg (ref 26.0–34.0)
MCHC: 33.3 g/dL (ref 30.0–36.0)
MCV: 91.3 fL (ref 80.0–100.0)
Monocytes Absolute: 1 10*3/uL (ref 0.1–1.0)
Monocytes Relative: 14 %
Neutro Abs: 4.7 10*3/uL (ref 1.7–7.7)
Neutrophils Relative %: 65 %
Platelets: 301 10*3/uL (ref 150–400)
RBC: 4.84 MIL/uL (ref 3.87–5.11)
RDW: 13.8 % (ref 11.5–15.5)
WBC: 7.2 10*3/uL (ref 4.0–10.5)
nRBC: 0 % (ref 0.0–0.2)

## 2020-03-17 LAB — RESPIRATORY PANEL BY RT PCR (FLU A&B, COVID)
Influenza A by PCR: NEGATIVE
Influenza B by PCR: NEGATIVE
SARS Coronavirus 2 by RT PCR: NEGATIVE

## 2020-03-17 LAB — TROPONIN I (HIGH SENSITIVITY)
Troponin I (High Sensitivity): 6 ng/L (ref <18)
Troponin I (High Sensitivity): 8 ng/L (ref <18)

## 2020-03-17 LAB — COMPREHENSIVE METABOLIC PANEL
ALT: 14 U/L (ref 0–44)
AST: 22 U/L (ref 15–41)
Albumin: 4 g/dL (ref 3.5–5.0)
Alkaline Phosphatase: 60 U/L (ref 38–126)
Anion gap: 10 (ref 5–15)
BUN: 11 mg/dL (ref 8–23)
CO2: 24 mmol/L (ref 22–32)
Calcium: 8.9 mg/dL (ref 8.9–10.3)
Chloride: 99 mmol/L (ref 98–111)
Creatinine, Ser: 0.61 mg/dL (ref 0.44–1.00)
GFR calc Af Amer: >60 mL/min (ref >60)
GFR calc non Af Amer: >60 mL/min (ref >60)
Glucose, Bld: 117 mg/dL — ABNORMAL HIGH (ref 70–99)
Potassium: 4.3 mmol/L (ref 3.5–5.1)
Sodium: 133 mmol/L — ABNORMAL LOW (ref 135–145)
Total Bilirubin: 0.8 mg/dL (ref 0.3–1.2)
Total Protein: 6.9 g/dL (ref 6.5–8.1)

## 2020-03-17 LAB — T4, FREE: Free T4: 1.02 ng/dL (ref 0.61–1.12)

## 2020-03-17 LAB — BRAIN NATRIURETIC PEPTIDE: B Natriuretic Peptide: 229 pg/mL — ABNORMAL HIGH (ref 0.0–100.0)

## 2020-03-17 LAB — TSH: TSH: 2.12 u[IU]/mL (ref 0.350–4.500)

## 2020-03-17 MED ORDER — ENOXAPARIN SODIUM 80 MG/0.8ML ~~LOC~~ SOLN
80.0000 mg | Freq: Two times a day (BID) | SUBCUTANEOUS | Status: DC
  Administered 2020-03-17: 80 mg via SUBCUTANEOUS
  Filled 2020-03-17 (×2): qty 0.8

## 2020-03-17 MED ORDER — ALPHA-LIPOIC ACID 300 MG PO TABS: 300.0000 mg | ORAL_TABLET | Freq: Every day | ORAL | Status: DC

## 2020-03-17 MED ORDER — VITAMIN E 180 MG (400 UNIT) PO CAPS
400.0000 [IU] | ORAL_CAPSULE | Freq: Every day | ORAL | Status: DC
  Administered 2020-03-19: 08:00:00 400 [IU] via ORAL
  Filled 2020-03-17 (×4): qty 1

## 2020-03-17 MED ORDER — CLOPIDOGREL BISULFATE 75 MG PO TABS
75.0000 mg | ORAL_TABLET | Freq: Every day | ORAL | Status: DC
  Administered 2020-03-17 – 2020-03-19 (×3): 75 mg via ORAL
  Filled 2020-03-17 (×3): qty 1

## 2020-03-17 MED ORDER — DILTIAZEM HCL 25 MG/5ML IV SOLN
10.0000 mg | Freq: Once | INTRAVENOUS | Status: AC
  Administered 2020-03-17: 10 mg via INTRAVENOUS
  Filled 2020-03-17: qty 5

## 2020-03-17 MED ORDER — ADULT MULTIVITAMIN W/MINERALS CH
1.0000 | ORAL_TABLET | Freq: Every day | ORAL | Status: DC
  Administered 2020-03-19: 08:00:00 1 via ORAL
  Filled 2020-03-17 (×4): qty 1

## 2020-03-17 MED ORDER — DILTIAZEM HCL-DEXTROSE 125-5 MG/125ML-% IV SOLN (PREMIX)
5.0000 mg/h | INTRAVENOUS | Status: DC
  Administered 2020-03-17: 5 mg/h via INTRAVENOUS
  Filled 2020-03-17: qty 125

## 2020-03-17 MED ORDER — ALPRAZOLAM 0.25 MG PO TABS
0.2500 mg | ORAL_TABLET | Freq: Every evening | ORAL | Status: DC | PRN
  Administered 2020-03-17 – 2020-03-18 (×2): 0.25 mg via ORAL
  Filled 2020-03-17 (×2): qty 1

## 2020-03-17 MED ORDER — ACETAMINOPHEN 325 MG PO TABS: 650.0000 mg | ORAL_TABLET | ORAL | Status: DC | PRN

## 2020-03-17 MED ORDER — APIXABAN 5 MG PO TABS
5.0000 mg | ORAL_TABLET | Freq: Two times a day (BID) | ORAL | Status: DC
  Administered 2020-03-17 – 2020-03-19 (×3): 5 mg via ORAL
  Filled 2020-03-17 (×3): qty 1

## 2020-03-17 MED ORDER — ONDANSETRON HCL 4 MG/2ML IJ SOLN: 4.0000 mg | Freq: Four times a day (QID) | INTRAMUSCULAR | Status: DC | PRN

## 2020-03-17 MED ORDER — ALPRAZOLAM 0.5 MG PO TABS: 0.2500 mg | ORAL_TABLET | Freq: Two times a day (BID) | ORAL | Status: DC | PRN

## 2020-03-17 MED ORDER — FAMOTIDINE 20 MG PO TABS
20.0000 mg | ORAL_TABLET | Freq: Every day | ORAL | Status: DC
  Administered 2020-03-17 – 2020-03-19 (×2): 20 mg via ORAL
  Filled 2020-03-17 (×4): qty 1

## 2020-03-17 NOTE — ED Notes (Signed)
Pt helped to beside toilet.

## 2020-03-17 NOTE — ED Notes (Signed)
Pt assisted to toilet by this RN with one assist. Pt back in bed at this time.

## 2020-03-17 NOTE — ED Provider Notes (Signed)
[facility] Emergency Department Provider Note   ____________________________________________   First MD Initiated Contact with Patient 03/17/20 0319     (approximate)  I have reviewed the triage vital signs and the nursing notes.   HISTORY  Chief Complaint Tachycardia    HPI Sandra Reyes is a 86 y.o. female who presents to the ED from home with a chief complaint of palpitations.  Patient reports getting ready for bed around 10:30 PM and did not feel right.  Took her blood pressure which has been elevated.  States last week Dr. Bethanne Ginger office gave her losartan samples to try for elevated blood pressure.  Complains of feeling of indigestion.  Denies fever, cough, shortness of breath, abdominal pain, nausea, vomiting or dizziness.       Past Medical History:  Diagnosis Date  . Anxiety   . Collagen vascular disease ([place])   . Degenerative disc disease   . Depression   . GERD (gastroesophageal reflux disease)   . Hyperlipidemia   . Hypertension     Patient Active Problem List   Diagnosis Date Noted  . Atrial fibrillation with rapid ventricular response ([place]) 03/17/2020  . History of CVA (cerebrovascular accident) 08/21/2019  . Cystocele and rectocele with complete uterovaginal prolapse 04/12/2019  . Aortic atherosclerosis ([place]) 05/15/2017  . Diaphragmatic hernia 10/02/2016  . Back skin lesion 03/15/2016  . GERD (gastroesophageal reflux disease) 09/08/2015  . Hyperglycemia 09/08/2015  . Health care maintenance 03/10/2015  . Peripheral neuropathy 02/18/2014  . Vision changes 02/18/2014  . Chronic back pain 05/06/2013  . Anxiety 05/06/2013  . Essential hypertension, benign 05/04/2013  . Hypercholesterolemia 05/04/2013    Past Surgical History:  Procedure Laterality Date  . ABDOMINAL HYSTERECTOMY  1997   uterus and cervix removed, reports she still has her ovaries  . BREAST SURGERY  1960 to 1970   lumps removed  . CYSTOCELE REPAIR    .  ESOPHAGOGASTRODUODENOSCOPY (EGD) WITH PROPOFOL N/A 10/03/2016   Procedure: ESOPHAGOGASTRODUODENOSCOPY (EGD) WITH PROPOFOL;  Surgeon: Manya Silvas, MD;  Location: [facility] ENDOSCOPY;  Service: Endoscopy;  Laterality: N/A;  . RECTOCELE REPAIR      Prior to Admission medications   Medication Sig Start Date End Date Taking? Authorizing Provider  Alpha-Lipoic Acid 300 MG TABS Take 300 mg by mouth daily.    [provider]  ALPRAZolam Duanne Moron) 0.5 MG tablet TAKE 1/2 TABLET BY MOUTH ONCE DAILY & 1 TAB AT BEDTIME 01/23/20   Einar Pheasant, MD  aspirin EC 81 MG tablet Take 81 mg by mouth daily.    [provider]  clopidogrel (PLAVIX) 75 MG tablet TAKE 1 TABLET BY MOUTH  DAILY 01/22/20   Einar Pheasant, MD  famotidine (PEPCID) 20 MG tablet TAKE 1 TABLET BY MOUTH  DAILY 01/29/20   Einar Pheasant, MD  lovastatin (MEVACOR) 10 MG tablet Take one tablet q Monday, Wednesday and friday 11/03/19   Einar Pheasant, MD  metoprolol succinate (TOPROL-XL) 25 MG 24 hr tablet TAKE ONE-HALF TABLET BY  MOUTH TWICE DAILY 08/08/19   Einar Pheasant, MD  Multiple Vitamin (MULTIVITAMIN) tablet Take 1 tablet by mouth daily.    [provider]  vitamin C (ASCORBIC ACID) 250 MG tablet Take 250 mg by mouth daily.    [provider]  vitamin E 400 UNIT capsule Take 400 Units by mouth daily.    [provider]    Allergies Celexa [citalopram], Effexor [venlafaxine], Lipitor [atorvastatin], Macrobid [nitrofurantoin macrocrystal], Nortriptyline, Penicillin v potassium, Prozac [fluoxetine],  and Sulfa antibiotics  Family History  Problem Relation Age of Onset  . Heart disease Mother   . Arthritis Mother   . Ulcers Father        uremic poisoning  . Arthritis Sister        x 4  . Hyperlipidemia Sister        x 1  . Hypertension Sister   . Cancer Sister        x 2 (ovarian cancer) & 1 (colon cancer)  . Diabetes Sister   . Dementia Brother   . Dementia Brother     Social  History Social History   Tobacco Use  . Smoking status: Never Smoker  . Smokeless tobacco: Never Used  Substance Use Topics  . Alcohol use: No    Alcohol/week: 0.0 standard drinks  . Drug use: No    Review of Systems  Constitutional: No fever/chills Eyes: No visual changes. ENT: No sore throat. Cardiovascular: Positive for chest pain and palpitation. Respiratory: Denies shortness of breath. Gastrointestinal: No abdominal pain.  No nausea, no vomiting.  No diarrhea.  No constipation. Genitourinary: Negative for dysuria. Musculoskeletal: Negative for back pain. Skin: Negative for rash. Neurological: Negative for headaches, focal weakness or numbness.   ____________________________________________   PHYSICAL EXAM:  VITAL SIGNS: ED Triage Vitals  Enc Vitals Group     BP 03/17/20 0317 134/90     Pulse Rate 03/17/20 0317 (!) 130     Resp 03/17/20 0317 17     Temp 03/17/20 0317 98.3 F (36.8 C)     Temp Source 03/17/20 0317 Oral     SpO2 03/17/20 0317 97 %     Weight 03/17/20 0315 180 lb (81.6 kg)     Height 03/17/20 0315 5' (1.524 m)     Head Circumference --      Peak Flow --      Pain Score 03/17/20 0315 0     Pain Loc --      Pain Edu? --      Excl. in [place]? --     Constitutional: Alert and oriented. Well appearing and in mild acute distress. Eyes: Conjunctivae are normal. PERRL. EOMI. Head: Atraumatic. Nose: No congestion/rhinnorhea. Mouth/Throat: Mucous membranes are moist.   Neck: No stridor.  No thyromegaly. Cardiovascular: Tachycardic rate, irregular rhythm. Grossly normal heart sounds.  Good peripheral circulation. Respiratory: Normal respiratory effort.  No retractions. Lungs CTAB. Gastrointestinal: Soft and nontender to light or deep palpation.. No distention. No abdominal bruits. No CVA tenderness. Musculoskeletal: No lower extremity tenderness nor edema.  No joint effusions. Neurologic:  Normal speech and language. No gross focal neurologic deficits  are appreciated.  Skin:  Skin is warm, dry and intact. No rash noted. Psychiatric: Mood and affect are normal. Speech and behavior are normal.  ____________________________________________   LABS (all labs ordered are listed, but only abnormal results are displayed)  Labs Reviewed  COMPREHENSIVE METABOLIC PANEL - Abnormal; Notable for the following components:      Result Value   Sodium 133 (*)    Glucose, Bld 117 (*)    All other components within normal limits  BRAIN NATRIURETIC PEPTIDE - Abnormal; Notable for the following components:   B Natriuretic Peptide 229.0 (*)    All other components within normal limits  RESPIRATORY PANEL BY RT PCR (FLU A&B, COVID)  CBC WITH DIFFERENTIAL/PLATELET  TSH  T4, FREE  TROPONIN I (HIGH SENSITIVITY)   ____________________________________________  EKG  ED ECG REPORT I, Debbe Crumble J,  the attending physician, personally viewed and interpreted this ECG.   Date: 03/17/2020  EKG Time: 0312  Rate: 130  Rhythm: atrial fibrillation, rate 130  Axis: Normal  Intervals:none  ST&T Change: Nonspecific  ____________________________________________  RADIOLOGY  ED MD interpretation: Large hiatal hernia  Official radiology report(s): DG Chest Port 1 View  Result Date: 03/17/2020 CLINICAL DATA:  Hypertension, tachycardia, palpitations EXAM: PORTABLE CHEST 1 VIEW COMPARISON:  10/02/2016 FINDINGS: Single frontal view of the chest demonstrates stable cardiac silhouette. Large hiatal hernia unchanged. No acute airspace disease, effusion, or pneumothorax. Likely chronic compressive atelectasis at the left base. No acute bony abnormalities. IMPRESSION: 1. Stable exam, no acute process. 2. Large hiatal hernia. Electronically Signed   By: Randa Ngo M.D.   On: 03/17/2020 03:49    ____________________________________________   PROCEDURES  Procedure(s) performed (including Critical Care):  .1-3 Lead EKG Interpretation Performed by: Paulette Blanch,  MD Authorized by: Paulette Blanch, MD     Interpretation: abnormal     ECG rate:  110   ECG rate assessment: tachycardic     Rhythm: atrial fibrillation     Ectopy: none     Conduction: normal   Comments:     Patient placed on cardiac monitor to monitor for arrhythmias   CRITICAL CARE Performed by: Paulette Blanch   Total critical care time: 30 minutes  Critical care time was exclusive of separately billable procedures and treating other patients.  Critical care was necessary to treat or prevent imminent or life-threatening deterioration.  Critical care was time spent personally by me on the following activities: development of treatment plan with patient and/or surrogate as well as nursing, discussions with consultants, evaluation of patient's response to treatment, examination of patient, obtaining history from patient or surrogate, ordering and performing treatments and interventions, ordering and review of laboratory studies, ordering and review of radiographic studies, pulse oximetry and re-evaluation of patient's condition.   ____________________________________________   INITIAL IMPRESSION / ASSESSMENT AND PLAN / ED COURSE  As part of my medical decision making, I reviewed the following data within the [place] notes reviewed and incorporated, Labs reviewed, EKG interpreted, Old chart reviewed, Radiograph reviewed, Discussed with admitting physician and Notes from prior ED visits     Zanyah F Belmar was evaluated in Emergency Department on 03/17/2020 for the symptoms described in the history of present illness. She was evaluated in the context of the global COVID-19 pandemic, which necessitated consideration that the patient might be at risk for infection with the SARS-CoV-2 virus that causes COVID-19. Institutional protocols and algorithms that pertain to the evaluation of patients at risk for COVID-19 are in a state of rapid change based on information  released by regulatory bodies including the CDC and federal and state organizations. These policies and algorithms were followed during the patient's care in the ED.    86 year old female presenting with chest pressure, palpitations; A. fib with RVR on arrival.  No history of atrial fibrillation.  She is on dual antiplatelet therapy for history of CVA. Differential diagnosis includes, but is not limited to, ACS, aortic dissection, pulmonary embolism, cardiac tamponade, pneumothorax, pneumonia, pericarditis, myocarditis, GI-related causes including esophagitis/gastritis, and musculoskeletal chest wall pain.    Patient is hypertensive on arrival.  Will administer low-dose Cardizem for rate control and blood pressure.  Will check cardiac enzymes, chest x-ray.  Anticipate hospitalization for new onset atrial fibrillation with rapid ventricular rate.   Clinical Course as of Mar 18 435  Sun Mar 17, 2020  0423 Heart rate 80s, A. fib after 10 mg IV Cardizem.  Blood pressure improved.  Patient resting in no acute distress.  Will discuss with hospitalist services for admission.   [JS]    Clinical Course User Index [JS] Paulette Blanch, MD     ____________________________________________   FINAL CLINICAL IMPRESSION(S) / ED DIAGNOSES  Final diagnoses:  Atrial fibrillation with RVR [facility])  Essential hypertension     ED Discharge Orders    None       Note:  This document was prepared using Dragon voice recognition software and may include unintentional dictation errors.   Paulette Blanch, MD 03/17/20 [id number]

## 2020-03-17 NOTE — Plan of Care (Signed)
  Problem: Education: Goal: Knowledge of General Education information will improve Description: Including pain rating scale, medication(s)/side effects and non-pharmacologic comfort measures Outcome: Progressing   Problem: Clinical Measurements: Goal: Diagnostic test results will improve Outcome: Progressing Goal: Cardiovascular complication will be avoided Outcome: Progressing   

## 2020-03-17 NOTE — ED Notes (Signed)
ED Float RN has been called and is in route to transport patient to assigned room.

## 2020-03-17 NOTE — Progress Notes (Signed)
Pt admitted to unit , daughter at bedside. Pt denies any pain/discomfort at this time. cardizem 5mg  IV infusion. Vital signs stable.oriented to unit and call bell in reach.

## 2020-03-17 NOTE — Progress Notes (Addendum)
PROGRESS NOTE    Sandra Reyes  [id number] DOB: [birth year] DOA: 03/17/2020 PCP: Einar Pheasant, MD   Chief Complaint  Patient presents with  . Tachycardia   Brief narrative/ HPI 86 year old female with history of hypertension, hyperlipidemia, history of embolic stroke presented to the ED with sudden onset of palpitations.  No associated chest pain, shortness of breath, diaphoresis, nausea or vomiting. In the ED she was found to be in rapid A. fib with heart rate of 130s, blood pressure of 134/90 mmHg.  Troponin was negative and BNP of 229.  Chest x-ray without infiltrate or effusion. Blood work was unremarkable.  Given IV Cardizem bolus and started on a Cardizem drip.   Assessment & Plan:   Principal Problem: New onset atrial fibrillation with rapid ventricular response (HCC) Risk factors include hypertension, advanced age.  Heart rate in the 80s and Cardizem drip titrated down to 5 mg/h. Will wean Cardizem as tolerated and start her on p.o. Cardizem 60 mg every 6 hours.  If no improvement in persistent rapid A. fib I will consult her cardiologist (Dr. Ubaldo Glassing).  Patient is on metoprolol at home.  Will hold off on resuming it as her blood pressure is soft. Patient's CHA2DS2-VASc is 4 (Age, HTN, stroke).  Pending 2D echo.  Patient will benefit from being on anticoagulation.  Discussed all anticoagulation option with the risk versus benefit.  Patient agrees to be on NOAC.  Patient reports using a walker but denies any unsteady gait or history of falls.  Patient will be started on Eliquis.     Active Problems:  History of embolic CVA in [id number]. On aspirin and Plavix.  Since patient is being started on anticoagulation I will discontinue her aspirin and continue Plavix.  Continue statin.    Essential hypertension, benign Blood pressure is soft in the low 100s.  Continue Cardizem drip.  hold metoprolol and losartan.  GERD/hiatal hernia Continue Pepcid.    DVT prophylaxis:  Eliquis Code Status: DNR Family Communication: None Disposition: Neuro if heart rate controlled off Cardizem drip  Status is: Observation    Dispo: The patient is from: Home              Anticipated d/c is to: Home              Anticipated d/c date is: 4/19              Patient currently improving but still requiring Cardizem drip and not stable for discharge home       Consultants:   None  Procedures: 2D echo      Subjective: Reports of palpitation to be better.  Not in distress.  Objective: Vitals:   03/17/20 1030 03/17/20 1100 03/17/20 1130 03/17/20 1200  BP: (!) 92/50 111/74 113/60 101/60  Pulse: 81 83 74 81  Resp: 16 15 16 16   Temp:      TempSrc:      SpO2: 97% 96% 96% 95%  Weight:      Height:       No intake or output data in the 24 hours ending 03/17/20 1240 Filed Weights   03/17/20 0315  Weight: 81.6 kg    Examination:  Elderly female not in distress HEENT: Moist mucosa, supple neck Chest: Clear bilaterally CVS: S1-S2 regular, no murmurs GI: Soft, nondistended, nontender Musculoskeletal: Warm, no edema   Data Reviewed: I have personally reviewed following labs and imaging studies  CBC: Recent Labs  Lab 03/17/20 0331  WBC 7.2  NEUTROABS 4.7  HGB 14.7  HCT 44.2  MCV 91.3  PLT Q000111Q    Basic Metabolic Panel: Recent Labs  Lab 03/17/20 0331  NA 133*  K 4.3  CL 99  CO2 24  GLUCOSE 117*  BUN 11  CREATININE 0.61  CALCIUM 8.9    GFR: Estimated Creatinine Clearance: 46 mL/min (by C-G formula based on SCr of 0.61 mg/dL).  Liver Function Tests: Recent Labs  Lab 03/17/20 0331  AST 22  ALT 14  ALKPHOS 60  BILITOT 0.8  PROT 6.9  ALBUMIN 4.0    CBG: No results for input(s): GLUCAP in the last 168 hours.   Recent Results (from the past 240 hour(s))  Respiratory Panel by RT PCR (Flu A&B, Covid) - Nasopharyngeal Swab     Status: None   Collection Time: 03/17/20  3:31 AM   Specimen: Nasopharyngeal Swab  Result Value Ref  Range Status   SARS Coronavirus 2 by RT PCR NEGATIVE NEGATIVE Final    Comment: (NOTE) SARS-CoV-2 target nucleic acids are NOT DETECTED. The SARS-CoV-2 RNA is generally detectable in upper respiratoy specimens during the acute phase of infection. The lowest concentration of SARS-CoV-2 viral copies this assay can detect is 131 copies/mL. A negative result does not preclude SARS-Cov-2 infection and should not be used as the sole basis for treatment or other patient management decisions. A negative result may occur with  improper specimen collection/handling, submission of specimen other than nasopharyngeal swab, presence of viral mutation(s) within the areas targeted by this assay, and inadequate number of viral copies (<131 copies/mL). A negative result must be combined with clinical observations, patient history, and epidemiological information. The expected result is Negative. Fact Sheet for Patients:  PinkCheek.be Fact Sheet for Healthcare Providers:  GravelBags.it This test is not yet ap proved or cleared by the [place] FDA and  has been authorized for detection and/or diagnosis of SARS-CoV-2 by FDA under an Emergency Use Authorization (EUA). This EUA will remain  in effect (meaning this test can be used) for the duration of the COVID-19 declaration under Section 564(b)(1) of the Act, 21 U.S.C. section 360bbb-3(b)(1), unless the authorization is terminated or revoked sooner.    Influenza A by PCR NEGATIVE NEGATIVE Final   Influenza B by PCR NEGATIVE NEGATIVE Final    Comment: (NOTE) The Xpert Xpress SARS-CoV-2/FLU/RSV assay is intended as an aid in  the diagnosis of influenza from Nasopharyngeal swab specimens and  should not be used as a sole basis for treatment. Nasal washings and  aspirates are unacceptable for Xpert Xpress SARS-CoV-2/FLU/RSV  testing. Fact Sheet for  Patients: PinkCheek.be Fact Sheet for Healthcare Providers: GravelBags.it This test is not yet approved or cleared by the [place] FDA and  has been authorized for detection and/or diagnosis of SARS-CoV-2 by  FDA under an Emergency Use Authorization (EUA). This EUA will remain  in effect (meaning this test can be used) for the duration of the  Covid-19 declaration under Section 564(b)(1) of the Act, 21  U.S.C. section 360bbb-3(b)(1), unless the authorization is  terminated or revoked. Performed at [facility], [address], [place], [place] [address]          Radiology Studies: DG Chest [place] 1 View  Result Date: 03/17/2020 CLINICAL DATA:  Hypertension, tachycardia, palpitations EXAM: PORTABLE CHEST 1 VIEW COMPARISON:  10/02/2016 FINDINGS: Single frontal view of the chest demonstrates stable cardiac silhouette. Large hiatal hernia unchanged. No acute airspace disease, effusion, or pneumothorax. Likely chronic compressive atelectasis at  the left base. No acute bony abnormalities. IMPRESSION: 1. Stable exam, no acute process. 2. Large hiatal hernia. Electronically Signed   By: Randa Ngo M.D.   On: 03/17/2020 03:49        Scheduled Meds: . enoxaparin (LOVENOX) injection  80 mg Subcutaneous Q12H   Continuous Infusions: . diltiazem (CARDIZEM) infusion 5 mg/hr (03/17/20 0455)     LOS: 0 days    Time spent: [place], MD [facility]   To contact the attending provider between 7A-7P or the covering provider during after hours 7P-7A, please log into the web site [email or web address] and access using universal [place] password for that web site. If you do not have the password, please call the hospital operator.  03/17/2020, 12:40 PM

## 2020-03-17 NOTE — ED Notes (Signed)
Hospitalist MD Dhungel at patient bedside.

## 2020-03-17 NOTE — H&P (Signed)
History and Physical    Sandra Reyes [id number] DOB: [birth year] DOA: 03/17/2020  PCP: Einar Pheasant, MD   Patient coming from: Home  I have personally briefly reviewed patient's old medical records in [place]  Chief Complaint: Palpitations  HPI: Sandra Reyes is a 86 y.o. female with medical history significant for hypertension hyperlipidemia and history of embolic CVA who presents to the emergency room with sudden onset palpitations.  She had no associated chest pain or shortness of breath.  Denies nausea vomiting or diaphoresis.  Denies headache blurred vision or weakness.  ED Course: On arrival in the emergency room patient was found to be in rapid A. fib with a rate of 130.  Blood pressure 134/90.  Otherwise normal vitals.  EKG showed A. fib.  Troponin VI, BNP 229 chest x-ray showed no acute disease, did show a large hiatal.  Covid test negative.  Blood work otherwise unremarkable.  Patient was given a diltiazem bolus started on diltiazem infusion.  Hospitalist consulted for admission.  Review of Systems: As per HPI otherwise 10 point review of systems negative.    Past Medical History:  Diagnosis Date  . Anxiety   . Collagen vascular disease ([place])   . Degenerative disc disease   . Depression   . GERD (gastroesophageal reflux disease)   . Hyperlipidemia   . Hypertension     Past Surgical History:  Procedure Laterality Date  . ABDOMINAL HYSTERECTOMY  1997   uterus and cervix removed, reports she still has her ovaries  . BREAST SURGERY  1960 to 1970   lumps removed  . CYSTOCELE REPAIR    . ESOPHAGOGASTRODUODENOSCOPY (EGD) WITH PROPOFOL N/A 10/03/2016   Procedure: ESOPHAGOGASTRODUODENOSCOPY (EGD) WITH PROPOFOL;  Surgeon: Manya Silvas, MD;  Location: [facility] [place] ENDOSCOPY;  Service: Endoscopy;  Laterality: N/A;  . RECTOCELE REPAIR       reports that she has never smoked. She has never used smokeless tobacco. She reports that she does not drink alcohol or use  drugs.  Allergies  Allergen Reactions  . Celexa [Citalopram]   . Effexor [Venlafaxine]   . Lipitor [Atorvastatin]   . Macrobid [Nitrofurantoin Macrocrystal]   . Nortriptyline   . Penicillin V Potassium     Has patient had a PCN reaction causing immediate rash, facial/tongue/throat swelling, SOB or lightheadedness with hypotension: Yes Has patient had a PCN reaction causing severe rash involving mucus membranes or skin necrosis: No Has patient had a PCN reaction that required hospitalization No Has patient had a PCN reaction occurring within the last 10 years: No If all of the above answers are "NO", then may proceed with Cephalosporin use.  . Prozac [Fluoxetine]   . Sulfa Antibiotics Rash    Family History  Problem Relation Age of Onset  . Heart disease Mother   . Arthritis Mother   . Ulcers Father        uremic poisoning  . Arthritis Sister        x 4  . Hyperlipidemia Sister        x 1  . Hypertension Sister   . Cancer Sister        x 2 (ovarian cancer) & 1 (colon cancer)  . Diabetes Sister   . Dementia Brother   . Dementia Brother      Prior to Admission medications   Medication Sig Start Date End Date Taking? Authorizing Provider  Alpha-Lipoic Acid 300 MG TABS Take 300 mg by mouth daily.    [provider]  ALPRAZolam (XANAX) 0.5 MG tablet TAKE 1/2 TABLET BY MOUTH ONCE DAILY & 1 TAB AT BEDTIME 01/23/20   Einar Pheasant, MD  aspirin EC 81 MG tablet Take 81 mg by mouth daily.    [provider]  clopidogrel (PLAVIX) 75 MG tablet TAKE 1 TABLET BY MOUTH  DAILY 01/22/20   Einar Pheasant, MD  famotidine (PEPCID) 20 MG tablet TAKE 1 TABLET BY MOUTH  DAILY 01/29/20   Einar Pheasant, MD  lovastatin (MEVACOR) 10 MG tablet Take one tablet q Monday, Wednesday and friday 11/03/19   Einar Pheasant, MD  metoprolol succinate (TOPROL-XL) 25 MG 24 hr tablet TAKE ONE-HALF TABLET BY  MOUTH TWICE DAILY 08/08/19   Einar Pheasant, MD  Multiple Vitamin (MULTIVITAMIN) tablet  Take 1 tablet by mouth daily.    [provider]  vitamin C (ASCORBIC ACID) 250 MG tablet Take 250 mg by mouth daily.    [provider]  vitamin E 400 UNIT capsule Take 400 Units by mouth daily.    [provider]    Physical Exam: Vitals:   03/17/20 0315 03/17/20 0317 03/17/20 0330  BP:  134/90 (!) 157/72  Pulse:  (!) 130 (!) 55  Resp:  17 15  Temp:  98.3 F (36.8 C)   TempSrc:  Oral   SpO2:  97% 97%  Weight: 81.6 kg    Height: 5' (1.524 m)       Vitals:   03/17/20 0315 03/17/20 0317 03/17/20 0330  BP:  134/90 (!) 157/72  Pulse:  (!) 130 (!) 55  Resp:  17 15  Temp:  98.3 F (36.8 C)   TempSrc:  Oral   SpO2:  97% 97%  Weight: 81.6 kg    Height: 5' (1.524 m)      Constitutional: Alert and awake, oriented x3, not in any acute distress. Eyes: PERLA, EOMI, irises appear normal, anicteric sclera,  ENMT: external ears and nose appear normal, normal hearing             Lips appears normal, oropharynx mucosa, tongue, posterior pharynx appear normal  Neck: neck appears normal, no masses, normal ROM, no thyromegaly, no JVD  [id number] S1-S2 clear, no murmur rubs or gallops,  , no carotid bruits, pedal pulses palpable, No LE edema Respiratory:  clear to auscultation bilaterally, no wheezing, rales or rhonchi. Respiratory effort normal. No accessory muscle use.  Abdomen: soft nontender, nondistended, normal bowel sounds, no hepatosplenomegaly, no hernias Musculoskeletal: : no cyanosis, clubbing , no contractures or atrophy Neuro: Cranial nerves II-XII intact, sensation, reflexes normal, strength Psych: judgement and insight appear normal, stable mood and affect,  Skin: no rashes or lesions or ulcers, no induration or nodules   Labs on Admission: I have personally reviewed following labs and imaging studies  CBC: Recent Labs  Lab 03/17/20 0331  WBC 7.2  NEUTROABS 4.7  HGB 14.7  HCT 44.2  MCV 91.3  PLT Q000111Q   Basic Metabolic Panel: Recent  Labs  Lab 03/17/20 0331  NA 133*  K 4.3  CL 99  CO2 24  GLUCOSE 117*  BUN 11  CREATININE 0.61  CALCIUM 8.9   GFR: Estimated Creatinine Clearance: 46 mL/min (by C-G formula based on SCr of 0.61 mg/dL). Liver Function Tests: Recent Labs  Lab 03/17/20 0331  AST 22  ALT 14  ALKPHOS 60  BILITOT 0.8  PROT 6.9  ALBUMIN 4.0   No results for input(s): LIPASE, AMYLASE in the last 168 hours. No results for  input(s): AMMONIA in the last 168 hours. Coagulation Profile: No results for input(s): INR, PROTIME in the last 168 hours. Cardiac Enzymes: No results for input(s): CKTOTAL, CKMB, CKMBINDEX, TROPONINI in the last 168 hours. BNP (last 3 results) No results for input(s): PROBNP in the last 8760 hours. HbA1C: No results for input(s): HGBA1C in the last 72 hours. CBG: No results for input(s): GLUCAP in the last 168 hours. Lipid Profile: No results for input(s): CHOL, HDL, LDLCALC, TRIG, CHOLHDL, LDLDIRECT in the last 72 hours. Thyroid Function Tests: Recent Labs    03/17/20 0331  TSH 2.120  FREET4 1.02   Anemia Panel: No results for input(s): VITAMINB12, FOLATE, FERRITIN, TIBC, IRON, RETICCTPCT in the last 72 hours. Urine analysis:    Component Value Date/Time   COLORURINE STRAW (A) 02/02/2020 1448   APPEARANCEUR CLEAR 02/02/2020 1448   LABSPEC 1.015 02/02/2020 1448   PHURINE 6.0 02/02/2020 1448   GLUCOSEU NEGATIVE 02/02/2020 1448   HGBUR TRACE (A) 02/02/2020 1448   BILIRUBINUR NEGATIVE 02/02/2020 1448   KETONESUR NEGATIVE 02/02/2020 1448   PROTEINUR NEGATIVE 02/02/2020 1448   NITRITE NEGATIVE 02/02/2020 1448   LEUKOCYTESUR NEGATIVE 02/02/2020 1448    Radiological Exams on Admission: DG Chest Port 1 View  Result Date: 03/17/2020 CLINICAL DATA:  Hypertension, tachycardia, palpitations EXAM: PORTABLE CHEST 1 VIEW COMPARISON:  10/02/2016 FINDINGS: Single frontal view of the chest demonstrates stable cardiac silhouette. Large hiatal hernia unchanged. No acute  airspace disease, effusion, or pneumothorax. Likely chronic compressive atelectasis at the left base. No acute bony abnormalities. IMPRESSION: 1. Stable exam, no acute process. 2. Large hiatal hernia. Electronically Signed   By: Randa Ngo M.D.   On: 03/17/2020 03:49    EKG: Independently reviewed.   Assessment/Plan Active Problems:     Atrial fibrillation with rapid ventricular response (HCC)   New onset atrial fibrillation (HCC) -CHA2DS2-VASc score of 4 -Continue diltiazem infusion started in the emergency room -Weight-based Lovenox pending further discussion on choice of oral anticoagulants  -We will hold metoprolol for now as patient is on the.  Essential hypertension, benign -Blood pressure controlled with diltiazem infusion -Hold home metoprolol for now    History of CVA (cerebrovascular accident) -Continue aspirin  and statin -Discontinue Plavix to decrease bleeding risk as patient will be on systemic anticoagulation  Hiatal hernia, large on chest x-ray -Patient has history of surgery for large hiatal hernia -Minimally symptomatic the patient -Continue famotidine  DVT prophylaxis: Weight-based Lovenox Code Status: DNR Family Communication:  none  Disposition Plan: Back to previous home environment Consults called: none  Status:obs    Athena Masse MD [facility]     03/17/2020, 4:41 AM

## 2020-03-17 NOTE — Progress Notes (Signed)
ANTICOAGULATION CONSULT NOTE - Initial Consult  Pharmacy Consult for Apixaban Indication: atrial fibrillation  Allergies  Allergen Reactions  . Celexa [Citalopram]   . Effexor [Venlafaxine]   . Lipitor [Atorvastatin]   . Macrobid [Nitrofurantoin Macrocrystal]   . Nortriptyline   . Penicillin V Potassium     Has patient had a PCN reaction causing immediate rash, facial/tongue/throat swelling, SOB or lightheadedness with hypotension: Yes Has patient had a PCN reaction causing severe rash involving mucus membranes or skin necrosis: No Has patient had a PCN reaction that required hospitalization No Has patient had a PCN reaction occurring within the last 10 years: No If all of the above answers are "NO", then may proceed with Cephalosporin use.  . Prozac [Fluoxetine]   . Sulfa Antibiotics Rash    Patient Measurements: Height: 5' (152.4 cm) Weight: 81.6 kg (180 lb) IBW/kg (Calculated) : 45.5 Heparin Dosing Weight:    Vital Signs: Temp: 98.3 F (36.8 C) (04/18 0317) Temp Source: Oral (04/18 0317) BP: 110/60 (04/18 1400) Pulse Rate: 75 (04/18 1400)  Labs: Recent Labs    03/17/20 0331 03/17/20 0628  HGB 14.7  --   HCT 44.2  --   PLT 301  --   CREATININE 0.61  --   TROPONINIHS 6 8    Estimated Creatinine Clearance: 46 mL/min (by C-G formula based on SCr of 0.61 mg/dL).   Medical History: Past Medical History:  Diagnosis Date  . Anxiety   . Collagen vascular disease ([place])   . Degenerative disc disease   . Depression   . GERD (gastroesophageal reflux disease)   . Hyperlipidemia   . Hypertension     Assessment: Patient is a 86yo female admitted with tachycardia. Found to be in afib w/RVR. Pharmacy consulted for apixaban dosing. Patient was ordered enoxaparin 1mg /kg q12h, last dose was given ~0900.   Plan:  Will discontinue Enoxaparin. Will order Apixaban 5mg  bid to begin this evening.  Paulina Fusi, PharmD, BCPS 03/17/2020 2:34 PM

## 2020-03-17 NOTE — ED Triage Notes (Signed)
Reports blood pressure had been up yesterday and during the night heart rate was up.

## 2020-03-17 NOTE — Progress Notes (Signed)
ANTICOAGULATION CONSULT NOTE - Initial Consult  Pharmacy Consult for Lovenox Indication: atrial fibrillation  Allergies  Allergen Reactions  . Celexa [Citalopram]   . Effexor [Venlafaxine]   . Lipitor [Atorvastatin]   . Macrobid [Nitrofurantoin Macrocrystal]   . Nortriptyline   . Penicillin V Potassium     Has patient had a PCN reaction causing immediate rash, facial/tongue/throat swelling, SOB or lightheadedness with hypotension: Yes Has patient had a PCN reaction causing severe rash involving mucus membranes or skin necrosis: No Has patient had a PCN reaction that required hospitalization No Has patient had a PCN reaction occurring within the last 10 years: No If all of the above answers are "NO", then may proceed with Cephalosporin use.  . Prozac [Fluoxetine]   . Sulfa Antibiotics Rash    Patient Measurements: Height: 5' (152.4 cm) Weight: 81.6 kg (180 lb) IBW/kg (Calculated) : 45.5  Vital Signs: Temp: 98.3 F (36.8 C) (04/18 0317) Temp Source: Oral (04/18 0317) BP: 157/72 (04/18 0330) Pulse Rate: 55 (04/18 0330)  Labs: Recent Labs    03/17/20 0331  HGB 14.7  HCT 44.2  PLT 301  CREATININE 0.61  TROPONINIHS 6   Estimated Creatinine Clearance: 46 mL/min (by C-G formula based on SCr of 0.61 mg/dL).  Medical History: Past Medical History:  Diagnosis Date  . Anxiety   . Collagen vascular disease ([place])   . Degenerative disc disease   . Depression   . GERD (gastroesophageal reflux disease)   . Hyperlipidemia   . Hypertension    Medications:  (Not in a hospital admission)  Assessment: Asked to dose Lovenox for afib.  Renal fxn OK.  Goal of Therapy:  Anti-Xa level 0.6-1 units/ml 4hrs after LMWH dose given Monitor platelets by anticoagulation protocol: Yes   Plan:  Lovenox 1mg /Kg (80mg ) SQ q12hrs Monitor for s/sx bleeding complications  Hart Robinsons A 03/17/2020,5:40 AM

## 2020-03-18 ENCOUNTER — Observation Stay (HOSPITAL_BASED_OUTPATIENT_CLINIC_OR_DEPARTMENT_OTHER)
Admit: 2020-03-18 | Discharge: 2020-03-18 | Disposition: A | Discharge status: Home / Self Care | Payer: Medicare Other | Attending: Internal Medicine | Admitting: Internal Medicine

## 2020-03-18 DIAGNOSIS — I493 Ventricular premature depolarization: Secondary | ICD-10-CM | POA: Diagnosis not present

## 2020-03-18 DIAGNOSIS — I4891 Unspecified atrial fibrillation: Secondary | ICD-10-CM

## 2020-03-18 DIAGNOSIS — I10 Essential (primary) hypertension: Secondary | ICD-10-CM

## 2020-03-18 DIAGNOSIS — Z8673 Personal history of transient ischemic attack (TIA), and cerebral infarction without residual deficits: Secondary | ICD-10-CM | POA: Diagnosis not present

## 2020-03-18 LAB — BASIC METABOLIC PANEL
Anion gap: 7 (ref 5–15)
BUN: 21 mg/dL (ref 8–23)
CO2: 29 mmol/L (ref 22–32)
Calcium: 8.7 mg/dL — ABNORMAL LOW (ref 8.9–10.3)
Chloride: 98 mmol/L (ref 98–111)
Creatinine, Ser: 0.86 mg/dL (ref 0.44–1.00)
GFR calc Af Amer: >60 mL/min (ref >60)
GFR calc non Af Amer: >60 mL/min (ref >60)
Glucose, Bld: 127 mg/dL — ABNORMAL HIGH (ref 70–99)
Potassium: 4.1 mmol/L (ref 3.5–5.1)
Sodium: 134 mmol/L — ABNORMAL LOW (ref 135–145)

## 2020-03-18 LAB — CBC
HCT: 39.5 % (ref 36.0–46.0)
Hemoglobin: 13.1 g/dL (ref 12.0–15.0)
MCH: 30.3 pg (ref 26.0–34.0)
MCHC: 33.2 g/dL (ref 30.0–36.0)
MCV: 91.2 fL (ref 80.0–100.0)
Platelets: 273 10*3/uL (ref 150–400)
RBC: 4.33 MIL/uL (ref 3.87–5.11)
RDW: 14 % (ref 11.5–15.5)
WBC: 6.3 10*3/uL (ref 4.0–10.5)
nRBC: 0 % (ref 0.0–0.2)

## 2020-03-18 LAB — MAGNESIUM: Magnesium: 2 mg/dL (ref 1.7–2.4)

## 2020-03-18 LAB — ECHOCARDIOGRAM COMPLETE
Height: 60 in
Weight: 2862.4 oz

## 2020-03-18 MED ORDER — DILTIAZEM HCL 30 MG PO TABS: 30.0000 mg | ORAL_TABLET | Freq: Four times a day (QID) | ORAL | Status: DC

## 2020-03-18 MED ORDER — DILTIAZEM HCL 30 MG PO TABS
60.0000 mg | ORAL_TABLET | Freq: Four times a day (QID) | ORAL | Status: DC
  Administered 2020-03-18 – 2020-03-19 (×6): 60 mg via ORAL
  Filled 2020-03-18 (×6): qty 2

## 2020-03-18 MED ORDER — METOPROLOL SUCCINATE ER 25 MG PO TB24
25.0000 mg | ORAL_TABLET | Freq: Every day | ORAL | Status: DC
  Administered 2020-03-18 – 2020-03-19 (×2): 25 mg via ORAL
  Filled 2020-03-18 (×2): qty 1

## 2020-03-18 NOTE — Progress Notes (Signed)
*  PRELIMINARY RESULTS* Echocardiogram 2D Echocardiogram has been performed.  Sherrie Sport 03/18/2020, 1:05 PM

## 2020-03-18 NOTE — Progress Notes (Signed)
PROGRESS NOTE    Sandra Reyes  [id number] DOB: [birth year] DOA: 03/17/2020 PCP: Einar Pheasant, MD   Chief Complaint  Patient presents with  . Tachycardia   Brief narrative/ HPI 86 year old female with history of hypertension, hyperlipidemia, history of embolic stroke presented to the ED with sudden onset of palpitations.  No associated chest pain, shortness of breath, diaphoresis, nausea or vomiting. In the ED she was found to be in rapid A. fib with heart rate of 130s, blood pressure of 134/90 mmHg.  Troponin was negative and BNP of 229.  Chest x-ray without infiltrate or effusion. Blood work was unremarkable.  Given IV Cardizem bolus and started on a Cardizem drip.   Assessment & Plan:   Principal Problem: New onset atrial fibrillation with rapid ventricular response (HCC) Risk factors include hypertension, advanced age.  Heart rate in the 80s and Cardizem drip titrated down to 5 mg/h. Off Cardizem drip last night and transition to oral Cardizem 60 mg every 6 hours.  Stable on monitor today.  Will monitor on present dose and if stable today we will discharge her on long-acting Cardizem in the morning.  Blood pressure improved and will resume her Toprol XL (25 mg daily for now). Follow 2D echo.   Patient's CHA2DS2-VASc is 4 (Age, HTN, stroke).  Started on Eliquis after anticoagulation options, risk and benefit explained to the patient.  Multiple PVCs on the monitor.  Check K and mag.  TSH normal.  Active Problems:  History of embolic CVA in [id number]. On aspirin and Plavix.  Since patient is being started on anticoagulation I will discontinue her aspirin and continue Plavix.  Continue statin.    Essential hypertension, benign Home Toprol and losartan held as blood pressure was soft.  Blood pressure improved.  Patient now on oral Cardizem.  Toprol resumed at lower dose.  Continue to hold losartan.  GERD/hiatal hernia Continue Pepcid.    DVT prophylaxis: Eliquis Code  Status: DNR Family Communication: None Disposition: Home in a.m. if heart rate controlled  Status is: Observation    Dispo: The patient is from: Home              Anticipated d/c is to: Home              Anticipated d/c date is: 4/20             Patient improving but needs to be monitored for another 24 hours on current rate limiting agent for better heart rate control.  Unsafe for discharge home at present with risk for recurrent rapid A. fib.      Consultants:   None  Procedures: 2D echo      Subjective: Patient taken off Cardizem drip last night and started on oral Cardizem.  Reports palpitations are better.  Multiple PVCs on the monitor.  Objective: Vitals:   03/18/20 0355 03/18/20 0640 03/18/20 0759 03/18/20 1128  BP: (!) 103/52 110/85 (!) 127/58 118/63  Pulse: 61  61 69  Resp:   19 19  Temp:    97.6 F (36.4 C)  TempSrc:    Oral  SpO2:   98% 97%  Weight:      Height:        Intake/Output Summary (Last 24 hours) at 03/18/2020 1310 Last data filed at 03/18/2020 0940 Gross per 24 hour  Intake 573.82 ml  Output 300 ml  Net 273.82 ml   Filed Weights   03/17/20 0315 03/17/20 1539 03/18/20 0200  Weight: 81.6 kg  82.6 kg 81.1 kg   Physical exam Not in distress HEENT: Moist mucosa, supple neck Chest: Clear CVs: S1-S2 regular, no murmurs GI: Soft, nondistended nontender Musculoskeletal: Warm, no edema   Data Reviewed: I have personally reviewed following labs and imaging studies  CBC: Recent Labs  Lab 03/17/20 0331 03/18/20 0547  WBC 7.2 6.3  NEUTROABS 4.7  --   HGB 14.7 13.1  HCT 44.2 39.5  MCV 91.3 91.2  PLT 301 [id number]    Basic Metabolic Panel: Recent Labs  Lab 03/17/20 0331  NA 133*  K 4.3  CL 99  CO2 24  GLUCOSE 117*  BUN 11  CREATININE 0.61  CALCIUM 8.9    GFR: Estimated Creatinine Clearance: 45.8 mL/min (by C-G formula based on SCr of 0.61 mg/dL).  Liver Function Tests: Recent Labs  Lab 03/17/20 0331  AST 22  ALT 14   ALKPHOS 60  BILITOT 0.8  PROT 6.9  ALBUMIN 4.0    CBG: No results for input(s): GLUCAP in the last 168 hours.   Recent Results (from the past 240 hour(s))  Respiratory Panel by RT PCR (Flu A&B, Covid) - Nasopharyngeal Swab     Status: None   Collection Time: 03/17/20  3:31 AM   Specimen: Nasopharyngeal Swab  Result Value Ref Range Status   SARS Coronavirus 2 by RT PCR NEGATIVE NEGATIVE Final    Comment: (NOTE) SARS-CoV-2 target nucleic acids are NOT DETECTED. The SARS-CoV-2 RNA is generally detectable in upper respiratoy specimens during the acute phase of infection. The lowest concentration of SARS-CoV-2 viral copies this assay can detect is 131 copies/mL. A negative result does not preclude SARS-Cov-2 infection and should not be used as the sole basis for treatment or other patient management decisions. A negative result may occur with  improper specimen collection/handling, submission of specimen other than nasopharyngeal swab, presence of viral mutation(s) within the areas targeted by this assay, and inadequate number of viral copies (<131 copies/mL). A negative result must be combined with clinical observations, patient history, and epidemiological information. The expected result is Negative. Fact Sheet for Patients:  PinkCheek.be Fact Sheet for Healthcare Providers:  GravelBags.it This test is not yet ap proved or cleared by the [place] FDA and  has been authorized for detection and/or diagnosis of SARS-CoV-2 by FDA under an Emergency Use Authorization (EUA). This EUA will remain  in effect (meaning this test can be used) for the duration of the COVID-19 declaration under Section 564(b)(1) of the Act, 21 U.S.C. section 360bbb-3(b)(1), unless the authorization is terminated or revoked sooner.    Influenza A by PCR NEGATIVE NEGATIVE Final   Influenza B by PCR NEGATIVE NEGATIVE Final    Comment:  (NOTE) The Xpert Xpress SARS-CoV-2/FLU/RSV assay is intended as an aid in  the diagnosis of influenza from Nasopharyngeal swab specimens and  should not be used as a sole basis for treatment. Nasal washings and  aspirates are unacceptable for Xpert Xpress SARS-CoV-2/FLU/RSV  testing. Fact Sheet for Patients: PinkCheek.be Fact Sheet for Healthcare Providers: GravelBags.it This test is not yet approved or cleared by the [place] FDA and  has been authorized for detection and/or diagnosis of SARS-CoV-2 by  FDA under an Emergency Use Authorization (EUA). This EUA will remain  in effect (meaning this test can be used) for the duration of the  Covid-19 declaration under Section 564(b)(1) of the Act, 21  U.S.C. section 360bbb-3(b)(1), unless the authorization is  terminated or revoked. Performed at [facility], [place]  [address], [place], [place] [address]          Radiology Studies: DG [place] 1 View  Result Date: 03/17/2020 CLINICAL DATA:  Hypertension, tachycardia, palpitations EXAM: PORTABLE CHEST 1 VIEW COMPARISON:  10/02/2016 FINDINGS: Single frontal view of the chest demonstrates stable cardiac silhouette. Large hiatal hernia unchanged. No acute airspace disease, effusion, or pneumothorax. Likely chronic compressive atelectasis at the left base. No acute bony abnormalities. IMPRESSION: 1. Stable exam, no acute process. 2. Large hiatal hernia. Electronically Signed   By: Randa Ngo M.D.   On: 03/17/2020 03:49        Scheduled Meds: . Alpha-Lipoic Acid  300 mg Oral Daily  . apixaban  5 mg Oral BID  . clopidogrel  75 mg Oral Daily  . diltiazem  60 mg Oral Q6H  . famotidine  20 mg Oral Daily  . multivitamin with minerals  1 tablet Oral Daily  . vitamin E  400 Units Oral Daily   Continuous Infusions:    LOS: 0 days    Time spent: [place], MD [facility]   To  contact the attending provider between 7A-7P or the covering provider during after hours 7P-7A, please log into the web site [email or web address] and access using universal [place] password for that web site. If you do not have the password, please call the hospital operator.  03/18/2020, 1:10 PM

## 2020-03-18 NOTE — Progress Notes (Addendum)
Pt was on carrdiozem gtt running from 56-60 at this time. Pt was stable/ notify Ouma NP. Will continue to monitor.  Update 0151: NP Ouma ordered to stop cardizem at this time, Cardizen was stopped. NP was n Will continue to monitor.  Update 0215: Ouma NP transitioned pt to PO cardizem 60 mg oral every 6 hours. Will notify incoming shift. Will continue to monitor.

## 2020-03-19 ENCOUNTER — Telehealth: Payer: Self-pay | Admitting: Internal Medicine

## 2020-03-19 DIAGNOSIS — Z8673 Personal history of transient ischemic attack (TIA), and cerebral infarction without residual deficits: Secondary | ICD-10-CM

## 2020-03-19 DIAGNOSIS — I10 Essential (primary) hypertension: Secondary | ICD-10-CM | POA: Diagnosis not present

## 2020-03-19 DIAGNOSIS — I4891 Unspecified atrial fibrillation: Secondary | ICD-10-CM | POA: Diagnosis not present

## 2020-03-19 MED ORDER — APIXABAN 5 MG PO TABS: 5.0000 mg | ORAL_TABLET | Freq: Two times a day (BID) | ORAL | 1 refills | Status: DC

## 2020-03-19 MED ORDER — SODIUM CHLORIDE 0.9% FLUSH: 10.0000 mL | INTRAVENOUS | Status: DC | PRN

## 2020-03-19 MED ORDER — SODIUM CHLORIDE 0.9% FLUSH: 10.0000 mL | Freq: Two times a day (BID) | INTRAVENOUS | Status: DC

## 2020-03-19 MED ORDER — DILTIAZEM HCL ER COATED BEADS 120 MG PO CP24: 120.0000 mg | ORAL_CAPSULE | Freq: Every day | ORAL | 0 refills | Status: DC

## 2020-03-19 MED ORDER — DILTIAZEM HCL ER COATED BEADS 120 MG PO CP24: 120.0000 mg | ORAL_CAPSULE | Freq: Every day | ORAL | Status: DC

## 2020-03-19 MED ORDER — DILTIAZEM HCL ER COATED BEADS 240 MG PO CP24
240.0000 mg | ORAL_CAPSULE | Freq: Every day | ORAL | Status: DC
  Administered 2020-03-19: 08:00:00 240 mg via ORAL
  Filled 2020-03-19: qty 2
  Filled 2020-03-19: qty 1

## 2020-03-19 NOTE — Plan of Care (Signed)
  Problem: Education: Goal: Knowledge of General Education information will improve Description: Including pain rating scale, medication(s)/side effects and non-pharmacologic comfort measures Outcome: Progressing   Problem: Clinical Measurements: Goal: Cardiovascular complication will be avoided Outcome: Progressing   

## 2020-03-19 NOTE — Discharge Summary (Signed)
Physician Discharge Summary  Sandra Reyes [id number] DOB: 09-19-1930 DOA: 03/17/2020  PCP: Einar Pheasant, MD  Admit date: 03/17/2020 Discharge date: 03/19/2020  Admitted From: Home Disposition: Home  Recommendations for Outpatient Follow-up:  1. Follow up with PCP and cardiology in 2 weeks. 2.   Home Health: None Equipment/Devices none  Discharge Condition: Fair CODE STATUS: DNR Diet recommendation: Heart Healthy    Discharge Diagnoses:  Principal Problem:   Atrial fibrillation with rapid ventricular response (HCC)  Active Problems:   Essential hypertension, benign   History of CVA (cerebrovascular accident)   New onset atrial fibrillation [facility])   Brief narrative/HPI 86 year old female with history of hypertension, hyperlipidemia, history of embolic stroke presented to the ED with sudden onset of palpitations.  No associated chest pain, shortness of breath, diaphoresis, nausea or vomiting. In the ED she was found to be in rapid A. fib with heart rate of 130s, blood pressure of 134/90 mmHg.  Troponin was negative and BNP of 229.  Chest x-ray without infiltrate or effusion. Blood work was unremarkable.  Given IV Cardizem bolus and started on a Cardizem drip.  Hospital course  Principal Problem: New onset atrial fibrillation with rapid ventricular response (HCC) Risk factors include hypertension, advanced age.    Required Cardizem drip on admission, switch to oral Cardizem 60 mg every 6 hours.  Heart rate in the 50s-60s this morning and transition to Cardizem CD 120 mg daily. Continue Toprol-XL. 2D echo with normal EF and no wall motion abnormality.   Patient's CHA2DS2-VASc is 6 (Age, HTN, stroke).  Started on Eliquis after anticoagulation options, risk and benefit explained to the patient.  Patient should follow up with her cardiologist Dr. Ubaldo Glassing in 2 weeks.    Active Problems:  History of embolic CVA in [id number]. On aspirin and Plavix.  Since patient is  being started on anticoagulation and her stroke was 7 months back I will discontinue her Plavix and continue baby aspirin.  PVCs Normal K and mag.  Continue beta-blocker.    Essential hypertension, benign Blood pressure stable.  Resume home dose Toprol and losartan along with Cardizem.  Follow as outpatient.  GERD/hiatal hernia Continue Pepcid.    Procedure: 2D echo Family Communication: None Disposition: Home    Discharge Instructions  Discharge Instructions    Amb referral to [facility]   Complete by: As directed      Allergies as of 03/19/2020      Reactions   Celexa [citalopram]    Effexor [venlafaxine]    Lipitor [atorvastatin]    Macrobid [nitrofurantoin Macrocrystal]    Nortriptyline    Penicillin V Potassium    Has patient had a PCN reaction causing immediate rash, facial/tongue/throat swelling, SOB or lightheadedness with hypotension: Yes Has patient had a PCN reaction causing severe rash involving mucus membranes or skin necrosis: No Has patient had a PCN reaction that required hospitalization No Has patient had a PCN reaction occurring within the last 10 years: No If all of the above answers are "NO", then may proceed with Cephalosporin use.   Prozac [fluoxetine]    Sulfa Antibiotics Rash      Medication List    STOP taking these medications   clopidogrel 75 MG tablet Commonly known as: PLAVIX     TAKE these medications   Alpha-Lipoic Acid 300 MG Tabs Take 300 mg by mouth daily.   ALPRAZolam 0.5 MG tablet Commonly known as: XANAX TAKE 1/2 TABLET BY MOUTH ONCE DAILY & 1 TAB AT BEDTIME  apixaban 5 MG Tabs tablet Commonly known as: ELIQUIS Take 1 tablet (5 mg total) by mouth 2 (two) times daily.   aspirin EC 81 MG tablet Take 81 mg by mouth daily.   diltiazem 120 MG 24 hr capsule Commonly known as: CARDIZEM CD Take 1 capsule (120 mg total) by mouth daily. Start taking on: March 20, 2020   famotidine 20 MG tablet Commonly known as:  PEPCID TAKE 1 TABLET BY MOUTH  DAILY   losartan 25 MG tablet Commonly known as: COZAAR Take 25 mg by mouth daily.   metoprolol succinate 25 MG 24 hr tablet Commonly known as: TOPROL-XL TAKE ONE-HALF TABLET BY  MOUTH TWICE DAILY   multivitamin tablet Take 1 tablet by mouth daily.   vitamin E 180 MG (400 UNITS) capsule Take 400 Units by mouth daily.      Follow-up Information    Einar Pheasant, MD Follow up in 1 week(s).   Specialty: Internal Medicine Contact information: [id number] [place] [address] [phone]        Teodoro Spray, MD Follow up in 2 week(s).   Specialty: Cardiology Contact information: [address] [place] [address] [phone]          Allergies  Allergen Reactions  . Celexa [Citalopram]   . Effexor [Venlafaxine]   . Lipitor [Atorvastatin]   . Macrobid [Nitrofurantoin Macrocrystal]   . Nortriptyline   . Penicillin V Potassium     Has patient had a PCN reaction causing immediate rash, facial/tongue/throat swelling, SOB or lightheadedness with hypotension: Yes Has patient had a PCN reaction causing severe rash involving mucus membranes or skin necrosis: No Has patient had a PCN reaction that required hospitalization No Has patient had a PCN reaction occurring within the last 10 years: No If all of the above answers are "NO", then may proceed with Cephalosporin use.  . Prozac [Fluoxetine]   . Sulfa Antibiotics Rash      Procedures/Studies: DG Chest Port 1 View  Result Date: 03/17/2020 CLINICAL DATA:  Hypertension, tachycardia, palpitations EXAM: PORTABLE CHEST 1 VIEW COMPARISON:  10/02/2016 FINDINGS: Single frontal view of the chest demonstrates stable cardiac silhouette. Large hiatal hernia unchanged. No acute airspace disease, effusion, or pneumothorax. Likely chronic compressive atelectasis at the left base. No acute bony abnormalities. IMPRESSION: 1. Stable exam, no acute process. 2. Large  hiatal hernia. Electronically Signed   By: Randa Ngo M.D.   On: 03/17/2020 03:49   ECHOCARDIOGRAM COMPLETE  Result Date: 03/18/2020    ECHOCARDIOGRAM REPORT   Patient Name:   Sandra Reyes Date of Exam: 03/18/2020 Medical Rec #:  [id number]      Height:       60.0 in Accession #:    [id number]     Weight:       178.9 lb Date of Birth:  [birth year]       BSA:          1.780 m Patient Age:    86 years       BP:           118/63 mmHg Patient Gender: F              HR:           69 bpm. Exam Location:  [facility] Procedure: 2D Echo, Cardiac Doppler and Color Doppler Indications:     palpitations 785.1  History:         Patient has prior history of Echocardiogram examinations,  most                  recent 08/22/2019. Risk Factors:Hypertension. Anxiety.  Sonographer:     Sherrie Sport RDCS (AE) Referring Phys:  [id number] Athena Masse Diagnosing Phys: Kathlyn Sacramento MD IMPRESSIONS  1. Left ventricular ejection fraction, by estimation, is 55 to 60%. The left ventricle has normal function. The left ventricle has no regional wall motion abnormalities. There is mild left ventricular hypertrophy. Left ventricular diastolic parameters are indeterminate.  2. Right ventricular systolic function is normal. The right ventricular size is normal. There is normal pulmonary artery systolic pressure.  3. Left atrial size was mildly dilated.  4. The mitral valve is normal in structure. No evidence of mitral valve regurgitation. No evidence of mitral stenosis.  5. The aortic valve is normal in structure. Aortic valve regurgitation is not visualized. No aortic stenosis is present. FINDINGS  Left Ventricle: Left ventricular ejection fraction, by estimation, is 55 to 60%. The left ventricle has normal function. The left ventricle has no regional wall motion abnormalities. The left ventricular internal cavity size was normal in size. There is  mild left ventricular hypertrophy. Left ventricular diastolic parameters are indeterminate. Right  Ventricle: The right ventricular size is normal. No increase in right ventricular wall thickness. Right ventricular systolic function is normal. There is normal pulmonary artery systolic pressure. The tricuspid regurgitant velocity is 2.05 m/s, and  with an assumed right atrial pressure of 10 mmHg, the estimated right ventricular systolic pressure is 0000000 mmHg. Left Atrium: Left atrial size was mildly dilated. Right Atrium: Right atrial size was normal in size. Pericardium: There is no evidence of pericardial effusion. Mitral Valve: The mitral valve is normal in structure. Normal mobility of the mitral valve leaflets. No evidence of mitral valve regurgitation. No evidence of mitral valve stenosis. Tricuspid Valve: The tricuspid valve is normal in structure. Tricuspid valve regurgitation is trivial. No evidence of tricuspid stenosis. Aortic Valve: The aortic valve is normal in structure. Aortic valve regurgitation is not visualized. No aortic stenosis is present. Aortic valve mean gradient measures 2.0 mmHg. Aortic valve peak gradient measures 4.1 mmHg. Aortic valve area, by VTI measures 2.63 cm. Pulmonic Valve: The pulmonic valve was normal in structure. Pulmonic valve regurgitation is not visualized. No evidence of pulmonic stenosis. Aorta: The aortic root is normal in size and structure. Venous: The inferior vena cava was not well visualized. IAS/Shunts: No atrial level shunt detected by color flow Doppler.  LEFT VENTRICLE PLAX 2D LVIDd:         3.09 cm  Diastology LVIDs:         2.18 cm  LV e' lateral:   6.53 cm/s LV PW:         1.13 cm  LV E/e' lateral: 14.0 LV IVS:        0.92 cm  LV e' medial:    5.44 cm/s LVOT diam:     2.00 cm  LV E/e' medial:  16.9 LV SV:         54 LV SV Index:   30 LVOT Area:     3.14 cm  RIGHT VENTRICLE RV Basal diam:  2.66 cm LEFT ATRIUM             Index       RIGHT ATRIUM           Index LA diam:        2.20 cm 1.24 cm/m  RA Area:  18.70 cm LA Vol (A2C):   50.3 ml 28.25 ml/m  RA Volume:   46.60 ml  26.18 ml/m LA Vol (A4C):   90.3 ml 50.72 ml/m LA Biplane Vol: 71.2 ml 39.99 ml/m  AORTIC VALVE                   PULMONIC VALVE AV Area (Vmax):    1.96 cm    RVOT Peak grad: 4 mmHg AV Area (Vmean):   2.09 cm AV Area (VTI):     2.63 cm AV Vmax:           101.85 cm/s AV Vmean:          62.350 cm/s AV VTI:            0.204 m AV Peak Grad:      4.1 mmHg AV Mean Grad:      2.0 mmHg LVOT Vmax:         63.50 cm/s LVOT Vmean:        41.400 cm/s LVOT VTI:          0.171 m LVOT/AV VTI ratio: 0.84  AORTA Ao Root diam: 2.30 cm MITRAL VALVE               TRICUSPID VALVE MV Area (PHT): 3.00 cm    TR Peak grad:   16.8 mmHg MV Decel Time: 253 msec    TR Vmax:        205.00 cm/s MV E velocity: 91.70 cm/s MV A velocity: 75.20 cm/s  SHUNTS MV E/A ratio:  1.22        Systemic VTI:  0.17 m                            Systemic Diam: 2.00 cm Kathlyn Sacramento MD Electronically signed by Kathlyn Sacramento MD Signature Date/Time: 03/18/2020/2:28:01 PM    Final       Subjective: Continues to feel better.  Heart rate stable on the monitor.  Discharge Exam: Vitals:   03/19/20 0729 03/19/20 1125  BP: (!) 128/52 117/68  Pulse: 60 (!) 55  Resp: 17 17  Temp: (!) 97.4 F (36.3 C) 97.7 F (36.5 C)  SpO2: 96% 99%   Vitals:   03/18/20 1940 03/19/20 0355 03/19/20 0729 03/19/20 1125  BP: 131/76 133/64 (!) 128/52 117/68  Pulse: 76 63 60 (!) 55  Resp: 19 18 17 17   Temp: 97.9 F (36.6 C) (!) 97.4 F (36.3 C) (!) 97.4 F (36.3 C) 97.7 F (36.5 C)  TempSrc: Oral Oral Oral Oral  SpO2: 96% 96% 96% 99%  Weight:  81.9 kg    Height:        General: Elderly female not in distress HEENT: Moist mucosa, supple neck Chest: Clear bilaterally CVs: S1-S2 regular, no murmurs GI: Soft, nondistended, nontender Musculoskeletal: Warm, no edema    The results of significant diagnostics from this hospitalization (including imaging, microbiology, ancillary and laboratory) are listed below for reference.      Microbiology: Recent Results (from the past 240 hour(s))  Respiratory Panel by RT PCR (Flu A&B, Covid) - Nasopharyngeal Swab     Status: None   Collection Time: 03/17/20  3:31 AM   Specimen: Nasopharyngeal Swab  Result Value Ref Range Status   SARS Coronavirus 2 by RT PCR NEGATIVE NEGATIVE Final    Comment: (NOTE) SARS-CoV-2 target nucleic acids are NOT DETECTED. The SARS-CoV-2 RNA is generally detectable in upper respiratoy specimens  during the acute phase of infection. The lowest concentration of SARS-CoV-2 viral copies this assay can detect is 131 copies/mL. A negative result does not preclude SARS-Cov-2 infection and should not be used as the sole basis for treatment or other patient management decisions. A negative result may occur with  improper specimen collection/handling, submission of specimen other than nasopharyngeal swab, presence of viral mutation(s) within the areas targeted by this assay, and inadequate number of viral copies (<131 copies/mL). A negative result must be combined with clinical observations, patient history, and epidemiological information. The expected result is Negative. Fact Sheet for Patients:  PinkCheek.be Fact Sheet for Healthcare Providers:  GravelBags.it This test is not yet ap proved or cleared by the [place] FDA and  has been authorized for detection and/or diagnosis of SARS-CoV-2 by FDA under an Emergency Use Authorization (EUA). This EUA will remain  in effect (meaning this test can be used) for the duration of the COVID-19 declaration under Section 564(b)(1) of the Act, 21 U.S.C. section 360bbb-3(b)(1), unless the authorization is terminated or revoked sooner.    Influenza A by PCR NEGATIVE NEGATIVE Final   Influenza B by PCR NEGATIVE NEGATIVE Final    Comment: (NOTE) The Xpert Xpress SARS-CoV-2/FLU/RSV assay is intended as an aid in  the diagnosis of influenza from  Nasopharyngeal swab specimens and  should not be used as a sole basis for treatment. Nasal washings and  aspirates are unacceptable for Xpert Xpress SARS-CoV-2/FLU/RSV  testing. Fact Sheet for Patients: PinkCheek.be Fact Sheet for Healthcare Providers: GravelBags.it This test is not yet approved or cleared by the [place] FDA and  has been authorized for detection and/or diagnosis of SARS-CoV-2 by  FDA under an Emergency Use Authorization (EUA). This EUA will remain  in effect (meaning this test can be used) for the duration of the  Covid-19 declaration under Section 564(b)(1) of the Act, 21  U.S.C. section 360bbb-3(b)(1), unless the authorization is  terminated or revoked. Performed at [facility], [address] [place], [place] [address]      Labs: BNP (last 3 results) Recent Labs    03/17/20 0331  BNP 99991111*   Basic Metabolic Panel: Recent Labs  Lab 03/17/20 0331 03/18/20 1347  NA 133* 134*  K 4.3 4.1  CL 99 98  CO2 24 29  GLUCOSE 117* 127*  BUN 11 21  CREATININE 0.61 0.86  CALCIUM 8.9 8.7*  MG  --  2.0   Liver Function Tests: Recent Labs  Lab 03/17/20 0331  AST 22  ALT 14  ALKPHOS 60  BILITOT 0.8  PROT 6.9  ALBUMIN 4.0   No results for input(s): LIPASE, AMYLASE in the last 168 hours. No results for input(s): AMMONIA in the last 168 hours. CBC: Recent Labs  Lab 03/17/20 0331 03/18/20 0547  WBC 7.2 6.3  NEUTROABS 4.7  --   HGB 14.7 13.1  HCT 44.2 39.5  MCV 91.3 91.2  PLT 301 273   Cardiac Enzymes: No results for input(s): CKTOTAL, CKMB, CKMBINDEX, TROPONINI in the last 168 hours. BNP: Invalid input(s): POCBNP CBG: No results for input(s): GLUCAP in the last 168 hours. D-Dimer No results for input(s): DDIMER in the last 72 hours. Hgb A1c No results for input(s): HGBA1C in the last 72 hours. Lipid Profile No results for input(s): CHOL, HDL, LDLCALC, TRIG, CHOLHDL,  LDLDIRECT in the last 72 hours. Thyroid function studies Recent Labs    03/17/20 0331  TSH 2.120   Anemia work up No results  for input(s): VITAMINB12, FOLATE, FERRITIN, TIBC, IRON, RETICCTPCT in the last 72 hours. Urinalysis    Component Value Date/Time   COLORURINE STRAW (A) 02/02/2020 1448   APPEARANCEUR CLEAR 02/02/2020 1448   LABSPEC 1.015 02/02/2020 1448   PHURINE 6.0 02/02/2020 1448   GLUCOSEU NEGATIVE 02/02/2020 1448   HGBUR TRACE (A) 02/02/2020 1448   BILIRUBINUR NEGATIVE 02/02/2020 1448   KETONESUR NEGATIVE 02/02/2020 1448   PROTEINUR NEGATIVE 02/02/2020 1448   NITRITE NEGATIVE 02/02/2020 1448   LEUKOCYTESUR NEGATIVE 02/02/2020 1448   Sepsis Labs Invalid input(s): PROCALCITONIN,  WBC,  LACTICIDVEN Microbiology Recent Results (from the past 240 hour(s))  Respiratory Panel by RT PCR (Flu A&B, Covid) - Nasopharyngeal Swab     Status: None   Collection Time: 03/17/20  3:31 AM   Specimen: Nasopharyngeal Swab  Result Value Ref Range Status   SARS Coronavirus 2 by RT PCR NEGATIVE NEGATIVE Final    Comment: (NOTE) SARS-CoV-2 target nucleic acids are NOT DETECTED. The SARS-CoV-2 RNA is generally detectable in upper respiratoy specimens during the acute phase of infection. The lowest concentration of SARS-CoV-2 viral copies this assay can detect is 131 copies/mL. A negative result does not preclude SARS-Cov-2 infection and should not be used as the sole basis for treatment or other patient management decisions. A negative result may occur with  improper specimen collection/handling, submission of specimen other than nasopharyngeal swab, presence of viral mutation(s) within the areas targeted by this assay, and inadequate number of viral copies (<131 copies/mL). A negative result must be combined with clinical observations, patient history, and epidemiological information. The expected result is Negative. Fact Sheet for Patients:   PinkCheek.be Fact Sheet for Healthcare Providers:  GravelBags.it This test is not yet ap proved or cleared by the [place] FDA and  has been authorized for detection and/or diagnosis of SARS-CoV-2 by FDA under an Emergency Use Authorization (EUA). This EUA will remain  in effect (meaning this test can be used) for the duration of the COVID-19 declaration under Section 564(b)(1) of the Act, 21 U.S.C. section 360bbb-3(b)(1), unless the authorization is terminated or revoked sooner.    Influenza A by PCR NEGATIVE NEGATIVE Final   Influenza B by PCR NEGATIVE NEGATIVE Final    Comment: (NOTE) The Xpert Xpress SARS-CoV-2/FLU/RSV assay is intended as an aid in  the diagnosis of influenza from Nasopharyngeal swab specimens and  should not be used as a sole basis for treatment. Nasal washings and  aspirates are unacceptable for Xpert Xpress SARS-CoV-2/FLU/RSV  testing. Fact Sheet for Patients: PinkCheek.be Fact Sheet for Healthcare Providers: GravelBags.it This test is not yet approved or cleared by the [place] FDA and  has been authorized for detection and/or diagnosis of SARS-CoV-2 by  FDA under an Emergency Use Authorization (EUA). This EUA will remain  in effect (meaning this test can be used) for the duration of the  Covid-19 declaration under Section 564(b)(1) of the Act, 21  U.S.C. section 360bbb-3(b)(1), unless the authorization is  terminated or revoked. Performed at [facility], [address], [place], [place] [address]      Time coordinating discharge: 25 minutes  SIGNED:   Louellen Molder, MD  [facility] 03/19/2020, 12:57 PM Pager   If 7PM-7AM, please contact night-coverage [email or web address] Password [email or web address]

## 2020-03-19 NOTE — Telephone Encounter (Signed)
[facility] called to schedule a HFU  appt scheduled for 03/27/20

## 2020-03-19 NOTE — Discharge Instructions (Signed)

## 2020-03-21 ENCOUNTER — Telehealth: Payer: Self-pay

## 2020-03-21 NOTE — Telephone Encounter (Signed)
Patient is scheduled a hfu with Cardiology on 03/26/20 and has requested to stay home for a telephone visit with PMD 03/27/20. Reports she is doing well and back to baseline. Denies chest pain, SOB, nausea, vomiting and all other symptoms. States it is a long drive from her home and daughter already has to take off work. Appointment moved from office visit to telephone visit per patient preference. Transportation referral declined.

## 2020-03-26 DIAGNOSIS — I700 Atherosclerosis of aorta: Secondary | ICD-10-CM | POA: Diagnosis not present

## 2020-03-26 DIAGNOSIS — I10 Essential (primary) hypertension: Secondary | ICD-10-CM | POA: Diagnosis not present

## 2020-03-26 DIAGNOSIS — Z8673 Personal history of transient ischemic attack (TIA), and cerebral infarction without residual deficits: Secondary | ICD-10-CM | POA: Diagnosis not present

## 2020-03-26 DIAGNOSIS — I4891 Unspecified atrial fibrillation: Secondary | ICD-10-CM | POA: Diagnosis not present

## 2020-03-26 DIAGNOSIS — E7800 Pure hypercholesterolemia, unspecified: Secondary | ICD-10-CM | POA: Diagnosis not present

## 2020-03-27 ENCOUNTER — Telehealth (INDEPENDENT_AMBULATORY_CARE_PROVIDER_SITE_OTHER): Payer: Medicare Other | Admitting: Internal Medicine

## 2020-03-27 DIAGNOSIS — Z8673 Personal history of transient ischemic attack (TIA), and cerebral infarction without residual deficits: Secondary | ICD-10-CM

## 2020-03-27 DIAGNOSIS — E7800 Pure hypercholesterolemia, unspecified: Secondary | ICD-10-CM

## 2020-03-27 DIAGNOSIS — I10 Essential (primary) hypertension: Secondary | ICD-10-CM | POA: Diagnosis not present

## 2020-03-27 DIAGNOSIS — R739 Hyperglycemia, unspecified: Secondary | ICD-10-CM

## 2020-03-27 DIAGNOSIS — I4891 Unspecified atrial fibrillation: Secondary | ICD-10-CM

## 2020-03-27 DIAGNOSIS — I700 Atherosclerosis of aorta: Secondary | ICD-10-CM

## 2020-03-27 NOTE — Progress Notes (Deleted)
[name], female   DOB: 06-Sep-1930, 86 y.o.   MRN: [id number]   Subjective:    [name], female    DOB: [birth year], 86 y.o.   MRN: [id number]  HPI This visit occurred during the SARS-CoV-2 public health emergency.  Safety protocols were in place, including screening questions prior to the visit, additional usage of staff PPE, and extensive cleaning of exam room while observing appropriate contact time as indicated for disinfecting solutions.  Patient here for ***  Past Medical History:  Diagnosis Date  . Anxiety   . Collagen vascular disease ([place])   . Degenerative disc disease   . Depression   . GERD (gastroesophageal reflux disease)   . Hyperlipidemia   . Hypertension    Past Surgical History:  Procedure Laterality Date  . ABDOMINAL HYSTERECTOMY  1997   uterus and cervix removed, reports she still has her ovaries  . BREAST SURGERY  1960 to 1970   lumps removed  . CYSTOCELE REPAIR    . ESOPHAGOGASTRODUODENOSCOPY (EGD) WITH PROPOFOL N/A 10/03/2016   Procedure: ESOPHAGOGASTRODUODENOSCOPY (EGD) WITH PROPOFOL;  Surgeon: Manya Silvas, MD;  Location: [facility] ENDOSCOPY;  Service: Endoscopy;  Laterality: N/A;  . RECTOCELE REPAIR     Family History  Problem Relation Age of Onset  . Heart disease Mother   . Arthritis Mother   . Ulcers Father        uremic poisoning  . Arthritis Sister        x 4  . Hyperlipidemia Sister        x 1  . Hypertension Sister   . Cancer Sister        x 2 (ovarian cancer) & 1 (colon cancer)  . Diabetes Sister   . Dementia Brother   . Dementia Brother    Social History   Socioeconomic History  . Marital status: Widowed    Spouse name: Not on file  . Number of children: Not on file  . Years of education: Not on file  . Highest education level: Not on file  Occupational History  . Not on file  Tobacco Use  . Smoking status: Never Smoker  . Smokeless tobacco: Never Used  Substance and Sexual Activity  . Alcohol  use: No    Alcohol/week: 0.0 standard drinks  . Drug use: No  . Sexual activity: Not Currently    Birth control/protection: None  Other Topics Concern  . Not on file  Social History Narrative  . Not on file   Social Determinants of Health   Financial Resource Strain: Low Risk   . Difficulty of Paying Living Expenses: Not hard at all  Food Insecurity: No Food Insecurity  . Worried About Charity fundraiser in the Last Year: Never true  . Ran Out of Food in the Last Year: Never true  Transportation Needs: No Transportation Needs  . Lack of Transportation (Medical): No  . Lack of Transportation (Non-Medical): No  Physical Activity:   . Days of Exercise per Week:   . Minutes of Exercise per Session:   Stress: No Stress Concern Present  . Feeling of Stress : Not at all  Social Connections:   . Frequency of Communication with Friends and Family:   . Frequency of Social Gatherings with Friends and Family:   . Attends Religious Services:   . Active Member of Clubs or Organizations:   . Attends Archivist Meetings:   Marland Kitchen Marital Status:  Outpatient Encounter Medications as of 03/27/2020  Medication Sig  . Alpha-Lipoic Acid 300 MG TABS Take 300 mg by mouth daily.  Marland Kitchen ALPRAZolam (XANAX) 0.5 MG tablet TAKE 1/2 TABLET BY MOUTH ONCE DAILY & 1 TAB AT BEDTIME  . apixaban (ELIQUIS) 5 MG TABS tablet Take 1 tablet (5 mg total) by mouth 2 (two) times daily.  Marland Kitchen aspirin EC 81 MG tablet Take 81 mg by mouth daily.  Marland Kitchen diltiazem (CARDIZEM CD) 120 MG 24 hr capsule Take 1 capsule (120 mg total) by mouth daily.  . famotidine (PEPCID) 20 MG tablet TAKE 1 TABLET BY MOUTH  DAILY  . losartan (COZAAR) 25 MG tablet Take 25 mg by mouth daily.  . metoprolol succinate (TOPROL-XL) 25 MG 24 hr tablet TAKE ONE-HALF TABLET BY  MOUTH TWICE DAILY  . Multiple Vitamin (MULTIVITAMIN) tablet Take 1 tablet by mouth daily.  . vitamin E 400 UNIT capsule Take 400 Units by mouth daily.   No facility-administered  encounter medications on file as of 03/27/2020.    Review of Systems     Objective:    Physical Exam  BP 117/61   Pulse 63   Ht 5' (1.524 m)   Wt 180 lb (81.6 kg)   BMI 35.15 kg/m  Wt Readings from Last 3 Encounters:  03/27/20 180 lb (81.6 kg)  03/19/20 180 lb 9.6 oz (81.9 kg)  02/02/20 180 lb (81.6 kg)     Lab Results  Component Value Date   WBC 6.3 03/18/2020   HGB 13.1 03/18/2020   HCT 39.5 03/18/2020   PLT 273 03/18/2020   GLUCOSE 127 (H) 03/18/2020   CHOL 229 (H) 12/29/2019   TRIG 102.0 12/29/2019   HDL 50.80 12/29/2019   LDLCALC 157 (H) 12/29/2019   ALT 14 03/17/2020   AST 22 03/17/2020   NA 134 (L) 03/18/2020   K 4.1 03/18/2020   CL 98 03/18/2020   CREATININE 0.86 03/18/2020   BUN 21 03/18/2020   CO2 29 03/18/2020   TSH 2.120 03/17/2020   HGBA1C 6.0 12/29/2019    ECHOCARDIOGRAM COMPLETE  Result Date: 03/18/2020    ECHOCARDIOGRAM REPORT   Patient Name:   Sandra Reyes Date of Exam: 03/18/2020 Medical Rec #:  [id number]      Height:       60.0 in Accession #:    [id number]     Weight:       178.9 lb Date of Birth:  10/05/1930       BSA:          1.780 m Patient Age:    24 years       BP:           118/63 mmHg Patient Gender: F              HR:           69 bpm. Exam Location:  [facility] Procedure: 2D Echo, Cardiac Doppler and Color Doppler Indications:     palpitations 785.1  History:         Patient has prior history of Echocardiogram examinations, most                  recent 08/22/2019. Risk Factors:Hypertension. Anxiety.  Sonographer:     Sherrie Sport RDCS (AE) Referring Phys:  [id number] Athena Masse Diagnosing Phys: Kathlyn Sacramento MD IMPRESSIONS  1. Left ventricular ejection fraction, by estimation, is 55 to 60%. The left ventricle has normal function. The left ventricle has no  regional wall motion abnormalities. There is mild left ventricular hypertrophy. Left ventricular diastolic parameters are indeterminate.  2. Right ventricular systolic function is normal. The  right ventricular size is normal. There is normal pulmonary artery systolic pressure.  3. Left atrial size was mildly dilated.  4. The mitral valve is normal in structure. No evidence of mitral valve regurgitation. No evidence of mitral stenosis.  5. The aortic valve is normal in structure. Aortic valve regurgitation is not visualized. No aortic stenosis is present. FINDINGS  Left Ventricle: Left ventricular ejection fraction, by estimation, is 55 to 60%. The left ventricle has normal function. The left ventricle has no regional wall motion abnormalities. The left ventricular internal cavity size was normal in size. There is  mild left ventricular hypertrophy. Left ventricular diastolic parameters are indeterminate. Right Ventricle: The right ventricular size is normal. No increase in right ventricular wall thickness. Right ventricular systolic function is normal. There is normal pulmonary artery systolic pressure. The tricuspid regurgitant velocity is 2.05 m/s, and  with an assumed right atrial pressure of 10 mmHg, the estimated right ventricular systolic pressure is 0000000 mmHg. Left Atrium: Left atrial size was mildly dilated. Right Atrium: Right atrial size was normal in size. Pericardium: There is no evidence of pericardial effusion. Mitral Valve: The mitral valve is normal in structure. Normal mobility of the mitral valve leaflets. No evidence of mitral valve regurgitation. No evidence of mitral valve stenosis. Tricuspid Valve: The tricuspid valve is normal in structure. Tricuspid valve regurgitation is trivial. No evidence of tricuspid stenosis. Aortic Valve: The aortic valve is normal in structure. Aortic valve regurgitation is not visualized. No aortic stenosis is present. Aortic valve mean gradient measures 2.0 mmHg. Aortic valve peak gradient measures 4.1 mmHg. Aortic valve area, by VTI measures 2.63 cm. Pulmonic Valve: The pulmonic valve was normal in structure. Pulmonic valve regurgitation is not  visualized. No evidence of pulmonic stenosis. Aorta: The aortic root is normal in size and structure. Venous: The inferior vena cava was not well visualized. IAS/Shunts: No atrial level shunt detected by color flow Doppler.  LEFT VENTRICLE PLAX 2D LVIDd:         3.09 cm  Diastology LVIDs:         2.18 cm  LV e' lateral:   6.53 cm/s LV PW:         1.13 cm  LV E/e' lateral: 14.0 LV IVS:        0.92 cm  LV e' medial:    5.44 cm/s LVOT diam:     2.00 cm  LV E/e' medial:  16.9 LV SV:         54 LV SV Index:   30 LVOT Area:     3.14 cm  RIGHT VENTRICLE RV Basal diam:  2.66 cm LEFT ATRIUM             Index       RIGHT ATRIUM           Index LA diam:        2.20 cm 1.24 cm/m  RA Area:     18.70 cm LA Vol (A2C):   50.3 ml 28.25 ml/m RA Volume:   46.60 ml  26.18 ml/m LA Vol (A4C):   90.3 ml 50.72 ml/m LA Biplane Vol: 71.2 ml 39.99 ml/m  AORTIC VALVE                   PULMONIC VALVE AV Area (Vmax):    1.96 cm  RVOT Peak grad: 4 mmHg AV Area (Vmean):   2.09 cm AV Area (VTI):     2.63 cm AV Vmax:           101.85 cm/s AV Vmean:          62.350 cm/s AV VTI:            0.204 m AV Peak Grad:      4.1 mmHg AV Mean Grad:      2.0 mmHg LVOT Vmax:         63.50 cm/s LVOT Vmean:        41.400 cm/s LVOT VTI:          0.171 m LVOT/AV VTI ratio: 0.84  AORTA Ao Root diam: 2.30 cm MITRAL VALVE               TRICUSPID VALVE MV Area (PHT): 3.00 cm    TR Peak grad:   16.8 mmHg MV Decel Time: 253 msec    TR Vmax:        205.00 cm/s MV E velocity: 91.70 cm/s MV A velocity: 75.20 cm/s  SHUNTS MV E/A ratio:  1.22        Systemic VTI:  0.17 m                            Systemic Diam: 2.00 cm Kathlyn Sacramento MD Electronically signed by Kathlyn Sacramento MD Signature Date/Time: 03/18/2020/2:28:01 PM    Final        Assessment & Plan:   Problem List Items Addressed This Visit    None       Einar Pheasant, MD

## 2020-03-31 ENCOUNTER — Encounter: Payer: Self-pay | Admitting: Internal Medicine

## 2020-03-31 NOTE — Assessment & Plan Note (Signed)
Blood pressure has been ok on cardizem, metoprolol and losartan.  Follow pressures.  Follow metabolic panel.

## 2020-03-31 NOTE — Progress Notes (Signed)
[name], female   DOB: [birth year], 86 y.o.   MRN: [id number]   Virtual Visit via telephone Note  This visit type was conducted due to national recommendations for restrictions regarding the COVID-19 pandemic (e.g. social distancing).  This format is felt to be most appropriate for this patient at this time.  All issues noted in this document were discussed and addressed.  No physical exam was performed (except for noted visual exam findings with Video Visits).   I connected with Chanin Bonsell by telephone and verified that I am speaking with the correct person using two identifiers. Location patient: home Location provider: work  Persons participating in the telephone visit: patient, provider  The limitations, risks, security and privacy concerns of performing an evaluation and management service by telephone and the availability of in person appointments have been discussed. The patient expressed understanding and agreed to proceed.   Reason for visit: hospital follow up.    HPI: She was recently admitted with palpitations.  Found to have new onset of afib.  Was placed on cardizem drip and changed to oral cardizem prior to discharge.  Also started on eliquis.  Since discharge, she has been doing well.  Denies any chest pain or sob.  No acid reflux.  No increased heart rate or palpitations.  Able to do her ADLs.  Mopping, cleaning, etc.  Eating.  Bowels stable.  Just saw cardiology yesterday.  Felt stable.  No changes made.     ROS: See pertinent positives and negatives per HPI.  Past Medical History:  Diagnosis Date  . Anxiety   . Collagen vascular disease ([place])   . Degenerative disc disease   . Depression   . GERD (gastroesophageal reflux disease)   . Hyperlipidemia   . Hypertension     Past Surgical History:  Procedure Laterality Date  . ABDOMINAL HYSTERECTOMY  1997   uterus and cervix removed, reports she still has her ovaries  . BREAST SURGERY  1960 to 1970     lumps removed  . CYSTOCELE REPAIR    . ESOPHAGOGASTRODUODENOSCOPY (EGD) WITH PROPOFOL N/A 10/03/2016   Procedure: ESOPHAGOGASTRODUODENOSCOPY (EGD) WITH PROPOFOL;  Surgeon: Manya Silvas, MD;  Location: [facility] ENDOSCOPY;  Service: Endoscopy;  Laterality: N/A;  . RECTOCELE REPAIR      Family History  Problem Relation Age of Onset  . Heart disease Mother   . Arthritis Mother   . Ulcers Father        uremic poisoning  . Arthritis Sister        x 4  . Hyperlipidemia Sister        x 1  . Hypertension Sister   . Cancer Sister        x 2 (ovarian cancer) & 1 (colon cancer)  . Diabetes Sister   . Dementia Brother   . Dementia Brother     SOCIAL HX: reviewed.    Current Outpatient Medications:  .  Alpha-Lipoic Acid 300 MG TABS, Take 300 mg by mouth daily., Disp: , Rfl:  .  ALPRAZolam (XANAX) 0.5 MG tablet, TAKE 1/2 TABLET BY MOUTH ONCE DAILY & 1 TAB AT BEDTIME, Disp: 45 tablet, Rfl: 1 .  apixaban (ELIQUIS) 5 MG TABS tablet, Take 1 tablet (5 mg total) by mouth 2 (two) times daily., Disp: 60 tablet, Rfl: 1 .  aspirin EC 81 MG tablet, Take 81 mg by mouth daily., Disp: , Rfl:  .  diltiazem (CARDIZEM CD) 120 MG 24 hr capsule, Take  1 capsule (120 mg total) by mouth daily., Disp: 30 capsule, Rfl: 0 .  famotidine (PEPCID) 20 MG tablet, TAKE 1 TABLET BY MOUTH  DAILY, Disp: 90 tablet, Rfl: 3 .  losartan (COZAAR) 25 MG tablet, Take 25 mg by mouth daily., Disp: , Rfl:  .  metoprolol succinate (TOPROL-XL) 25 MG 24 hr tablet, TAKE ONE-HALF TABLET BY  MOUTH TWICE DAILY, Disp: 90 tablet, Rfl: 3 .  Multiple Vitamin (MULTIVITAMIN) tablet, Take 1 tablet by mouth daily., Disp: , Rfl:  .  vitamin E 400 UNIT capsule, Take 400 Units by mouth daily., Disp: , Rfl:   EXAM:  GENERAL: alert.  Sounds to be in no acute distress.  Answering questions appropriately.    PSYCH/NEURO: pleasant and cooperative, no obvious depression or anxiety, speech and thought processing grossly intact  ASSESSMENT AND  PLAN:  Discussed the following assessment and plan:  Rapid atrial fibrillation [facility]) Recently admitted with afib.  Placed initially on cardizem drip.  Converted to oral cardizem.  Since discharge has done well on oral cardizem, metoprolol, eliquis and aspirin.  Saw cardiology.  Per note, appeared to be in SR.  Follow.    Hyperglycemia Low carb diet and exercise.  Follow met b and a1c.   Hypercholesterolemia Intolerant to statin medication.  Low cholesterol diet and exercise.  Follow lipid panel.   History of CVA (cerebrovascular accident) On eliquis and aspirin.  Follow.   Essential hypertension, benign Blood pressure has been ok on cardizem, metoprolol and losartan.  Follow pressures.  Follow metabolic panel.   Aortic atherosclerosis ([place]) Intolerant to statin medication.  Follow.    No orders of the defined types were placed in this encounter.   No orders of the defined types were placed in this encounter.    I discussed the assessment and treatment plan with the patient. The patient was provided an opportunity to ask questions and all were answered. The patient agreed with the plan and demonstrated an understanding of the instructions.   The patient was advised to call back or seek an in-person evaluation if the symptoms worsen or if the condition fails to improve as anticipated.  I provided 25 minutes of non-face-to-face time during this encounter.   Einar Pheasant, MD

## 2020-03-31 NOTE — Assessment & Plan Note (Signed)
Low carb diet and exercise.  Follow met b and a1c.  

## 2020-03-31 NOTE — Assessment & Plan Note (Signed)
On eliquis and aspirin.  Follow.

## 2020-03-31 NOTE — Assessment & Plan Note (Signed)
Recently admitted with afib.  Placed initially on cardizem drip.  Converted to oral cardizem.  Since discharge has done well on oral cardizem, metoprolol, eliquis and aspirin.  Saw cardiology.  Per note, appeared to be in SR.  Follow.

## 2020-03-31 NOTE — Assessment & Plan Note (Signed)
Intolerant to statin medication.  Follow.  

## 2020-03-31 NOTE — Assessment & Plan Note (Signed)
Intolerant to statin medication. Low cholesterol diet and exercise.  Follow lipid panel.  

## 2020-04-05 ENCOUNTER — Encounter: Payer: Medicare Other | Admitting: Internal Medicine

## 2020-04-16 ENCOUNTER — Other Ambulatory Visit: Payer: Self-pay | Admitting: Internal Medicine

## 2020-04-16 NOTE — Telephone Encounter (Signed)
Refill request for xanax, last seen 03-27-20, last filled 01-23-20.  Please advise.

## 2020-04-16 NOTE — Telephone Encounter (Signed)
rx ok'd for alprazolam #45 with one refills.

## 2020-04-17 ENCOUNTER — Telehealth: Payer: Self-pay | Admitting: Internal Medicine

## 2020-04-17 NOTE — Telephone Encounter (Signed)
Pt called about refill on medicationALPRAZolam (XANAX) 0.5 MG tablet

## 2020-04-17 NOTE — Telephone Encounter (Signed)
Patient aware rx was sent in. 

## 2020-05-08 DIAGNOSIS — I10 Essential (primary) hypertension: Secondary | ICD-10-CM | POA: Diagnosis not present

## 2020-05-08 DIAGNOSIS — I700 Atherosclerosis of aorta: Secondary | ICD-10-CM | POA: Diagnosis not present

## 2020-05-08 DIAGNOSIS — I4891 Unspecified atrial fibrillation: Secondary | ICD-10-CM | POA: Diagnosis not present

## 2020-05-09 DIAGNOSIS — H25813 Combined forms of age-related cataract, bilateral: Secondary | ICD-10-CM | POA: Diagnosis not present

## 2020-06-05 ENCOUNTER — Telehealth: Payer: Self-pay | Admitting: Internal Medicine

## 2020-06-05 NOTE — Telephone Encounter (Signed)
Per review of medication history, xanax was refilled on 04/27/20 with #30 with one refill.  According to [place], she has only refilled on 04/17/20.  There should be another refill at the pharmacy.

## 2020-06-05 NOTE — Telephone Encounter (Signed)
Pt called in need refill on ALPRAZolam (XANAX) 0.5 MG tablet

## 2020-06-06 ENCOUNTER — Telehealth: Payer: Self-pay

## 2020-06-06 NOTE — Telephone Encounter (Signed)
Pt aware.

## 2020-06-06 NOTE — Telephone Encounter (Signed)
She will come to the office on 8/9 and do a pessary fitting.

## 2020-06-06 NOTE — Telephone Encounter (Signed)
Pt calling; can [facility] order her a pessary that looks like a diaphram? Says she's already been fitted for a pessary but it came out. [phone]

## 2020-06-07 NOTE — Telephone Encounter (Signed)
Pt aware.  States [facility] called her yesterday.

## 2020-06-17 ENCOUNTER — Ambulatory Visit: Payer: Medicare Other | Admitting: Obstetrics and Gynecology

## 2020-06-17 DIAGNOSIS — I4891 Unspecified atrial fibrillation: Secondary | ICD-10-CM | POA: Diagnosis not present

## 2020-06-17 DIAGNOSIS — I700 Atherosclerosis of aorta: Secondary | ICD-10-CM | POA: Diagnosis not present

## 2020-06-17 DIAGNOSIS — I10 Essential (primary) hypertension: Secondary | ICD-10-CM | POA: Diagnosis not present

## 2020-07-02 NOTE — Telephone Encounter (Signed)
Pt called triage stating she would like to cancel her appointment for Monday 8/9. She wants CS to order her a pessary that looks like a Diaphram . Once it has been ordered please call pt and let her know and she will come in for a fitting.   Pt is aware CS is out of the office this week. Appointment has been cancelled

## 2020-07-05 ENCOUNTER — Telehealth (INDEPENDENT_AMBULATORY_CARE_PROVIDER_SITE_OTHER): Payer: Medicare Other | Admitting: Internal Medicine

## 2020-07-05 ENCOUNTER — Telehealth: Payer: Self-pay | Admitting: Internal Medicine

## 2020-07-05 DIAGNOSIS — R739 Hyperglycemia, unspecified: Secondary | ICD-10-CM

## 2020-07-05 DIAGNOSIS — E7800 Pure hypercholesterolemia, unspecified: Secondary | ICD-10-CM | POA: Diagnosis not present

## 2020-07-05 DIAGNOSIS — I700 Atherosclerosis of aorta: Secondary | ICD-10-CM

## 2020-07-05 DIAGNOSIS — I10 Essential (primary) hypertension: Secondary | ICD-10-CM

## 2020-07-05 DIAGNOSIS — G8929 Other chronic pain: Secondary | ICD-10-CM

## 2020-07-05 DIAGNOSIS — I4891 Unspecified atrial fibrillation: Secondary | ICD-10-CM | POA: Diagnosis not present

## 2020-07-05 DIAGNOSIS — G6289 Other specified polyneuropathies: Secondary | ICD-10-CM | POA: Diagnosis not present

## 2020-07-05 DIAGNOSIS — Z8673 Personal history of transient ischemic attack (TIA), and cerebral infarction without residual deficits: Secondary | ICD-10-CM

## 2020-07-05 DIAGNOSIS — M549 Dorsalgia, unspecified: Secondary | ICD-10-CM

## 2020-07-05 DIAGNOSIS — F419 Anxiety disorder, unspecified: Secondary | ICD-10-CM

## 2020-07-05 NOTE — Progress Notes (Signed)
[name], female   DOB: 22-Sep-1930, 86 y.o.   MRN: [id number]   Virtual Visit via telephone Note  This visit type was conducted due to national recommendations for restrictions regarding the COVID-19 pandemic (e.g. social distancing).  This format is felt to be most appropriate for this patient at this time.  All issues noted in this document were discussed and addressed.  No physical exam was performed (except for noted visual exam findings with Video Visits).   I connected with Sandra Reyes by telephone and verified that I am speaking with the correct person using two identifiers. Location patient: home Location provider: work Persons participating in the telephone visit: patient, provider  The limitations, risks, security and privacy concerns of performing an evaluation and management service by telephone and the availability of in person appointments have been discussed.  It has also been discussed with the patient that there may be a patient responsible charge related to this service. The patient expressed understanding and agreed to proceed.   Reason for visit: scheduled follow up.    HPI: She reports she is doing well.  Sees cardiology.  Just evaluated.  Recommended f/u in 6 months.  No chest pain.  Breathing stable.  Still with some fatigue.  States has to stop and rest when mopping, etc.  No significant change.  Overall she feels things are stable.  No abdominal pain.  Bowels moving. Uses a walker.  Has neuropathy.  Legs feel stiff. Discussed therapy.  She declines.  No cough or congestion.  Not as nervous.  Does take 1/2 xanax to relax to sleep.  Feels still needs.  Overall doing better.     ROS: See pertinent positives and negatives per HPI.  Past Medical History:  Diagnosis Date  . Anxiety   . Collagen vascular disease ([place])   . Degenerative disc disease   . Depression   . GERD (gastroesophageal reflux disease)   . Hyperlipidemia   . Hypertension     Past  Surgical History:  Procedure Laterality Date  . ABDOMINAL HYSTERECTOMY  1997   uterus and cervix removed, reports she still has her ovaries  . BREAST SURGERY  1960 to 1970   lumps removed  . CYSTOCELE REPAIR    . ESOPHAGOGASTRODUODENOSCOPY (EGD) WITH PROPOFOL N/A 10/03/2016   Procedure: ESOPHAGOGASTRODUODENOSCOPY (EGD) WITH PROPOFOL;  Surgeon: Manya Silvas, MD;  [facility];  Service: Endoscopy;  Laterality: N/A;  . RECTOCELE REPAIR      Family History  Problem Relation Age of Onset  . Heart disease Mother   . Arthritis Mother   . Ulcers Father        uremic poisoning  . Arthritis Sister        x 4  . Hyperlipidemia Sister        x 1  . Hypertension Sister   . Cancer Sister        x 2 (ovarian cancer) & 1 (colon cancer)  . Diabetes Sister   . Dementia Brother   . Dementia Brother     SOCIAL HX: reviewed.     Current Outpatient Medications:  .  Alpha-Lipoic Acid 300 MG TABS, Take 300 mg by mouth daily., Disp: , Rfl:  .  ALPRAZolam (XANAX) 0.5 MG tablet, TAKE 1/2 TABLET BY MOUTH ONCE DAILY AND 1 TABLET AT BEDTIME, Disp: 45 tablet, Rfl: 1 .  apixaban (ELIQUIS) 5 MG TABS tablet, Take 1 tablet (5 mg total) by mouth 2 (two) times daily.,  Disp: 60 tablet, Rfl: 1 .  aspirin EC 81 MG tablet, Take 81 mg by mouth daily., Disp: , Rfl:  .  diltiazem (CARDIZEM CD) 120 MG 24 hr capsule, Take 1 capsule (120 mg total) by mouth daily., Disp: 30 capsule, Rfl: 0 .  famotidine (PEPCID) 20 MG tablet, TAKE 1 TABLET BY MOUTH  DAILY, Disp: 90 tablet, Rfl: 3 .  losartan (COZAAR) 25 MG tablet, Take 25 mg by mouth daily., Disp: , Rfl:  .  Multiple Vitamin (MULTIVITAMIN) tablet, Take 1 tablet by mouth daily., Disp: , Rfl:  .  vitamin E 400 UNIT capsule, Take 400 Units by mouth daily., Disp: , Rfl:   EXAM:  VITALS per patient if applicable:  465/68, 127/51, pulse 70s.   GENERAL: alert. Sounds to be in no acute distress.  Answering questions appropriately.    PSYCH/NEURO: pleasant  and cooperative, no obvious depression or anxiety, speech and thought processing grossly intact  ASSESSMENT AND PLAN:  Discussed the following assessment and plan:  Rapid atrial fibrillation [facility]) Recently admitted with afib.  Placed on oral cardizem.  Off metoprolol.  dnies any significant increased heart rate or palpitations.  Followed by cardiology.  On eliquis.   Peripheral neuropathy Uses a walker.  Stable.  Follow.  Discussed therapy.  She declines.   Hyperglycemia Low carb diet and exercise.  Follow met b and a1c.   Hypercholesterolemia Intolerant to statin medications.  Low cholesterol diet and exercise.  Follow lipid panel.   History of CVA (cerebrovascular accident) On eliquis and asprin.  Follow.  Stable.   Essential hypertension, benign Hypertension as outlined.  Continue cardizem, metoprolol and losartan.  Follow pressures.  Follow metabolic panel.    Chronic back pain Stable.    Aortic atherosclerosis ([place]) Intolerant to statin medication.  Follow.   Anxiety Doing better.  Takes xanax.  Follow.    Orders Placed This Encounter  Procedures  . Hepatic function panel    Standing Status:   Future    Standing Expiration Date:   07/06/2021  . Hemoglobin A1c    Standing Status:   Future    Standing Expiration Date:   07/06/2021  . Lipid panel    Standing Status:   Future    Standing Expiration Date:   07/06/2021  . Basic metabolic panel    Standing Status:   Future    Standing Expiration Date:   07/06/2021    No orders of the defined types were placed in this encounter.    I discussed the assessment and treatment plan with the patient. The patient was provided an opportunity to ask questions and all were answered. The patient agreed with the plan and demonstrated an understanding of the instructions.   The patient was advised to call back or seek an in-person evaluation if the symptoms worsen or if the condition fails to improve as anticipated.  I provided 22  minutes of non-face-to-face time during this encounter.   Einar Pheasant, MD

## 2020-07-05 NOTE — Telephone Encounter (Signed)
Lm to schedule  Fasting labs in 6-8 weeks  53m follow up

## 2020-07-06 ENCOUNTER — Encounter: Payer: Self-pay | Admitting: Internal Medicine

## 2020-07-06 NOTE — Assessment & Plan Note (Addendum)
Uses a walker.  Stable.  Follow.  Discussed therapy.  She declines.

## 2020-07-06 NOTE — Assessment & Plan Note (Signed)
Stable

## 2020-07-06 NOTE — Assessment & Plan Note (Signed)
Doing better.  Takes xanax.  Follow.

## 2020-07-06 NOTE — Assessment & Plan Note (Signed)
Recently admitted with afib.  Placed on oral cardizem.  Off metoprolol.  dnies any significant increased heart rate or palpitations.  Followed by cardiology.  On eliquis.

## 2020-07-06 NOTE — Assessment & Plan Note (Signed)
Hypertension as outlined.  Continue cardizem, metoprolol and losartan.  Follow pressures.  Follow metabolic panel.

## 2020-07-06 NOTE — Assessment & Plan Note (Signed)
Intolerant to statin medications.  Low cholesterol diet and exercise.  Follow lipid panel.

## 2020-07-06 NOTE — Assessment & Plan Note (Signed)
Low carb diet and exercise.  Follow met b and a1c.  

## 2020-07-06 NOTE — Assessment & Plan Note (Signed)
On eliquis and asprin.  Follow.  Stable.

## 2020-07-06 NOTE — Assessment & Plan Note (Signed)
Intolerant to statin medication.  Follow.  

## 2020-07-08 ENCOUNTER — Ambulatory Visit: Payer: Medicare Other | Admitting: Obstetrics and Gynecology

## 2020-07-17 NOTE — Telephone Encounter (Signed)
This patient was last seen June of 2020- at that time she had a donut pessary- I don't know what would work for her since it has been over a year. I think it is best for her to come in for a fitting

## 2020-07-18 NOTE — Telephone Encounter (Signed)
Please call pt and make her aware of [place] message

## 2020-07-24 ENCOUNTER — Telehealth: Payer: Self-pay | Admitting: Internal Medicine

## 2020-07-24 NOTE — Telephone Encounter (Signed)
Patient aware. She states she is doing ok and is just going to leave it be for now. If she changes her mind, she is aware she needs to be seen for fitting prior to ordering a different type pessary.

## 2020-07-24 NOTE — Telephone Encounter (Signed)
Pt needs a refill on ALPRAZolam (XANAX) 0.5 MG tablet sent to [facility]  She also wanted to know if she could be put back on meloxicam with her having A-fib

## 2020-07-25 MED ORDER — ALPRAZOLAM 0.5 MG PO TABS: ORAL_TABLET | ORAL | 1 refills | Status: DC

## 2020-07-25 NOTE — Telephone Encounter (Signed)
Pt aware.

## 2020-07-25 NOTE — Addendum Note (Signed)
Addended by: Alisa Graff on: 07/25/2020 05:40 AM   Modules accepted: Orders

## 2020-07-25 NOTE — Telephone Encounter (Signed)
rx sent in for alprazolam.  Regarding meloxicam, given she is on eliquis and given monitoring blood pressure, would like to avoid meloxicam.

## 2020-08-29 ENCOUNTER — Encounter: Payer: Medicare Other | Admitting: Obstetrics and Gynecology

## 2020-08-31 DIAGNOSIS — R1310 Dysphagia, unspecified: Secondary | ICD-10-CM | POA: Diagnosis not present

## 2020-08-31 DIAGNOSIS — Z8673 Personal history of transient ischemic attack (TIA), and cerebral infarction without residual deficits: Secondary | ICD-10-CM | POA: Diagnosis not present

## 2020-08-31 DIAGNOSIS — R110 Nausea: Secondary | ICD-10-CM | POA: Diagnosis not present

## 2020-08-31 DIAGNOSIS — K449 Diaphragmatic hernia without obstruction or gangrene: Secondary | ICD-10-CM | POA: Diagnosis not present

## 2020-09-03 DIAGNOSIS — K449 Diaphragmatic hernia without obstruction or gangrene: Secondary | ICD-10-CM | POA: Diagnosis not present

## 2020-09-04 ENCOUNTER — Other Ambulatory Visit: Payer: Self-pay | Admitting: Internal Medicine

## 2020-09-05 NOTE — Telephone Encounter (Signed)
Per PDMP review, it appears she had this refilled on 09/04/20.  Please confirm.

## 2020-09-11 ENCOUNTER — Telehealth: Payer: Self-pay

## 2020-09-11 NOTE — Telephone Encounter (Signed)
Pt wants Dr. Nicki Reaper to draw her a picture of her hiatal hernia. She went to hospital on 08/31/20.

## 2020-09-12 ENCOUNTER — Ambulatory Visit: Payer: Medicare Other

## 2020-09-12 NOTE — Telephone Encounter (Signed)
LMTCB to discuss.

## 2020-09-13 ENCOUNTER — Ambulatory Visit: Payer: Medicare Other

## 2020-09-17 NOTE — Telephone Encounter (Signed)
LMTCB

## 2020-09-18 NOTE — Telephone Encounter (Signed)
LMTCB

## 2020-09-19 NOTE — Telephone Encounter (Signed)
Unable to reach patient after 3 attempts

## 2020-09-26 ENCOUNTER — Encounter: Payer: Medicare Other | Admitting: Obstetrics and Gynecology

## 2020-10-14 ENCOUNTER — Telehealth: Payer: Self-pay | Admitting: Internal Medicine

## 2020-10-14 MED ORDER — ALPRAZOLAM 0.5 MG PO TABS: ORAL_TABLET | ORAL | 1 refills | Status: DC

## 2020-10-14 NOTE — Telephone Encounter (Signed)
Schedule f/u appt in 1-2 months.  Thanks.

## 2020-10-14 NOTE — Telephone Encounter (Signed)
rx ok'd for clonazepam #45 with one refill.

## 2020-10-14 NOTE — Telephone Encounter (Signed)
Patient needs a refill on her ALPRAZolam (XANAX) 0.5 MG tablet.

## 2020-10-15 NOTE — Telephone Encounter (Signed)
Called and scheduled pt for 11/19 @ 12

## 2020-10-18 ENCOUNTER — Telehealth (INDEPENDENT_AMBULATORY_CARE_PROVIDER_SITE_OTHER): Payer: Medicare Other | Admitting: Internal Medicine

## 2020-10-18 ENCOUNTER — Encounter: Payer: Self-pay | Admitting: Internal Medicine

## 2020-10-18 ENCOUNTER — Other Ambulatory Visit: Payer: Self-pay

## 2020-10-18 DIAGNOSIS — I700 Atherosclerosis of aorta: Secondary | ICD-10-CM | POA: Diagnosis not present

## 2020-10-18 DIAGNOSIS — I10 Essential (primary) hypertension: Secondary | ICD-10-CM

## 2020-10-18 DIAGNOSIS — R739 Hyperglycemia, unspecified: Secondary | ICD-10-CM

## 2020-10-18 DIAGNOSIS — K219 Gastro-esophageal reflux disease without esophagitis: Secondary | ICD-10-CM

## 2020-10-18 DIAGNOSIS — E7800 Pure hypercholesterolemia, unspecified: Secondary | ICD-10-CM

## 2020-10-18 DIAGNOSIS — I4891 Unspecified atrial fibrillation: Secondary | ICD-10-CM

## 2020-10-18 DIAGNOSIS — G8929 Other chronic pain: Secondary | ICD-10-CM

## 2020-10-18 DIAGNOSIS — K449 Diaphragmatic hernia without obstruction or gangrene: Secondary | ICD-10-CM

## 2020-10-18 DIAGNOSIS — M549 Dorsalgia, unspecified: Secondary | ICD-10-CM

## 2020-10-18 NOTE — Progress Notes (Signed)
[name], female   DOB: [birth year], 86 y.o.   MRN: [id number]   Virtual Visit via telephone Note  This visit type was conducted due to national recommendations for restrictions regarding the COVID-19 pandemic (e.g. social distancing).  This format is felt to be most appropriate for this patient at this time.  All issues noted in this document were discussed and addressed.  No physical exam was performed (except for noted visual exam findings with Video Visits).   I connected with Sandra Reyes by telephone and verified that I am speaking with the correct person using two identifiers. Location patient: home Location provider: work  Persons participating in the telephone visit: patient, provider  The limitations, risks, security and privacy concerns of performing an evaluation and management service by telephone and the availability of in person appointments have been discussed.  It has also been discussed with the patient that there may be a patient responsible charge related to this service. The patient expressed understanding and agreed to proceed.   Reason for visit: scheduled follow up.    HPI: She recently with history of high grade gastric obstruction from paraesophageal hernia with mesenteroaxial gastric volvulus.  S/p surgery.  Stomach reduced in the abdomen and no evidence of ischemia or perforation - this was in 2017.  Recently presented to [facility] ED for dysphagia and found to have large paraesophageal hernia.  Evaluated by Dr Lasandra Beech.  Given minimal symptoms and able to eat and tolerate po it was decided to hold on surgery.  Recommended puree diet.  She is eating.  No nausea or vomiting.  Bowels moving.  No chest pain or sob reported.  Back is better.  Uses a walker.  Blood pressures doing well - averaging 120s-130s/60-70s.  Overall she feels she is doing relatively well.    ROS: See pertinent positives and negatives per HPI.  Past Medical History:  Diagnosis  Date  . Anxiety   . Collagen vascular disease ([place])   . Degenerative disc disease   . Depression   . GERD (gastroesophageal reflux disease)   . Hyperlipidemia   . Hypertension     Past Surgical History:  Procedure Laterality Date  . ABDOMINAL HYSTERECTOMY  1997   uterus and cervix removed, reports she still has her ovaries  . BREAST SURGERY  1960 to 1970   lumps removed  . CYSTOCELE REPAIR    . ESOPHAGOGASTRODUODENOSCOPY (EGD) WITH PROPOFOL N/A 10/03/2016   Procedure: ESOPHAGOGASTRODUODENOSCOPY (EGD) WITH PROPOFOL;  Surgeon: Manya Silvas, MD;  Location: [facility] ENDOSCOPY;  Service: Endoscopy;  Laterality: N/A;  . RECTOCELE REPAIR      Family History  Problem Relation Age of Onset  . Heart disease Mother   . Arthritis Mother   . Ulcers Father        uremic poisoning  . Arthritis Sister        x 4  . Hyperlipidemia Sister        x 1  . Hypertension Sister   . Cancer Sister        x 2 (ovarian cancer) & 1 (colon cancer)  . Diabetes Sister   . Dementia Brother   . Dementia Brother     SOCIAL HX: reviewed.    Current Outpatient Medications:  .  Alpha-Lipoic Acid 300 MG TABS, Take 300 mg by mouth daily., Disp: , Rfl:  .  ALPRAZolam (XANAX) 0.5 MG tablet, TAKE 1/2 TABLET BY MOUTH ONCE DAILY AND 1 TABLET AT BEDTIME, Disp: 45  tablet, Rfl: 1 .  apixaban (ELIQUIS) 5 MG TABS tablet, Take 1 tablet (5 mg total) by mouth 2 (two) times daily., Disp: 60 tablet, Rfl: 1 .  aspirin EC 81 MG tablet, Take 81 mg by mouth daily., Disp: , Rfl:  .  diltiazem (CARDIZEM CD) 120 MG 24 hr capsule, Take 1 capsule (120 mg total) by mouth daily., Disp: 30 capsule, Rfl: 0 .  famotidine (PEPCID) 20 MG tablet, TAKE 1 TABLET BY MOUTH  DAILY, Disp: 90 tablet, Rfl: 3 .  losartan (COZAAR) 25 MG tablet, Take 25 mg by mouth daily., Disp: , Rfl:  .  Multiple Vitamin (MULTIVITAMIN) tablet, Take 1 tablet by mouth daily., Disp: , Rfl:   EXAM:  GENERAL: alert, oriented, appears well and in no acute  distress  HEENT: atraumatic, conjunttiva clear, no obvious abnormalities on inspection of external nose and ears  NECK: normal movements of the head and neck  LUNGS: on inspection no signs of respiratory distress, breathing rate appears normal, no obvious gross SOB, gasping or wheezing  CV: no obvious cyanosis  PSYCH/NEURO: pleasant and cooperative, no obvious depression or anxiety, speech and thought processing grossly intact  ASSESSMENT AND PLAN:  Discussed the following assessment and plan:  Problem List Items Addressed This Visit    Rapid atrial fibrillation ([place])    On oral cardizem.  Doing well.  Denies increased heart rate or palpitations.  Follow.       Hyperglycemia    Low carb diet and exercise.  Follow met b and a1c.       Hypercholesterolemia    Intolerant to statin medication.  Low cholesterol diet and exercise.  Follow lipid panel.       GERD (gastroesophageal reflux disease)    On pepcid.  No acid reflux.  Follow.       Essential hypertension, benign    Continue cardizem and losartan.  Blood pressures as outlined.  Follow.       Diaphragmatic hernia    S/p surgery as outlined.  Recent problems with dysphagia.  Surgery evaluated.  Elected to hold on surgery. Puree diet.  Eating.  Follow.        Chronic back pain    Back is doing better.  No increased pain.  Follow.       Aortic atherosclerosis ([place])    Intolerant to statin medication.  Follow.           I discussed the assessment and treatment plan with the patient. The patient was provided an opportunity to ask questions and all were answered. The patient agreed with the plan and demonstrated an understanding of the instructions.   The patient was advised to call back or seek an in-person evaluation if the symptoms worsen or if the condition fails to improve as anticipated.    Einar Pheasant, MD

## 2020-10-19 NOTE — Assessment & Plan Note (Signed)
Low carb diet and exercise.  Follow met b and a1c.  

## 2020-10-19 NOTE — Assessment & Plan Note (Signed)
Intolerant to statin medication. Low cholesterol diet and exercise.  Follow lipid panel.  

## 2020-10-19 NOTE — Assessment & Plan Note (Signed)
S/p surgery as outlined.  Recent problems with dysphagia.  Surgery evaluated.  Elected to hold on surgery. Puree diet.  Eating.  Follow.

## 2020-10-19 NOTE — Assessment & Plan Note (Signed)
On oral cardizem.  Doing well.  Denies increased heart rate or palpitations.  Follow.

## 2020-10-19 NOTE — Assessment & Plan Note (Signed)
Continue cardizem and losartan.  Blood pressures as outlined.  Follow.

## 2020-10-19 NOTE — Assessment & Plan Note (Signed)
On pepcid.  No acid reflux.  Follow.

## 2020-10-19 NOTE — Assessment & Plan Note (Signed)
Intolerant to statin medication.  Follow.  

## 2020-10-19 NOTE — Assessment & Plan Note (Signed)
Back is doing better.  No increased pain.  Follow.

## 2020-10-23 ENCOUNTER — Encounter: Payer: Medicare Other | Admitting: Obstetrics and Gynecology

## 2020-11-26 ENCOUNTER — Other Ambulatory Visit: Payer: Self-pay | Admitting: Internal Medicine

## 2020-11-28 ENCOUNTER — Encounter: Payer: Medicare Other | Admitting: Obstetrics and Gynecology

## 2020-11-28 NOTE — Telephone Encounter (Signed)
Received request for refill xanax.  Was just refilled 11/26/20.  Should not need now.  Needs 3-4 month f/u appt scheduled.

## 2020-12-26 DIAGNOSIS — I4891 Unspecified atrial fibrillation: Secondary | ICD-10-CM | POA: Diagnosis not present

## 2021-01-02 ENCOUNTER — Telehealth: Payer: Self-pay

## 2021-01-02 DIAGNOSIS — K449 Diaphragmatic hernia without obstruction or gangrene: Secondary | ICD-10-CM

## 2021-01-02 NOTE — Telephone Encounter (Signed)
Need to know why she needs referral.  Will place order, just need reason for referral.

## 2021-01-02 NOTE — Telephone Encounter (Signed)
Pt called and would like a referral to a new GI doctor. Please advise

## 2021-01-02 NOTE — Telephone Encounter (Signed)
Patients GI doctor is leaving the practice he is at so she needs a referral to a different GI MD. She would like to be sent to [place]

## 2021-01-03 ENCOUNTER — Other Ambulatory Visit
Admission: RE | Admit: 2021-01-03 | Discharge: 2021-01-03 | Disposition: A | Discharge status: Home / Self Care | Payer: Medicare Other | Source: Ambulatory Visit | Attending: Internal Medicine | Admitting: Internal Medicine

## 2021-01-03 DIAGNOSIS — K449 Diaphragmatic hernia without obstruction or gangrene: Secondary | ICD-10-CM | POA: Diagnosis not present

## 2021-01-03 DIAGNOSIS — R109 Unspecified abdominal pain: Secondary | ICD-10-CM | POA: Diagnosis not present

## 2021-01-03 DIAGNOSIS — R1012 Left upper quadrant pain: Secondary | ICD-10-CM | POA: Diagnosis not present

## 2021-01-03 LAB — TROPONIN I (HIGH SENSITIVITY): Troponin I (High Sensitivity): 7 ng/L (ref <18)

## 2021-01-03 LAB — FIBRIN DERIVATIVES D-DIMER (ARMC ONLY): Fibrin derivatives D-dimer (ARMC): 493.96 ng/mL (FEU) (ref 0.00–499.00)

## 2021-01-03 NOTE — Telephone Encounter (Signed)
Pt called in and was checking on when she would get in with a GI doctor. I let her know referral was placed today. She then proceeds to say that she is dizzy and that she was in pain up to her chest where her breast is. Tried transferring to access nurse but was on hold for over 5 mins. Transferred patient to [facility] to be triaged.

## 2021-01-03 NOTE — Telephone Encounter (Signed)
Pt is having pain under her ribs. Stated she does not feel like she is dying, she IS dying because part of her intestines are in her esophagus and eventually she will die from this. She is going to go to [facility] walk in to be evaluated. Confirmed no active chest pain, SOB, etc. Patient was getting ready to leave her house to go to [facility]

## 2021-01-03 NOTE — Telephone Encounter (Signed)
She said they were following her for her hiatal hernia

## 2021-01-03 NOTE — Telephone Encounter (Signed)
Pt said that she feels like she is dying and wanted Dr. Nicki Reaper to call her in pain medicine because Dr. Ubaldo Glassing said she would.. I told her that if she feels like she is dying she needs to go to the ED she declined

## 2021-01-03 NOTE — Addendum Note (Signed)
Addended by: Alisa Graff on: 01/03/2021 07:29 AM   Modules accepted: Orders

## 2021-01-03 NOTE — Telephone Encounter (Signed)
Order placed for GI referral.   

## 2021-01-03 NOTE — Telephone Encounter (Signed)
If increased pain and per note (feeling like she is dying) - needs to be evaluated. Need to confirm etiology of pain.  Dr Ubaldo Glassing is her cardiologist.  I am not aware that she has talked to him about her current symptoms and that he told her to call me for a pain medication.  Needs to be evaluated now.  Can call 911 if need to.

## 2021-01-03 NOTE — Telephone Encounter (Signed)
Patient stated she is having dizziness and having a sharp pain under her ribs. Her BP is elevated currently 155/85. Chest pain started lastnight. She stated she wasn't supposed to lay down and had to lay down for a long time at her dentist . BM are moving just fine. She is concerned about her hernia. She stated its similar to her past pain with a hernia.  After speaking to patient in instructed her to go to [facility] to be evaluated. She stated she would comply.

## 2021-01-08 ENCOUNTER — Telehealth: Payer: Self-pay | Admitting: Internal Medicine

## 2021-01-08 MED ORDER — ALPRAZOLAM 0.5 MG PO TABS: ORAL_TABLET | ORAL | 1 refills | Status: DC

## 2021-01-08 NOTE — Telephone Encounter (Signed)
Called and scheduled pt

## 2021-01-08 NOTE — Telephone Encounter (Signed)
rx sent in for alprazolam.  Needs a f/u appt scheduled for end of March 2022.

## 2021-01-08 NOTE — Addendum Note (Signed)
Addended by: Alisa Graff on: 01/08/2021 01:46 PM   Modules accepted: Orders

## 2021-01-08 NOTE — Telephone Encounter (Signed)
Pt called she needs a refill on ALPRAZolam (XANAX) 0.5 MG tablet sent [facility]

## 2021-01-31 ENCOUNTER — Other Ambulatory Visit: Payer: Self-pay | Admitting: Internal Medicine

## 2021-02-21 IMAGING — DX DG CHEST 1V PORT
1 series · 1 of 1 positions shown · non-contrast
Comparison: 10/02/2016

CLINICAL DATA: Hypertension, tachycardia, palpitations

EXAM:
PORTABLE CHEST 1 VIEW

[chest ap]
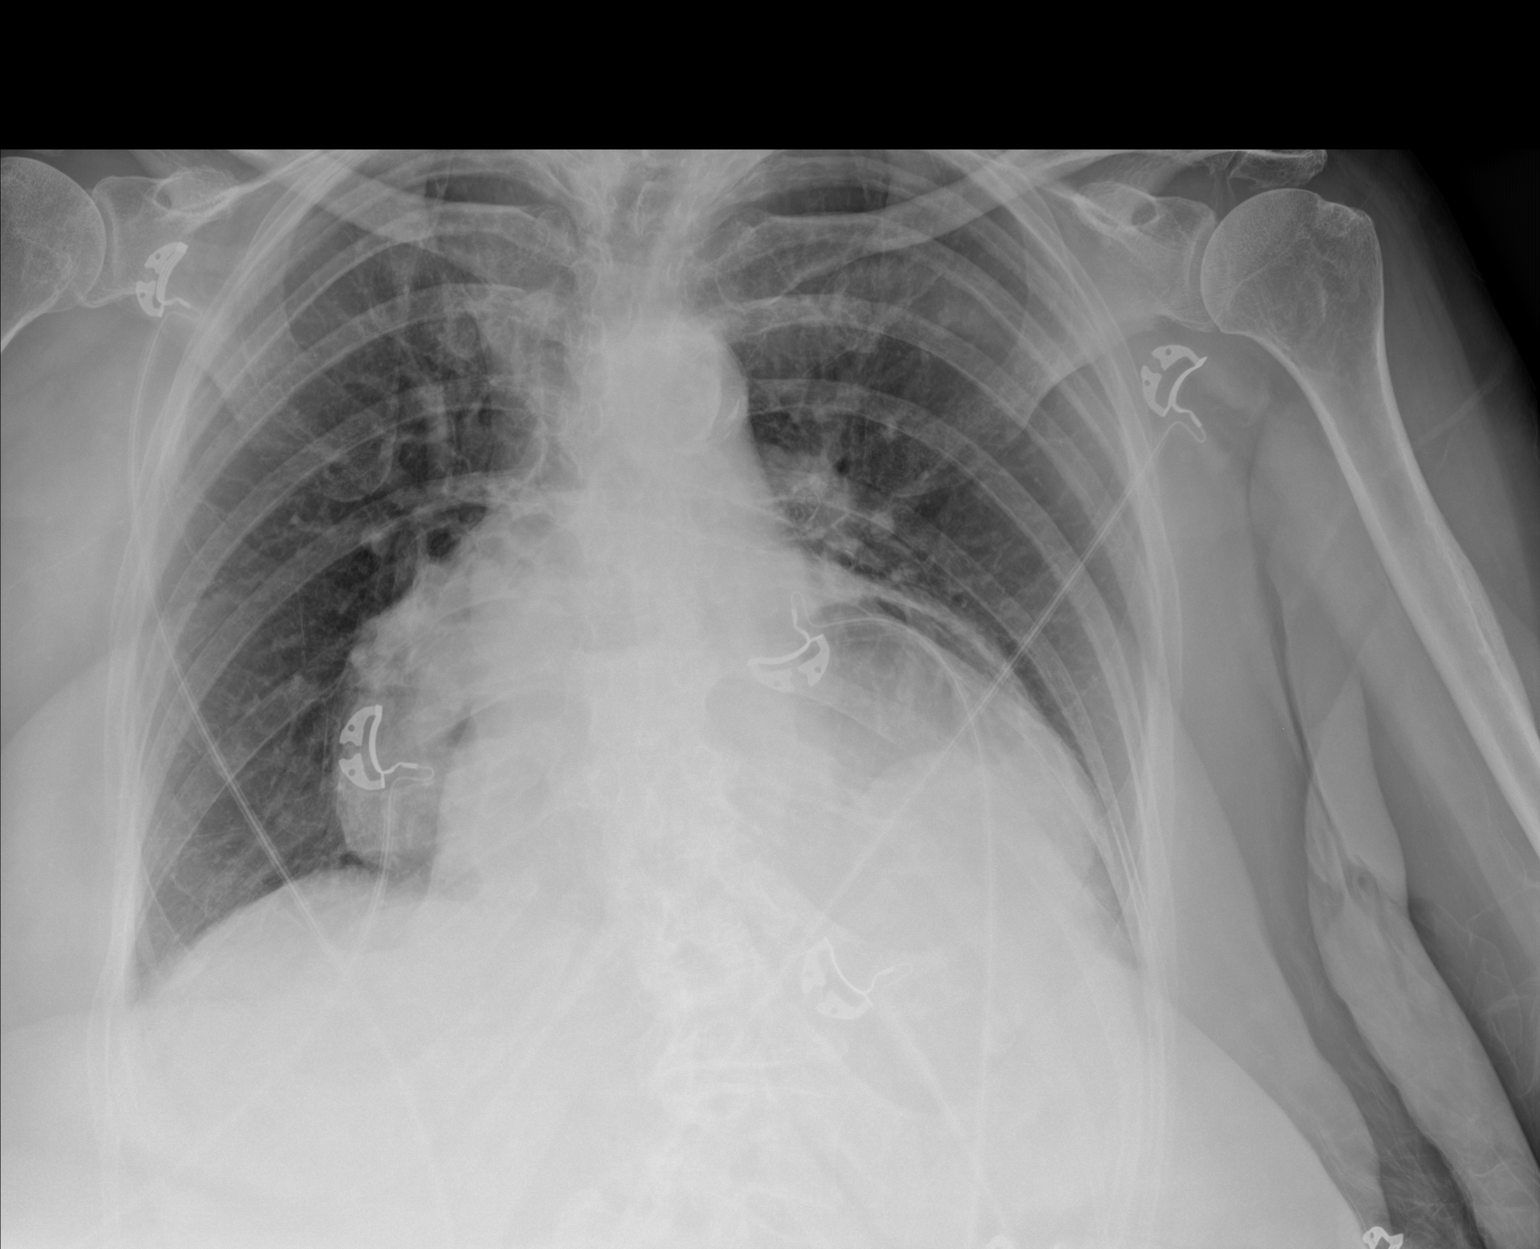

[1 of 1 positions shown; findings below may reference images not displayed]

FINDINGS: Single frontal view of the chest demonstrates stable cardiac
silhouette. Large hiatal hernia unchanged. No acute airspace
disease, effusion, or pneumothorax. Likely chronic compressive
atelectasis at the left base. No acute bony abnormalities.
IMPRESSION: 1. Stable exam, no acute process.
2. Large hiatal hernia.

## 2021-02-27 ENCOUNTER — Other Ambulatory Visit: Payer: Self-pay

## 2021-02-27 ENCOUNTER — Ambulatory Visit (INDEPENDENT_AMBULATORY_CARE_PROVIDER_SITE_OTHER): Payer: Medicare Other | Admitting: Internal Medicine

## 2021-02-27 DIAGNOSIS — E7800 Pure hypercholesterolemia, unspecified: Secondary | ICD-10-CM

## 2021-02-27 DIAGNOSIS — I700 Atherosclerosis of aorta: Secondary | ICD-10-CM

## 2021-02-27 DIAGNOSIS — Z8673 Personal history of transient ischemic attack (TIA), and cerebral infarction without residual deficits: Secondary | ICD-10-CM | POA: Diagnosis not present

## 2021-02-27 DIAGNOSIS — R739 Hyperglycemia, unspecified: Secondary | ICD-10-CM | POA: Diagnosis not present

## 2021-02-27 DIAGNOSIS — I10 Essential (primary) hypertension: Secondary | ICD-10-CM

## 2021-02-27 DIAGNOSIS — G6289 Other specified polyneuropathies: Secondary | ICD-10-CM | POA: Diagnosis not present

## 2021-02-27 DIAGNOSIS — K449 Diaphragmatic hernia without obstruction or gangrene: Secondary | ICD-10-CM | POA: Diagnosis not present

## 2021-02-27 DIAGNOSIS — F419 Anxiety disorder, unspecified: Secondary | ICD-10-CM

## 2021-02-27 DIAGNOSIS — I4891 Unspecified atrial fibrillation: Secondary | ICD-10-CM

## 2021-02-27 NOTE — Progress Notes (Signed)
[name], female   DOB: Sep 09, 1930, 87 y.o.   MRN: [id number]   Subjective:    [name], female    DOB: 10-24-1930, 87 y.o.   MRN: [id number]  HPI This visit occurred during the SARS-CoV-2 public health emergency.  Safety protocols were in place, including screening questions prior to the visit, additional usage of staff PPE, and extensive cleaning of exam room while observing appropriate contact time as indicated for disinfecting solutions.  Patient here for a scheduled follow up.  Was evaluated at acute care for left flank pain.  Has a history of large hiatal hernia.  pepcid was changed to prilosec.  Was prescribed cipro for possible UTI.  She reports she is feeling better.  No flank pain. No urinary symptoms.  No chest pain.  Breathing stable.  She is eating.  States she is eating regular food. Pureed food initially, but she reports if she eats slowly and chews food well, she is doing well with her eating.  No nausea or vomiting.  Bowels moving.     Past Medical History:  Diagnosis Date  . Anxiety   . Collagen vascular disease ([place])   . Degenerative disc disease   . Depression   . GERD (gastroesophageal reflux disease)   . Hyperlipidemia   . Hypertension    Past Surgical History:  Procedure Laterality Date  . ABDOMINAL HYSTERECTOMY  1997   uterus and cervix removed, reports she still has her ovaries  . BREAST SURGERY  1960 to 1970   lumps removed  . CYSTOCELE REPAIR    . ESOPHAGOGASTRODUODENOSCOPY (EGD) WITH PROPOFOL N/A 10/03/2016   Procedure: ESOPHAGOGASTRODUODENOSCOPY (EGD) WITH PROPOFOL;  Surgeon: Manya Silvas, MD;  Location: [facility] ENDOSCOPY;  Service: Endoscopy;  Laterality: N/A;  . RECTOCELE REPAIR     Family History  Problem Relation Age of Onset  . Heart disease Mother   . Arthritis Mother   . Ulcers Father        uremic poisoning  . Arthritis Sister        x 4  . Hyperlipidemia Sister        x 1  . Hypertension Sister   . Cancer  Sister        x 2 (ovarian cancer) & 1 (colon cancer)  . Diabetes Sister   . Dementia Brother   . Dementia Brother    Social History   Socioeconomic History  . Marital status: Widowed    Spouse name: Not on file  . Number of children: Not on file  . Years of education: Not on file  . Highest education level: Not on file  Occupational History  . Not on file  Tobacco Use  . Smoking status: Never Smoker  . Smokeless tobacco: Never Used  Vaping Use  . Vaping Use: Never used  Substance and Sexual Activity  . Alcohol use: No    Alcohol/week: 0.0 standard drinks  . Drug use: No  . Sexual activity: Not Currently    Birth control/protection: None  Other Topics Concern  . Not on file  Social History Narrative  . Not on file   Social Determinants of Health   Financial Resource Strain: Not on file  Food Insecurity: Not on file  Transportation Needs: Not on file  Physical Activity: Not on file  Stress: Not on file  Social Connections: Not on file    Outpatient Encounter Medications as of 02/27/2021  Medication Sig  . Alpha-Lipoic Acid  300 MG TABS Take 300 mg by mouth daily.  Marland Kitchen ALPRAZolam (XANAX) 0.5 MG tablet TAKE 1/2 TABLET BY MOUTH ONCE DAILY AND 1 TABLET AT BEDTIME  . apixaban (ELIQUIS) 5 MG TABS tablet Take 1 tablet (5 mg total) by mouth 2 (two) times daily.  Marland Kitchen aspirin EC 81 MG tablet Take 81 mg by mouth daily.  Marland Kitchen diltiazem (CARDIZEM CD) 120 MG 24 hr capsule Take 1 capsule (120 mg total) by mouth daily.  . famotidine (PEPCID) 20 MG tablet TAKE 1 TABLET BY MOUTH  DAILY  . losartan (COZAAR) 25 MG tablet Take 25 mg by mouth daily.  . Multiple Vitamin (MULTIVITAMIN) tablet Take 1 tablet by mouth daily.   No facility-administered encounter medications on file as of 02/27/2021.    Review of Systems  Constitutional: Negative for appetite change and unexpected weight change.  HENT: Negative for congestion and sinus pressure.   Respiratory: Negative for cough, chest tightness  and shortness of breath.   Cardiovascular: Negative for chest pain, palpitations and leg swelling.  Gastrointestinal: Negative for abdominal pain, diarrhea, nausea and vomiting.  Genitourinary: Negative for difficulty urinating and dysuria.  Musculoskeletal: Negative for joint swelling and myalgias.  Skin: Negative for color change and rash.  Neurological: Negative for dizziness, light-headedness and headaches.  Psychiatric/Behavioral: Negative for agitation and dysphoric mood.       Objective:    Physical Exam Vitals reviewed.  Constitutional:      General: She is not in acute distress.    Appearance: Normal appearance.  HENT:     Head: Normocephalic and atraumatic.     Right Ear: External ear normal.     Left Ear: External ear normal.  Eyes:     General: No scleral icterus.       Right eye: No discharge.        Left eye: No discharge.     Conjunctiva/sclera: Conjunctivae normal.  Neck:     Thyroid: No thyromegaly.  Cardiovascular:     Rate and Rhythm: Normal rate and regular rhythm.  Pulmonary:     Effort: No respiratory distress.     Breath sounds: Normal breath sounds. No wheezing.  Abdominal:     General: Bowel sounds are normal.     Palpations: Abdomen is soft.     Tenderness: There is no abdominal tenderness.  Musculoskeletal:        General: No swelling or tenderness.     Cervical back: Neck supple. No tenderness.  Lymphadenopathy:     Cervical: No cervical adenopathy.  Skin:    Findings: No erythema or rash.  Neurological:     Mental Status: She is alert.  Psychiatric:        Mood and Affect: Mood normal.        Behavior: Behavior normal.     BP 122/70   Pulse 82   Temp (!) 96.8 F (36 C) (Oral)   Resp 16   Ht 5' (1.524 m)   Wt 178 lb (80.7 kg)   SpO2 97%   BMI 34.76 kg/m  Wt Readings from Last 3 Encounters:  02/27/21 178 lb (80.7 kg)  10/18/20 172 lb (78 kg)  03/27/20 180 lb (81.6 kg)     Lab Results  Component Value Date   WBC 6.3  03/18/2020   HGB 13.1 03/18/2020   HCT 39.5 03/18/2020   PLT 273 03/18/2020   GLUCOSE 127 (H) 03/18/2020   CHOL 229 (H) 12/29/2019   TRIG 102.0 12/29/2019  HDL 50.80 12/29/2019   LDLCALC 157 (H) 12/29/2019   ALT 14 03/17/2020   AST 22 03/17/2020   NA 134 (L) 03/18/2020   K 4.1 03/18/2020   CL 98 03/18/2020   CREATININE 0.86 03/18/2020   BUN 21 03/18/2020   CO2 29 03/18/2020   TSH 2.120 03/17/2020   HGBA1C 6.0 12/29/2019       Assessment & Plan:   Problem List Items Addressed This Visit    Anxiety    Takes xanax.  Has decreased dose.  Discussed risk/side effects of long term anxiolytics.  Will continue to try to decrease.  Follow.       Aortic atherosclerosis ([place])    Intolerant to statin medication.  Low cholesterol diet.       Atrial fibrillation ([place])    Rated controlled.  On cardizem.  No increased heart rate or palpitations.  Follow. Continue eliquis.       Diaphragmatic hernia    S/p surgery as outlined previously.  She is eating. States she does well if chews food well and eats slowly.  [place].       Essential hypertension, benign    Continue cardizem and losartan.  Blood pressure doing well.  Follow pressures.  Follow metabolic panel.       History of CVA (cerebrovascular accident)    Continue eliquis and aspirin.        Hypercholesterolemia    Intolerant to statin medication. Low cholesterol diet and exercise.  Follow lipid panel.       Hyperglycemia    Low carb diet and exercise.  Follow met b and a1c.       Peripheral neuropathy    Uses a walker.  Feels is stable. No falls.  Follow.           Einar Pheasant, MD

## 2021-03-01 ENCOUNTER — Encounter: Payer: Self-pay | Admitting: Internal Medicine

## 2021-03-01 NOTE — Assessment & Plan Note (Signed)
Continue cardizem and losartan.  Blood pressure doing well.  Follow pressures.  Follow metabolic panel.   

## 2021-03-01 NOTE — Assessment & Plan Note (Signed)
Low carb diet and exercise.  Follow met b and a1c.  

## 2021-03-01 NOTE — Assessment & Plan Note (Signed)
Intolerant to statin medication. Low cholesterol diet and exercise.  Follow lipid panel.  

## 2021-03-01 NOTE — Assessment & Plan Note (Signed)
Uses a walker.  Feels is stable. No falls.  Follow.

## 2021-03-01 NOTE — Assessment & Plan Note (Signed)
Takes xanax.  Has decreased dose.  Discussed risk/side effects of long term anxiolytics.  Will continue to try to decrease.  Follow.

## 2021-03-01 NOTE — Assessment & Plan Note (Signed)
Rated controlled.  On cardizem.  No increased heart rate or palpitations.  Follow. Continue eliquis.

## 2021-03-01 NOTE — Assessment & Plan Note (Signed)
S/p surgery as outlined previously.  She is eating. States she does well if chews food well and eats slowly.  [place].

## 2021-03-01 NOTE — Assessment & Plan Note (Signed)
Continue eliquis and aspirin.   

## 2021-03-01 NOTE — Assessment & Plan Note (Signed)
Intolerant to statin medication.  Low cholesterol diet.  

## 2021-03-17 DIAGNOSIS — R935 Abnormal findings on diagnostic imaging of other abdominal regions, including retroperitoneum: Secondary | ICD-10-CM | POA: Diagnosis not present

## 2021-03-26 ENCOUNTER — Other Ambulatory Visit: Payer: Self-pay | Admitting: Internal Medicine

## 2021-03-26 NOTE — Telephone Encounter (Signed)
RX Refill:xanax Last Seen:02-27-21 Last ordered:01-08-21

## 2021-03-26 NOTE — Telephone Encounter (Signed)
rx ok'd for xanax #45 with one refill.

## 2021-03-27 ENCOUNTER — Telehealth: Payer: Self-pay | Admitting: Internal Medicine

## 2021-03-27 NOTE — Telephone Encounter (Signed)
Patient called in wanted to know if Dr.Scott would call her something for psoriasis she stated that she needed some now

## 2021-03-27 NOTE — Telephone Encounter (Signed)
Pt does not have dx of psoriasis. She says that she is better now as the day goes on. She used some cortisone cream. Advised if her symptoms persist she will need to be evaluated to determine best treatment options.

## 2021-03-31 ENCOUNTER — Encounter: Payer: Self-pay | Admitting: Adult Health

## 2021-03-31 ENCOUNTER — Other Ambulatory Visit: Payer: Self-pay

## 2021-03-31 ENCOUNTER — Ambulatory Visit (INDEPENDENT_AMBULATORY_CARE_PROVIDER_SITE_OTHER): Payer: Medicare Other | Admitting: Adult Health

## 2021-03-31 VITALS — BP 120/76 | HR 66 | Temp 97.0°F | Ht 60.0 in | Wt 179.0 lb

## 2021-03-31 DIAGNOSIS — I10 Essential (primary) hypertension: Secondary | ICD-10-CM | POA: Diagnosis not present

## 2021-03-31 DIAGNOSIS — L259 Unspecified contact dermatitis, unspecified cause: Secondary | ICD-10-CM

## 2021-03-31 LAB — COMPREHENSIVE METABOLIC PANEL
ALT: 12 U/L (ref 0–35)
AST: 18 U/L (ref 0–37)
Albumin: 4.1 g/dL (ref 3.5–5.2)
Alkaline Phosphatase: 67 U/L (ref 39–117)
BUN: 14 mg/dL (ref 6–23)
CO2: 31 mEq/L (ref 19–32)
Calcium: 9 mg/dL (ref 8.4–10.5)
Chloride: 97 mEq/L (ref 96–112)
Creatinine, Ser: 0.7 mg/dL (ref 0.40–1.20)
GFR: 76.82 mL/min (ref >60.00)
Glucose, Bld: 85 mg/dL (ref 70–99)
Potassium: 4.7 mEq/L (ref 3.5–5.1)
Sodium: 134 mEq/L — ABNORMAL LOW (ref 135–145)
Total Bilirubin: 0.4 mg/dL (ref 0.2–1.2)
Total Protein: 6.6 g/dL (ref 6.0–8.3)

## 2021-03-31 LAB — CBC WITH DIFFERENTIAL/PLATELET
Basophils Absolute: 0.1 10*3/uL (ref 0.0–0.1)
Basophils Relative: 1.1 % (ref 0.0–3.0)
Eosinophils Absolute: 0.4 10*3/uL (ref 0.0–0.7)
Eosinophils Relative: 7.7 % — ABNORMAL HIGH (ref 0.0–5.0)
HCT: 41.6 % (ref 36.0–46.0)
Hemoglobin: 13.9 g/dL (ref 12.0–15.0)
Lymphocytes Relative: 21.1 % (ref 12.0–46.0)
Lymphs Abs: 1.2 10*3/uL (ref 0.7–4.0)
MCHC: 33.5 g/dL (ref 30.0–36.0)
MCV: 92 fl (ref 78.0–100.0)
Monocytes Absolute: 0.8 10*3/uL (ref 0.1–1.0)
Monocytes Relative: 14 % — ABNORMAL HIGH (ref 3.0–12.0)
Neutro Abs: 3.2 10*3/uL (ref 1.4–7.7)
Neutrophils Relative %: 56.1 % (ref 43.0–77.0)
Platelets: 269 10*3/uL (ref 150.0–400.0)
RBC: 4.53 Mil/uL (ref 3.87–5.11)
RDW: 13.8 % (ref 11.5–15.5)
WBC: 5.7 10*3/uL (ref 4.0–10.5)

## 2021-03-31 LAB — TSH: TSH: 1.83 u[IU]/mL (ref 0.35–4.50)

## 2021-03-31 MED ORDER — TRIAMCINOLONE ACETONIDE 0.5 % EX CREA: TOPICAL_CREAM | Freq: Two times a day (BID) | CUTANEOUS | 0 refills | Status: AC

## 2021-03-31 NOTE — Patient Instructions (Addendum)
https://www.nhlbi.nih.gov/files/docs/public/heart/dash_brief.pdf">  DASH Eating Plan DASH stands for Dietary Approaches to Stop Hypertension. The DASH eating plan is a healthy eating plan that has been shown to:  Reduce high blood pressure (hypertension).  Reduce your risk for type 2 diabetes, heart disease, and stroke.  Help with weight loss. What are tips for following this plan? Reading food labels  Check food labels for the amount of salt (sodium) per serving. Choose foods with less than 5 percent of the Daily Value of sodium. Generally, foods with less than 300 milligrams (mg) of sodium per serving fit into this eating plan.  To find whole grains, look for the word "whole" as the first word in the ingredient list. Shopping  Buy products labeled as "low-sodium" or "no salt added."  Buy fresh foods. Avoid canned foods and pre-made or frozen meals. Cooking  Avoid adding salt when cooking. Use salt-free seasonings or herbs instead of table salt or sea salt. Check with your health care provider or pharmacist before using salt substitutes.  Do not fry foods. Cook foods using healthy methods such as baking, boiling, grilling, roasting, and broiling instead.  Cook with heart-healthy oils, such as olive, canola, avocado, soybean, or sunflower oil. Meal planning  Eat a balanced diet that includes: ? 4 or more servings of fruits and 4 or more servings of vegetables each day. Try to fill one-half of your plate with fruits and vegetables. ? 6-8 servings of whole grains each day. ? Less than 6 oz (170 g) of lean meat, poultry, or fish each day. A 3-oz (85-g) serving of meat is about the same size as a deck of cards. One egg equals 1 oz (28 g). ? 2-3 servings of low-fat dairy each day. One serving is 1 cup (237 mL). ? 1 serving of nuts, seeds, or beans 5 times each week. ? 2-3 servings of heart-healthy fats. Healthy fats called omega-3 fatty acids are found in foods such as walnuts,  flaxseeds, fortified milks, and eggs. These fats are also found in cold-water fish, such as sardines, salmon, and mackerel.  Limit how much you eat of: ? Canned or prepackaged foods. ? Food that is high in trans fat, such as some fried foods. ? Food that is high in saturated fat, such as fatty meat. ? Desserts and other sweets, sugary drinks, and other foods with added sugar. ? Full-fat dairy products.  Do not salt foods before eating.  Do not eat more than 4 egg yolks a week.  Try to eat at least 2 vegetarian meals a week.  Eat more home-cooked food and less restaurant, buffet, and fast food.   Lifestyle  When eating at a restaurant, ask that your food be prepared with less salt or no salt, if possible.  If you drink alcohol: ? Limit how much you use to:  0-1 drink a day for women who are not pregnant.  0-2 drinks a day for men. ? Be aware of how much alcohol is in your drink. In the [place] one drink equals one 12 oz bottle of beer (355 mL), one 5 oz glass of wine (148 mL), or one 1 oz glass of hard liquor (44 mL). General information  Avoid eating more than 2,300 mg of salt a day. If you have hypertension, you may need to reduce your sodium intake to 1,500 mg a day.  Work with your health care provider to maintain a healthy body weight or to lose weight. Ask what an ideal weight is for   you.  Get at least 30 minutes of exercise that causes your heart to beat faster (aerobic exercise) most days of the week. Activities may include walking, swimming, or biking.  Work with your health care provider or dietitian to adjust your eating plan to your individual calorie needs. What foods should I eat? Fruits All fresh, dried, or frozen fruit. Canned fruit in natural juice (without added sugar). Vegetables Fresh or frozen vegetables (raw, steamed, roasted, or grilled). Low-sodium or reduced-sodium tomato and vegetable juice. Low-sodium or reduced-sodium tomato sauce and tomato paste.  Low-sodium or reduced-sodium canned vegetables. Grains Whole-grain or whole-wheat bread. Whole-grain or whole-wheat pasta. Brown rice. Oatmeal. Quinoa. Bulgur. Whole-grain and low-sodium cereals. Pita bread. Low-fat, low-sodium crackers. Whole-wheat flour tortillas. Meats and other proteins Skinless chicken or turkey. Ground chicken or turkey. Pork with fat trimmed off. Fish and seafood. Egg whites. Dried beans, peas, or lentils. Unsalted nuts, nut butters, and seeds. Unsalted canned beans. Lean cuts of beef with fat trimmed off. Low-sodium, lean precooked or cured meat, such as sausages or meat loaves. Dairy Low-fat (1%) or fat-free (skim) milk. Reduced-fat, low-fat, or fat-free cheeses. Nonfat, low-sodium ricotta or cottage cheese. Low-fat or nonfat yogurt. Low-fat, low-sodium cheese. Fats and oils Soft margarine without trans fats. Vegetable oil. Reduced-fat, low-fat, or light mayonnaise and salad dressings (reduced-sodium). Canola, safflower, olive, avocado, soybean, and sunflower oils. Avocado. Seasonings and condiments Herbs. Spices. Seasoning mixes without salt. Other foods Unsalted popcorn and pretzels. Fat-free sweets. The items listed above may not be a complete list of foods and beverages you can eat. Contact a dietitian for more information. What foods should I avoid? Fruits Canned fruit in a light or heavy syrup. Fried fruit. Fruit in cream or butter sauce. Vegetables Creamed or fried vegetables. Vegetables in a cheese sauce. Regular canned vegetables (not low-sodium or reduced-sodium). Regular canned tomato sauce and paste (not low-sodium or reduced-sodium). Regular tomato and vegetable juice (not low-sodium or reduced-sodium). Pickles. Olives. Grains Baked goods made with fat, such as croissants, muffins, or some breads. Dry pasta or rice meal packs. Meats and other proteins Fatty cuts of meat. Ribs. Fried meat. Bacon. Bologna, salami, and other precooked or cured meats, such as  sausages or meat loaves. Fat from the back of a pig (fatback). Bratwurst. Salted nuts and seeds. Canned beans with added salt. Canned or smoked fish. Whole eggs or egg yolks. Chicken or turkey with skin. Dairy Whole or 2% milk, cream, and half-and-half. Whole or full-fat cream cheese. Whole-fat or sweetened yogurt. Full-fat cheese. Nondairy creamers. Whipped toppings. Processed cheese and cheese spreads. Fats and oils Butter. Stick margarine. Lard. Shortening. Ghee. Bacon fat. Tropical oils, such as coconut, palm kernel, or palm oil. Seasonings and condiments Onion salt, garlic salt, seasoned salt, table salt, and sea salt. Worcestershire sauce. Tartar sauce. Barbecue sauce. Teriyaki sauce. Soy sauce, including reduced-sodium. Steak sauce. Canned and packaged gravies. Fish sauce. Oyster sauce. Cocktail sauce. Store-bought horseradish. Ketchup. Mustard. Meat flavorings and tenderizers. Bouillon cubes. Hot sauces. Pre-made or packaged marinades. Pre-made or packaged taco seasonings. Relishes. Regular salad dressings. Other foods Salted popcorn and pretzels. The items listed above may not be a complete list of foods and beverages you should avoid. Contact a dietitian for more information. Where to find more information  National Heart, Lung, and Blood Institute: www.nhlbi.nih.gov  American Heart Association: www.heart.org  Academy of Nutrition and Dietetics: www.eatright.org  National Kidney Foundation: www.kidney.org Summary  The DASH eating plan is a healthy eating plan that has been shown to   reduce high blood pressure (hypertension). It may also reduce your risk for type 2 diabetes, heart disease, and stroke.  When on the DASH eating plan, aim to eat more fresh fruits and vegetables, whole grains, lean proteins, low-fat dairy, and heart-healthy fats.  With the DASH eating plan, you should limit salt (sodium) intake to 2,300 mg a day. If you have hypertension, you may need to reduce your  sodium intake to 1,500 mg a day.  Work with your health care provider or dietitian to adjust your eating plan to your individual calorie needs. This information is not intended to replace advice given to you by your health care provider. Make sure you discuss any questions you have with your health care provider. Document Revised: 10/20/2019 Document Reviewed: 10/20/2019 Elsevier Patient Education  2021 [place]. Blood Pressure Record Sheet To take your blood pressure, you will need a blood pressure machine. You can buy a blood pressure machine (blood pressure monitor) at your clinic, drug store, or online. When choosing one, consider:  An automatic monitor that has an arm cuff.  A cuff that wraps snugly around your upper arm. You should be able to fit only one finger between your arm and the cuff.  A device that stores blood pressure reading results.  Do not choose a monitor that measures your blood pressure from your wrist or finger. Follow your health care provider's instructions for how to take your blood pressure. To use this form:  Get one reading in the morning (a.m.) before you take any medicines.  Get one reading in the evening (p.m.) before supper.  Take at least 2 readings with each blood pressure check. This makes sure the results are correct. Wait 1-2 minutes between measurements.  Write down the results in the spaces on this form.  Repeat this once a week, or as told by your health care provider.  Make a follow-up appointment with your health care provider to discuss the results. Blood pressure log Date: _______________________  a.m. _____________________(1st reading) _____________________(2nd reading)  p.m. _____________________(1st reading) _____________________(2nd reading) Date: _______________________  a.m. _____________________(1st reading) _____________________(2nd reading)  p.m. _____________________(1st reading) _____________________(2nd  reading) Date: _______________________  a.m. _____________________(1st reading) _____________________(2nd reading)  p.m. _____________________(1st reading) _____________________(2nd reading) Date: _______________________  a.m. _____________________(1st reading) _____________________(2nd reading)  p.m. _____________________(1st reading) _____________________(2nd reading) Date: _______________________  a.m. _____________________(1st reading) _____________________(2nd reading)  p.m. _____________________(1st reading) _____________________(2nd reading) This information is not intended to replace advice given to you by your health care provider. Make sure you discuss any questions you have with your health care provider. Document Revised: 03/06/2020 Document Reviewed: 03/06/2020 Elsevier Patient Education  2021 [place]. Triamcinolone Aerosol, Cream, Lotion, Ointment What is this medicine? TRIAMCINOLONE (trye am SIN oh lone) is a corticosteroid. It is used on the skin to reduce swelling, redness, itching, and allergic reactions. This medicine may be used for other purposes; ask your health care provider or pharmacist if you have questions. COMMON BRAND NAME(S): Aristocort, Aristocort A, Aristocort HP, Cinalog, Cinolar, DERMASORB TA Complete, Flutex, Kenalog, Pediaderm TA, Sila III, SP Rx 228, Triacet, Trianex, Triderm What should I tell my health care provider before I take this medicine? They need to know if you have any of these conditions:  any active infection  large areas of burned or damaged skin  skin wasting or thinning  an unusual or allergic reaction to triamcinolone, corticosteroids, other medicines, foods, dyes, or preservatives  pregnant or trying to get pregnant  breast-feeding How should I use this medicine?  This medicine is for external use only. Do not take by mouth. Follow the directions on the prescription label. Wash your hands before and after use. Apply a  thin film of medicine to the affected area. Do not cover with a bandage or dressing unless your doctor or health care professional tells you to. Do not use on healthy skin or over large areas of skin. Do not get this medicine in your eyes. If you do, rinse out with plenty of cool tap water. It is important not to use more medicine than prescribed. Do not use your medicine more often than directed. Talk to your pediatrician regarding the use of this medicine in children. Special care may be needed. Elderly patients are more likely to have damaged skin through aging, and this may increase side effects. This medicine should only be used for brief periods and infrequently in older patients. Overdosage: If you think you have taken too much of this medicine contact a poison control center or emergency room at once. NOTE: This medicine is only for you. Do not share this medicine with others. What if I miss a dose? If you miss a dose, use it as soon as you can. If it is almost time for your next dose, use only that dose. Do not use double or extra doses. What may interact with this medicine? Interactions are not expected. This list may not describe all possible interactions. Give your health care provider a list of all the medicines, herbs, non-prescription drugs, or dietary supplements you use. Also tell them if you smoke, drink alcohol, or use illegal drugs. Some items may interact with your medicine. What should I watch for while using this medicine? Tell your doctor or health care professional if your symptoms do not start to get better within one week. Do not use for more than 14 days. Do not use on healthy skin or over large areas of skin. Tell your doctor or health care professional if you are exposed to anyone with measles or chickenpox, or if you develop sores or blisters that do not heal properly. Do not use an airtight bandage to cover the affected area unless your doctor or health care professional  tells you to. If you are to cover the area, follow the instructions carefully. Covering the area where the medicine is applied can increase the amount that passes through the skin and increases the risk of side effects. If treating the diaper area of a child, avoid covering the treated area with tight-fitting diapers or plastic pants. This may increase the amount of medicine that passes through the skin and increase the risk of serious side effects. What side effects may I notice from receiving this medicine? Side effects that you should report to your doctor or health care professional as soon as possible:  burning or itching of the skin  dark red spots on the skin  infection  painful, red, pus filled blisters in hair follicles  thinning of the skin, sunburn more likely especially on the face Side effects that usually do not require medical attention (report to your doctor or health care professional if they continue or are bothersome):  dry skin, irritation  unusual increased growth of hair on the face or body This list may not describe all possible side effects. Call your doctor for medical advice about side effects. You may report side effects to FDA at 1-800-FDA-1088. Where should I keep my medicine? Keep out of the reach of children. Store at  room temperature between 15 and 30 degrees C (59 and 86 degrees F). Do not freeze. Throw away any unused medicine after the expiration date. NOTE: This sheet is a summary. It may not cover all possible information. If you have questions about this medicine, talk to your doctor, pharmacist, or health care provider.  2021 Elsevier/Gold Standard (2020-09-26 11:04:44)  Psoriasis Psoriasis is a long-term (chronic) skin condition. It occurs because your body's defense system (immune system) causes skin cells to form too quickly. This causes raised, red patches (plaques) on your skin that look silvery. The patches may be on all areas of your body.  They can be any size or shape. Psoriasis can come and go. It can range from mild to very bad. It cannot be passed from one person to another (is not contagious). There is no cure for this condition, but it can be helped with treatment. What are the causes? The cause of psoriasis is not known. Some things can make it worse. These are:  Skin damage, such as cuts, scrapes, sunburn, and dryness.  Not getting enough sunlight.  Some medicines.  Alcohol.  Tobacco.  Stress.  Infections. What increases the risk?  Having a family member with psoriasis.  Being very overweight (obese).  Being 53-62 years old.  Taking certain medicines. What are the signs or symptoms? There are different types of psoriasis. The types are:  Plaque. This is the most common. Symptoms include red, raised patches with a silvery coating. These may be itchy. Your nails may be crumbly or fall off.  Guttate. Symptoms include small red spots on your stomach area, arms, and legs. These may happen after you have been sick, such as with strep throat.  Inverse. Symptoms include patches in your armpits, under your breasts, private areas, or on your butt.  Pustular. Symptoms include pus-filled bumps on the palms of your hands or the soles of your feet. You also may feel very tired, weak, have a fever, and not be hungry.  Erythrodermic. Symptoms include bright red skin that looks burned. You may have a fast heartbeat and a body temperature that is too high or too low. You may be itchy or in pain.  Sebopsoriasis. Symptoms include red patches on your scalp, forehead, and face that are greasy.  Psoriatic arthritis. Symptoms include swollen, painful joints along with scaly skin patches.   How is this treated? There is no cure for this condition, but treatment can:  Help your skin heal.  Lessen itching and irritation and swelling (inflammation).  Slow the growth of new skin cells.  Help your body's defense system  respond better to your skin. Treatment may include:  Creams or ointments.  Light therapy. This may include natural sunlight or light therapy in a doctor's office.  Medicines. These can help your body better manage skin cells. They may be used with light therapy or ointments. Medicines may include pills or injections. You may also get antibiotic medicines if you have an infection. Follow these instructions at home: Skin Care  Apply lotion to your skin as needed. Only use those that your doctor has said are okay.  Apply cool, wet cloths (cold compresses) to the affected areas.  Do not use a hot tub or take hot showers. Use slightly warm, not hot, water when taking showers and baths.  Do not scratch your skin. Lifestyle  Do not use any products that contain nicotine or tobacco, such as cigarettes, e-cigarettes, and chewing tobacco. If you need help  quitting, ask your doctor.  Lower your stress.  Keep a healthy weight.  Go out in the sun as told by your doctor. Do not get sunburned.  Join a support group.   Medicines  Take or use over-the-counter and prescription medicines only as told by your doctor.  If you were prescribed an antibiotic medicine, take it as told by your doctor. Do not stop using the antibiotic even if you start to feel better. Alcohol use If you drink alcohol:  Limit how much you use: ? 0-1 drink a day for women. ? 0-2 drinks a day for men.  Be aware of how much alcohol is in your drink. In the [place] one drink equals one 12 oz bottle of beer (355 mL), one 5 oz glass of wine (148 mL), or one 1 oz glass of hard liquor (44 mL). General instructions  Keep a journal to track the things that cause symptoms (triggers). Try to avoid these things.  See a counselor if you feel the support would help.  Keep all follow-up visits as told by your doctor. This is important. Contact a doctor if:  You have a fever.  Your pain gets worse.  You have more redness or  warmth in the affected areas.  You have new or worse pain or stiffness in your joints.  Your nails start to break easily or pull away from the nail bed.  You feel very sad (depressed). Summary  Psoriasis is a long-term (chronic) skin condition.  There is no cure for this condition, but treatment can help manage it.  Keep a journal to track the things that cause symptoms.  Take or use over-the-counter and prescription medicines only as told by your doctor.  Keep all follow-up visits as told by your doctor. This is important. This information is not intended to replace advice given to you by your health care provider. Make sure you discuss any questions you have with your health care provider. Document Revised: 09/20/2018 Document Reviewed: 09/20/2018 Elsevier Patient Education  2021 [place].  Contact Dermatitis Dermatitis is redness, soreness, and swelling (inflammation) of the skin. Contact dermatitis is a reaction to something that touches the skin. There are two types of contact dermatitis:  Irritant contact dermatitis. This happens when something bothers (irritates) your skin, like soap.  Allergic contact dermatitis. This is caused when you are exposed to something that you are allergic to, such as poison ivy. What are the causes?  Common causes of irritant contact dermatitis include: ? Makeup. ? Soaps. ? Detergents. ? Bleaches. ? Acids. ? Metals, such as nickel.  Common causes of allergic contact dermatitis include: ? Plants. ? Chemicals. ? Jewelry. ? Latex. ? Medicines. ? Preservatives in products, such as clothing. What increases the risk?  Having a job that exposes you to things that bother your skin.  Having asthma or eczema. What are the signs or symptoms? Symptoms may happen anywhere the irritant has touched your skin. Symptoms include:  Dry or flaky skin.  Redness.  Cracks.  Itching.  Pain or a burning feeling.  Blisters.  Blood or  clear fluid draining from skin cracks. With allergic contact dermatitis, swelling may occur. This may happen in places such as the eyelids, mouth, or genitals.   How is this treated?  This condition is treated by checking for the cause of the reaction and protecting your skin. Treatment may also include: ? Steroid creams, ointments, or medicines. ? Antibiotic medicines or other ointments, if you have  a skin infection. ? Lotion or medicines to help with itching. ? A bandage (dressing). Follow these instructions at home: Skin care  Moisturize your skin as needed.  Put cool cloths on your skin.  Put a baking soda paste on your skin. Stir water into baking soda until it looks like a paste.  Do not scratch your skin.  Avoid having things rub up against your skin.  Avoid the use of soaps, perfumes, and dyes. Medicines  Take or apply over-the-counter and prescription medicines only as told by your doctor.  If you were prescribed an antibiotic medicine, take or apply it as told by your doctor. Do not stop using it even if your condition starts to get better. Bathing  Take a bath with: ? Epsom salts. ? Baking soda. ? Colloidal oatmeal.  Bathe less often.  Bathe in warm water. Avoid using hot water. Bandage care  If you were given a bandage, change it as told by your health care provider.  Wash your hands with soap and water before and after you change your bandage. If soap and water are not available, use hand sanitizer. General instructions  Avoid the things that caused your reaction. If you do not know what caused it, keep a journal. Write down: ? What you eat. ? What skin products you use. ? What you drink. ? What you wear in the area that has symptoms. This includes jewelry.  Check the affected areas every day for signs of infection. Check for: ? More redness, swelling, or pain. ? More fluid or blood. ? Warmth. ? Pus or a bad smell.  Keep all follow-up visits as  told by your doctor. This is important. Contact a doctor if:  You do not get better with treatment.  Your condition gets worse.  You have signs of infection, such as: ? More swelling. ? Tenderness. ? More redness. ? Soreness. ? Warmth.  You have a fever.  You have new symptoms. Get help right away if:  You have a very bad headache.  You have neck pain.  Your neck is stiff.  You throw up (vomit).  You feel very sleepy.  You see red streaks coming from the area.  Your bone or joint near the area hurts after the skin has healed.  The area turns darker.  You have trouble breathing. Summary  Dermatitis is redness, soreness, and swelling of the skin.  Symptoms may occur where the irritant has touched you.  Treatment may include medicines and skin care.  If you do not know what caused your reaction, keep a journal.  Contact a doctor if your condition gets worse or you have signs of infection. This information is not intended to replace advice given to you by your health care provider. Make sure you discuss any questions you have with your health care provider. Document Revised: 03/08/2019 Document Reviewed: 06/01/2018 Elsevier Patient Education  [employer].

## 2021-03-31 NOTE — Progress Notes (Signed)
Acute Office Visit  Subjective:    [name], female    DOB: 14-Jun-1930, 87 y.o.   MRN: [id number]  Chief Complaint  Patient presents with  . Hypertension  . Psoriasis    Pt wants a check for psoriasis on her head and chest.     HPI Patient is in today for she has had some elevated blood pressures at home, feels she must be anxious or " something".   She has had some mild psoriasis in the past on her scalp it resolved with cream. She denies any new exposures to soap or laundry detergents or any new products. She has no burning or pain to rash. Does have occasional itching. She rash on her chest and on her base of neck.    Patient  denies any fever, body aches,chills, rash, chest pain, shortness of breath, nausea, vomiting, or diarrhea.  Denies dizziness, lightheadedness, pre syncopal or syncopal episodes.    Past Medical History:  Diagnosis Date  . Anxiety   . Collagen vascular disease ([place])   . Degenerative disc disease   . Depression   . GERD (gastroesophageal reflux disease)   . Hyperlipidemia   . Hypertension     Past Surgical History:  Procedure Laterality Date  . ABDOMINAL HYSTERECTOMY  1997   uterus and cervix removed, reports she still has her ovaries  . BREAST SURGERY  1960 to 1970   lumps removed  . CYSTOCELE REPAIR    . ESOPHAGOGASTRODUODENOSCOPY (EGD) WITH PROPOFOL N/A 10/03/2016   Procedure: ESOPHAGOGASTRODUODENOSCOPY (EGD) WITH PROPOFOL;  Surgeon: Manya Silvas, MD;  Location[facility] ENDOSCOPY;  Service: Endoscopy;  Laterality: N/A;  . RECTOCELE REPAIR      Family History  Problem Relation Age of Onset  . Heart disease Mother   . Arthritis Mother   . Ulcers Father        uremic poisoning  . Arthritis Sister        x 4  . Hyperlipidemia Sister        x 1  . Hypertension Sister   . Cancer Sister        x 2 (ovarian cancer) & 1 (colon cancer)  . Diabetes Sister   . Dementia Brother   . Dementia Brother     Social History    Socioeconomic History  . Marital status: Widowed    Spouse name: Not on file  . Number of children: Not on file  . Years of education: Not on file  . Highest education level: Not on file  Occupational History  . Not on file  Tobacco Use  . Smoking status: Never Smoker  . Smokeless tobacco: Never Used  Vaping Use  . Vaping Use: Never used  Substance and Sexual Activity  . Alcohol use: No    Alcohol/week: 0.0 standard drinks  . Drug use: No  . Sexual activity: Not Currently    Birth control/protection: None  Other Topics Concern  . Not on file  Social History Narrative  . Not on file   Social Determinants of Health   Financial Resource Strain: Not on file  Food Insecurity: Not on file  Transportation Needs: Not on file  Physical Activity: Not on file  Stress: Not on file  Social Connections: Not on file  Intimate Partner Violence: Not on file    Outpatient Medications Prior to Visit  Medication Sig Dispense Refill  . ALPRAZolam (XANAX) 0.5 MG tablet TAKE 1/2 TABLET BY MOUTH ONCE DAILY  AND 1 TABLET AT BEDTIME 45 tablet 1  . apixaban (ELIQUIS) 5 MG TABS tablet Take 1 tablet (5 mg total) by mouth 2 (two) times daily. 60 tablet 1  . aspirin EC 81 MG tablet Take 81 mg by mouth daily.    Marland Kitchen diltiazem (CARDIZEM CD) 120 MG 24 hr capsule Take 1 capsule (120 mg total) by mouth daily. 30 capsule 0  . famotidine (PEPCID) 20 MG tablet TAKE 1 TABLET BY MOUTH  DAILY 90 tablet 3  . losartan (COZAAR) 25 MG tablet Take 25 mg by mouth daily.    . Multiple Vitamin (MULTIVITAMIN) tablet Take 1 tablet by mouth daily.    . Alpha-Lipoic Acid 300 MG TABS Take 300 mg by mouth daily. (Patient not taking: Reported on 03/31/2021)     No facility-administered medications prior to visit.    Allergies  Allergen Reactions  . Celexa [Citalopram]   . Effexor [Venlafaxine]   . Lipitor [Atorvastatin]   . Macrobid [Nitrofurantoin Macrocrystal]   . Nortriptyline   . Penicillin V Potassium     Has  patient had a PCN reaction causing immediate rash, facial/tongue/throat swelling, SOB or lightheadedness with hypotension: Yes Has patient had a PCN reaction causing severe rash involving mucus membranes or skin necrosis: No Has patient had a PCN reaction that required hospitalization No Has patient had a PCN reaction occurring within the last 10 years: No If all of the above answers are "NO", then may proceed with Cephalosporin use.  . Prozac [Fluoxetine]   . Sulfa Antibiotics Rash    Review of Systems  Constitutional: Negative.   Respiratory: Negative.   Cardiovascular: Negative.   Gastrointestinal: Negative.   Musculoskeletal: Negative.   Skin: Positive for rash.  Psychiatric/Behavioral: Negative.        Objective:    Physical Exam Vitals and nursing note reviewed.  Constitutional:      Appearance: Normal appearance. She is not ill-appearing.  HENT:     Head: Normocephalic and atraumatic.     Right Ear: External ear normal.     Left Ear: External ear normal.     Nose: Nose normal.     Mouth/Throat:     Mouth: Mucous membranes are moist.  Eyes:     General: No scleral icterus.       Right eye: No discharge.        Left eye: No discharge.     Conjunctiva/sclera: Conjunctivae normal.     Pupils: Pupils are equal, round, and reactive to light.  Cardiovascular:     Rate and Rhythm: Normal rate and regular rhythm.     Pulses: Normal pulses.     Heart sounds: Normal heart sounds. No murmur heard. No friction rub. No gallop.   Pulmonary:     Effort: Pulmonary effort is normal. No respiratory distress.     Breath sounds: Normal breath sounds. No stridor. No wheezing, rhonchi or rales.  Chest:     Chest wall: No tenderness.  Abdominal:     General: Bowel sounds are normal. There is no distension.     Palpations: Abdomen is soft.     Tenderness: There is no abdominal tenderness. There is no guarding.  Genitourinary:    Comments: Deferred.  Musculoskeletal:         General: No tenderness. Normal range of motion.     Cervical back: Normal range of motion and neck supple.     Right lower leg: No edema.     Left lower leg:  No edema.  Skin:    General: Skin is warm.     Findings: Erythema present. No lesion or rash.          Comments: Faint erythema, dry patchy macular rash on chest and nape of neck as on diagram. No spreading erythema, no drainage. NO blisters or zoster appearing rash.    Neurological:     Mental Status: She is alert and oriented to person, place, and time.     Motor: No weakness.     Gait: Gait normal.  Psychiatric:        Mood and Affect: Mood normal.        Behavior: Behavior normal.        Thought Content: Thought content normal.        Judgment: Judgment normal.     BP 120/76 (BP Location: Left Arm, Patient Position: Sitting)   Pulse 66   Temp (!) 97 F (36.1 C)   Ht 5' (1.524 m)   Wt 179 lb (81.2 kg)   SpO2 99%   BMI 34.96 kg/m  Wt Readings from Last 3 Encounters:  03/31/21 179 lb (81.2 kg)  02/27/21 178 lb (80.7 kg)  10/18/20 172 lb (78 kg)    Health Maintenance Due  Topic Date Due  . COVID-19 Vaccine (1) Never done  . TETANUS/TDAP  Never done  . DEXA SCAN  Never done    There are no preventive care reminders to display for this patient.   Lab Results  Component Value Date   TSH 2.120 03/17/2020   Lab Results  Component Value Date   WBC 6.3 03/18/2020   HGB 13.1 03/18/2020   HCT 39.5 03/18/2020   MCV 91.2 03/18/2020   PLT 273 03/18/2020   Lab Results  Component Value Date   NA 134 (L) 03/18/2020   K 4.1 03/18/2020   CO2 29 03/18/2020   GLUCOSE 127 (H) 03/18/2020   BUN 21 03/18/2020   CREATININE 0.86 03/18/2020   BILITOT 0.8 03/17/2020   ALKPHOS 60 03/17/2020   AST 22 03/17/2020   ALT 14 03/17/2020   PROT 6.9 03/17/2020   ALBUMIN 4.0 03/17/2020   CALCIUM 8.7 (L) 03/18/2020   ANIONGAP 7 03/18/2020   GFR 69.72 12/29/2019   Lab Results  Component Value Date   CHOL 229 (H)  12/29/2019   Lab Results  Component Value Date   HDL 50.80 12/29/2019   Lab Results  Component Value Date   LDLCALC 157 (H) 12/29/2019   Lab Results  Component Value Date   TRIG 102.0 12/29/2019   Lab Results  Component Value Date   CHOLHDL 4 12/29/2019   Lab Results  Component Value Date   HGBA1C 6.0 12/29/2019       Assessment & Plan:   Problem List Items Addressed This Visit      Cardiovascular and Mediastinum   Essential hypertension, benign - Primary   Relevant Orders   TSH   CBC with Differential/Platelet   Comprehensive Metabolic Panel (CMET)    Other Visit Diagnoses    Contact dermatitis, unspecified contact dermatitis type, unspecified trigger       Relevant Medications   triamcinolone cream (KENALOG) 0.5 %     1. Essential hypertension, benign Bring blood pressure machine to next office visit for comparison since this is new blood pressure machine she has at home. Also keep log of readings.  - TSH - CBC with Differential/Platelet - Comprehensive Metabolic Panel (CMET)  2. Contact dermatitis, unspecified  contact dermatitis type, unspecified trigger Return if any new symptoms or concerns and call office.  - triamcinolone cream (KENALOG) 0.5 %; Apply topically 2 (two) times daily for 10 days. Apply thin layer  Dispense: 30 g; Refill: 0  Red Flags discussed. The patient was given clear instructions to go to ER or return to medical center if any red flags develop, symptoms do not improve, worsen or new problems develop. They verbalized understanding.   Meds ordered this encounter  Medications  . triamcinolone cream (KENALOG) 0.5 %    Sig: Apply topically 2 (two) times daily for 10 days. Apply thin layer    Dispense:  30 g    Refill:  0   No follow-ups on file.  Marcille Buffy, FNP

## 2021-04-01 NOTE — Progress Notes (Signed)
TSH for thyroid within normal limits. Sodium 134 consistent with previous labs otherwise CMP within normal limits. CBC ok eosinophils midly elevated likely due to allergies, monocytes consistent with previous labs no change.

## 2021-05-17 ENCOUNTER — Other Ambulatory Visit: Payer: Self-pay | Admitting: Internal Medicine

## 2021-05-20 NOTE — Telephone Encounter (Signed)
You can refuse daughter picked up the one sent 6/18.

## 2021-06-04 DIAGNOSIS — I700 Atherosclerosis of aorta: Secondary | ICD-10-CM | POA: Diagnosis not present

## 2021-06-27 ENCOUNTER — Other Ambulatory Visit: Payer: Self-pay | Admitting: Internal Medicine

## 2021-06-27 NOTE — Telephone Encounter (Signed)
RX Refill:xanax Last Seen:02-27-21 Last ordered:03-26-21

## 2021-06-28 NOTE — Telephone Encounter (Signed)
Rx ok'd for xanax #45 with one refill.

## 2021-07-28 ENCOUNTER — Other Ambulatory Visit: Payer: Self-pay | Admitting: Internal Medicine

## 2021-07-28 NOTE — Telephone Encounter (Signed)
RX Refill:xanax Last Seen:02-27-21 Last ordered:06-28-21

## 2021-07-29 DIAGNOSIS — H35372 Puckering of macula, left eye: Secondary | ICD-10-CM | POA: Diagnosis not present

## 2021-07-29 DIAGNOSIS — H40019 Open angle with borderline findings, low risk, unspecified eye: Secondary | ICD-10-CM | POA: Diagnosis not present

## 2021-07-29 DIAGNOSIS — H26492 Other secondary cataract, left eye: Secondary | ICD-10-CM | POA: Diagnosis not present

## 2021-07-29 DIAGNOSIS — H2511 Age-related nuclear cataract, right eye: Secondary | ICD-10-CM | POA: Diagnosis not present

## 2021-07-29 NOTE — Telephone Encounter (Signed)
Just refilled. Just picked up 30 day supply 07/28/21

## 2021-08-19 ENCOUNTER — Other Ambulatory Visit: Payer: Self-pay

## 2021-08-19 ENCOUNTER — Emergency Department: Payer: Medicare Other

## 2021-08-19 ENCOUNTER — Emergency Department
Admission: EM | Admit: 2021-08-19 | Discharge: 2021-08-19 | Disposition: A | Discharge status: Home / Self Care | Payer: Medicare Other | Attending: Emergency Medicine | Admitting: Emergency Medicine

## 2021-08-19 DIAGNOSIS — R000 Tachycardia, unspecified: Secondary | ICD-10-CM | POA: Diagnosis not present

## 2021-08-19 DIAGNOSIS — I4892 Unspecified atrial flutter: Secondary | ICD-10-CM | POA: Diagnosis not present

## 2021-08-19 DIAGNOSIS — Z7982 Long term (current) use of aspirin: Secondary | ICD-10-CM | POA: Diagnosis not present

## 2021-08-19 DIAGNOSIS — I10 Essential (primary) hypertension: Secondary | ICD-10-CM | POA: Diagnosis not present

## 2021-08-19 DIAGNOSIS — J9811 Atelectasis: Secondary | ICD-10-CM | POA: Diagnosis not present

## 2021-08-19 DIAGNOSIS — I517 Cardiomegaly: Secondary | ICD-10-CM | POA: Diagnosis not present

## 2021-08-19 DIAGNOSIS — R9431 Abnormal electrocardiogram [ECG] [EKG]: Secondary | ICD-10-CM | POA: Diagnosis not present

## 2021-08-19 DIAGNOSIS — Z79899 Other long term (current) drug therapy: Secondary | ICD-10-CM | POA: Diagnosis not present

## 2021-08-19 DIAGNOSIS — R531 Weakness: Secondary | ICD-10-CM | POA: Diagnosis not present

## 2021-08-19 DIAGNOSIS — Z7901 Long term (current) use of anticoagulants: Secondary | ICD-10-CM | POA: Insufficient documentation

## 2021-08-19 DIAGNOSIS — I483 Typical atrial flutter: Secondary | ICD-10-CM | POA: Insufficient documentation

## 2021-08-19 DIAGNOSIS — K449 Diaphragmatic hernia without obstruction or gangrene: Secondary | ICD-10-CM | POA: Diagnosis not present

## 2021-08-19 LAB — CBC
HCT: 46.7 % — ABNORMAL HIGH (ref 36.0–46.0)
Hemoglobin: 15.8 g/dL — ABNORMAL HIGH (ref 12.0–15.0)
MCH: 31.6 pg (ref 26.0–34.0)
MCHC: 33.8 g/dL (ref 30.0–36.0)
MCV: 93.4 fL (ref 80.0–100.0)
Platelets: 296 10*3/uL (ref 150–400)
RBC: 5 MIL/uL (ref 3.87–5.11)
RDW: 13.5 % (ref 11.5–15.5)
WBC: 7.6 10*3/uL (ref 4.0–10.5)
nRBC: 0 % (ref 0.0–0.2)

## 2021-08-19 LAB — URINALYSIS, COMPLETE (UACMP) WITH MICROSCOPIC
Bacteria, UA: NONE SEEN
Bilirubin Urine: NEGATIVE
Glucose, UA: NEGATIVE mg/dL
Hgb urine dipstick: NEGATIVE
Ketones, ur: NEGATIVE mg/dL
Leukocytes,Ua: NEGATIVE
Nitrite: NEGATIVE
Protein, ur: NEGATIVE mg/dL
Specific Gravity, Urine: 1.01 (ref 1.005–1.030)
WBC, UA: NONE SEEN WBC/hpf (ref 0–5)
pH: 7 (ref 5.0–8.0)

## 2021-08-19 LAB — BASIC METABOLIC PANEL
Anion gap: 8 (ref 5–15)
BUN: 14 mg/dL (ref 8–23)
CO2: 27 mmol/L (ref 22–32)
Calcium: 9.1 mg/dL (ref 8.9–10.3)
Chloride: 99 mmol/L (ref 98–111)
Creatinine, Ser: 0.74 mg/dL (ref 0.44–1.00)
GFR, Estimated: >60 mL/min (ref >60)
Glucose, Bld: 124 mg/dL — ABNORMAL HIGH (ref 70–99)
Potassium: 4.8 mmol/L (ref 3.5–5.1)
Sodium: 134 mmol/L — ABNORMAL LOW (ref 135–145)

## 2021-08-19 LAB — TROPONIN I (HIGH SENSITIVITY): Troponin I (High Sensitivity): 11 ng/L (ref <18)

## 2021-08-19 MED ORDER — DILTIAZEM HCL 25 MG/5ML IV SOLN
20.0000 mg | Freq: Once | INTRAVENOUS | Status: AC
  Administered 2021-08-19: 20 mg via INTRAVENOUS
  Filled 2021-08-19: qty 5

## 2021-08-19 MED ORDER — DILTIAZEM HCL ER COATED BEADS 120 MG PO CP24
120.0000 mg | ORAL_CAPSULE | Freq: Once | ORAL | Status: DC
  Filled 2021-08-19: qty 1

## 2021-08-19 NOTE — ED Provider Notes (Addendum)
[facility]  ____________________________________________   Event Date/Time   First MD Initiated Contact with Patient 08/19/21 1149     (approximate)  I have reviewed the triage vital signs and the nursing notes.   HISTORY  Chief Complaint Tachycardia    HPI JIZELLE CONKEY is a 87 y.o. female past medical history of atrial fibrillation on Eliquis, hypertension, hyperlipidemia, GERD, anxiety who presents with weakness.  Patient woke up this morning and had to urinate frequently.  She then just felt globally weak.  She checked her blood pressure and noticed that her heart rate was high so she came to the ED.  She denies chest pain or dyspnea.  Denies fevers, chills, nausea vomiting or abdominal pain.  She takes 120 mg of long-acting diltiazem which she took today.  She is compliant with her Eliquis.         Past Medical History:  Diagnosis Date   Anxiety    Collagen vascular disease ([place])    Degenerative disc disease    Depression    GERD (gastroesophageal reflux disease)    Hyperlipidemia    Hypertension     Patient Active Problem List   Diagnosis Date Noted   Atrial fibrillation with rapid ventricular response ([place]) 03/17/2020   Atrial fibrillation ([place]) 03/17/2020   History of CVA (cerebrovascular accident) 08/21/2019   Cystocele and rectocele with complete uterovaginal prolapse 04/12/2019   Aortic atherosclerosis ([place]) 05/15/2017   Paraesophageal hernia 12/31/2016   Diaphragmatic hernia 10/02/2016   Back skin lesion 03/15/2016   GERD (gastroesophageal reflux disease) 09/08/2015   Hyperglycemia 09/08/2015   Health care maintenance 03/10/2015   Peripheral neuropathy 02/18/2014   Vision changes 02/18/2014   Chronic back pain 05/06/2013   Anxiety 05/06/2013   Chronic anxiety 05/06/2013   Essential hypertension, benign 05/04/2013   Hypercholesterolemia 05/04/2013    Past Surgical History:  Procedure Laterality Date   ABDOMINAL  HYSTERECTOMY  1997   uterus and cervix removed, reports she still has her ovaries   [place] to 1970   lumps removed   CYSTOCELE REPAIR     ESOPHAGOGASTRODUODENOSCOPY (EGD) WITH PROPOFOL N/A 10/03/2016   Procedure: ESOPHAGOGASTRODUODENOSCOPY (EGD) WITH PROPOFOL;  Surgeon: Manya Silvas, MD;  Location: [facility];  Service: Endoscopy;  Laterality: N/A;   RECTOCELE REPAIR      Prior to Admission medications   Medication Sig Start Date End Date Taking? Authorizing Provider  Alpha-Lipoic Acid 300 MG TABS Take 300 mg by mouth daily. Patient not taking: Reported on 03/31/2021    [provider]  ALPRAZolam Duanne Moron) 0.5 MG tablet TAKE 1/2 TABLET BY MOUTH ONCE DAILY AND 1 TABLET AT BEDTIME 06/28/21   Einar Pheasant, MD  apixaban (ELIQUIS) 5 MG TABS tablet Take 1 tablet (5 mg total) by mouth 2 (two) times daily. 03/19/20   Dhungel, Flonnie Overman, MD  aspirin EC 81 MG tablet Take 81 mg by mouth daily.    [provider]  diltiazem (CARDIZEM CD) 120 MG 24 hr capsule Take 1 capsule (120 mg total) by mouth daily. 03/20/20   Dhungel, Flonnie Overman, MD  famotidine (PEPCID) 20 MG tablet TAKE 1 TABLET BY MOUTH  DAILY 01/31/21   Einar Pheasant, MD  losartan (COZAAR) 25 MG tablet Take 25 mg by mouth daily. 03/06/20   [provider]  Multiple Vitamin (MULTIVITAMIN) tablet Take 1 tablet by mouth daily.    [provider]    Allergies Celexa [citalopram], Effexor [venlafaxine], Lipitor [atorvastatin], Macrobid [nitrofurantoin macrocrystal],  Nortriptyline, Penicillin v potassium, Prozac [fluoxetine], and Sulfa antibiotics  Family History  Problem Relation Age of Onset   Heart disease Mother    Arthritis Mother    Ulcers Father        uremic poisoning   Arthritis Sister        x 4   Hyperlipidemia Sister        x 1   Hypertension Sister    Cancer Sister        x 2 (ovarian cancer) & 1 (colon cancer)   Diabetes Sister    Dementia Brother    Dementia Brother      Social History Social History   Tobacco Use   Smoking status: Never   Smokeless tobacco: Never  Vaping Use   Vaping Use: Never used  Substance Use Topics   Alcohol use: No    Alcohol/week: 0.0 standard drinks   Drug use: No    Review of Systems   Review of Systems  Constitutional:  Positive for fatigue. Negative for chills and fever.  Respiratory:  Negative for chest tightness and shortness of breath.   Cardiovascular:  Positive for palpitations. Negative for chest pain and leg swelling.  Gastrointestinal:  Negative for abdominal pain, nausea and vomiting.  Genitourinary:  Positive for frequency. Negative for difficulty urinating, dysuria and flank pain.  Neurological:  Positive for weakness. Negative for light-headedness.  All other systems reviewed and are negative.  Physical Exam Updated Vital Signs BP 95/67 (BP Location: Right Arm)   Pulse 74   Temp 99 F (37.2 C) (Oral)   Resp 16   SpO2 96%   Physical Exam Vitals and nursing note reviewed.  Constitutional:      General: She is not in acute distress.    Appearance: Normal appearance. She is not toxic-appearing.  HENT:     Head: Normocephalic and atraumatic.  Eyes:     General: No scleral icterus.    Conjunctiva/sclera: Conjunctivae normal.  Cardiovascular:     Rate and Rhythm: Tachycardia present. Rhythm irregular.  Pulmonary:     Effort: Pulmonary effort is normal. No respiratory distress.     Breath sounds: No stridor.  Abdominal:     General: Abdomen is flat. There is no distension.     Palpations: Abdomen is soft.     Tenderness: There is no abdominal tenderness. There is no guarding.  Musculoskeletal:        General: No deformity or signs of injury.     Cervical back: Normal range of motion.     Right lower leg: No edema.     Left lower leg: No edema.  Skin:    General: Skin is dry.     Coloration: Skin is not jaundiced or pale.  Neurological:     General: No focal deficit present.      Mental Status: She is alert and oriented to person, place, and time. Mental status is at baseline.  Psychiatric:        Mood and Affect: Mood normal.        Behavior: Behavior normal.     LABS (all labs ordered are listed, but only abnormal results are displayed)  Labs Reviewed  BASIC METABOLIC PANEL - Abnormal; Notable for the following components:      Result Value   Sodium 134 (*)    Glucose, Bld 124 (*)    All other components within normal limits  CBC - Abnormal; Notable for the following components:   Hemoglobin  15.8 (*)    HCT 46.7 (*)    All other components within normal limits  URINALYSIS, COMPLETE (UACMP) WITH MICROSCOPIC  TROPONIN I (HIGH SENSITIVITY)  TROPONIN I (HIGH SENSITIVITY)   ____________________________________________  EKG  Atrial flutter with a rate of 140, no acute ischemic changes ____________________________________________  RADIOLOGY Almeta Monas, personally viewed and evaluated these images (plain radiographs) as part of my medical decision making, as well as reviewing the written report by the radiologist.  ED MD interpretation: Chest x-ray which shows cardiomegaly and a hiatal hernia which is chronic    ____________________________________________   PROCEDURES  Procedure(s) performed (including Critical Care):  .Critical Care Performed by: Rada Hay, MD Authorized by: Rada Hay, MD   Critical care provider statement:    Critical care time (minutes):  45   Critical care was time spent personally by me on the following activities:  Discussions with consultants, evaluation of patient's response to treatment, examination of patient, ordering and performing treatments and interventions, ordering and review of laboratory studies, ordering and review of radiographic studies, pulse oximetry, re-evaluation of patient's condition, obtaining history from patient or surrogate and review of old  charts   ____________________________________________   INITIAL IMPRESSION / [place] / ED COURSE     87 year old female presents with episode of atrial fibrillation with RVR.  On initial evaluation patient's EKG shows flutter with a rate of 140.  She has history of A. fib she is anticoagulated on Eliquis is compliant.  Also takes long-acting calcium channel blocker which she is also compliant with.  Her only other symptom is urinary frequency we will check a UA and was malaise.  Her labs are all reassuring.  She has no chest pain dyspnea.  He was given 10 mg of diltiazem with adequate rate control, heart rate in the 70s.  Repeat EKG shows atrial flutter still rate controlled.  I discussed the case with Dr. Clayborn Bigness who recommends giving her a dose of 240 mg diltiazem extended release now and then increase her dose from 120 mg to to 40 mg.  Her blood pressure was in the 024O systolic so I gave her 973 mg of the diltiazem instead of 240.  Patient will increase her home dose.  She is otherwise stable for discharge.  Her and her daughter are comfortable with the plan.      ____________________________________________   FINAL CLINICAL IMPRESSION(S) / ED DIAGNOSES  Final diagnoses:  Typical atrial flutter [facility])     ED Discharge Orders     None        Note:  This document was prepared using Dragon voice recognition software and may include unintentional dictation errors.    Rada Hay, MD 08/19/21 1350    Rada Hay, MD 08/19/21 1409

## 2021-08-19 NOTE — Discharge Instructions (Signed)
Please start taking 240 mg of diltiazem which will be double of what you are taking before.  Please follow-up with Dr. Ubaldo Glassing in the next week.

## 2021-08-19 NOTE — ED Triage Notes (Signed)
C/O dizziness this morning.  Patient with history of afib.  Reports HR 140's since this morning.  Patient is AAOx3.  Skin warm and dry. NAD

## 2021-08-19 NOTE — ED Triage Notes (Signed)
Pt come with c/o tachycardia and weakness that started this am. Pt states hx of afib. Pt states she feels her heart is beating fast. Pt checked it at home and it was 144.  Pt denies any CP or SOB

## 2021-08-26 DIAGNOSIS — E7800 Pure hypercholesterolemia, unspecified: Secondary | ICD-10-CM | POA: Diagnosis not present

## 2021-08-26 DIAGNOSIS — Z8673 Personal history of transient ischemic attack (TIA), and cerebral infarction without residual deficits: Secondary | ICD-10-CM | POA: Diagnosis not present

## 2021-08-26 DIAGNOSIS — I700 Atherosclerosis of aorta: Secondary | ICD-10-CM | POA: Diagnosis not present

## 2021-08-26 DIAGNOSIS — R739 Hyperglycemia, unspecified: Secondary | ICD-10-CM | POA: Diagnosis not present

## 2021-08-26 DIAGNOSIS — I10 Essential (primary) hypertension: Secondary | ICD-10-CM | POA: Diagnosis not present

## 2021-08-26 DIAGNOSIS — I4891 Unspecified atrial fibrillation: Secondary | ICD-10-CM | POA: Diagnosis not present

## 2021-08-29 ENCOUNTER — Ambulatory Visit: Payer: Medicare Other | Admitting: Internal Medicine

## 2021-09-10 ENCOUNTER — Telehealth: Payer: Self-pay | Admitting: Internal Medicine

## 2021-09-10 NOTE — Telephone Encounter (Signed)
Patient stated that she would like to have a referral to dermatology for itchy scalp. She saw michelle for this in May. Does not have a preference- wants to stay in [place] and someone who takes her insurance. Ok to place referral?

## 2021-09-10 NOTE — Telephone Encounter (Signed)
Patient would like to go to a dermatologist. She needs someone that will take her insurance.

## 2021-09-11 ENCOUNTER — Other Ambulatory Visit: Payer: Self-pay | Admitting: Internal Medicine

## 2021-09-11 DIAGNOSIS — L299 Pruritus, unspecified: Secondary | ICD-10-CM

## 2021-09-11 NOTE — Progress Notes (Signed)
Order placed for dermatology referral.  

## 2021-09-11 NOTE — Telephone Encounter (Signed)
Order placed for dermatology referral.  

## 2021-09-16 ENCOUNTER — Other Ambulatory Visit: Payer: Self-pay | Admitting: Internal Medicine

## 2021-09-16 NOTE — Telephone Encounter (Signed)
RX Refill: xanax Last Seen: 02-27-21 Last Ordered: 06-28-21 Next Appt: NA

## 2021-09-16 NOTE — Telephone Encounter (Signed)
Refilled her xanax x 1.  Needs a f/u appt scheduled with me. Overdue.

## 2021-09-19 ENCOUNTER — Telehealth: Payer: Self-pay | Admitting: Internal Medicine

## 2021-09-19 NOTE — Telephone Encounter (Signed)
Spoken to patient, she stated she has had px on the right hip. She is having pain with no MOI. She stated she was cleaning and maybe lifted something too heavy. She stated the pain currently 2/10 after taking tylenol. Patient stated she can only sleep on her back and the pain was bothering her. She would like to have Mobic called in that seemed to help years ago. She believes it is her arthritis acting up. Patients doesn't have pain while walking with walker and sitting. Does have pain during position transitions and lying down in bed. Patient did say if she must she will go to [facility].She would rather have some mobic called in per patient.

## 2021-09-19 NOTE — Telephone Encounter (Signed)
Would recommend evaluation - to know how best to treat and what medication would work best.  [facility] - walk in.

## 2021-09-19 NOTE — Telephone Encounter (Signed)
Patient informed, Due to the high volume of calls and your symptoms we have to forward your call to our Triage Nurse to expedient your call. Please hold for the transfer.  Patient transferred to Access Nurse. Due to having pain in her hip the past few days and taking tylenol with no relief.She would like to know if her provider can call in Meloxicam.No openings in office or virtual for the patient to be evaluated.

## 2021-09-19 NOTE — Telephone Encounter (Signed)
Spoken to patient. She stated she wont go to [facility]. She stated she will just take tylenol for now. If sx become worse she did state she will go to the walk-in. She said it is too far for her to drive and haul around a wheelchair.

## 2021-10-24 ENCOUNTER — Other Ambulatory Visit: Payer: Self-pay | Admitting: Internal Medicine

## 2021-10-24 NOTE — Telephone Encounter (Signed)
LOV: 02/27/21 NOV: N/A

## 2021-10-25 NOTE — Telephone Encounter (Signed)
Rx ok'd for alprazolam #45 with no refills.  Needs a f/u appt scheduled.

## 2021-10-27 DIAGNOSIS — K5909 Other constipation: Secondary | ICD-10-CM | POA: Diagnosis not present

## 2021-10-27 DIAGNOSIS — K449 Diaphragmatic hernia without obstruction or gangrene: Secondary | ICD-10-CM | POA: Diagnosis not present

## 2021-10-27 DIAGNOSIS — R935 Abnormal findings on diagnostic imaging of other abdominal regions, including retroperitoneum: Secondary | ICD-10-CM | POA: Diagnosis not present

## 2021-12-01 ENCOUNTER — Other Ambulatory Visit: Payer: Self-pay | Admitting: Internal Medicine

## 2021-12-02 NOTE — Telephone Encounter (Signed)
Rx ok'd for xanax #45 with no refills.

## 2021-12-02 NOTE — Telephone Encounter (Signed)
Refilled: 10/25/2021 Last OV: 03/31/2021 Next OV: 12/03/2021

## 2021-12-03 ENCOUNTER — Encounter: Payer: Self-pay | Admitting: Internal Medicine

## 2021-12-03 ENCOUNTER — Ambulatory Visit (INDEPENDENT_AMBULATORY_CARE_PROVIDER_SITE_OTHER): Payer: Medicare Other | Admitting: Internal Medicine

## 2021-12-03 ENCOUNTER — Other Ambulatory Visit: Payer: Self-pay

## 2021-12-03 VITALS — BP 138/78 | HR 87 | Ht 60.0 in | Wt 168.0 lb

## 2021-12-03 DIAGNOSIS — M549 Dorsalgia, unspecified: Secondary | ICD-10-CM

## 2021-12-03 DIAGNOSIS — R739 Hyperglycemia, unspecified: Secondary | ICD-10-CM

## 2021-12-03 DIAGNOSIS — G8929 Other chronic pain: Secondary | ICD-10-CM

## 2021-12-03 DIAGNOSIS — E7800 Pure hypercholesterolemia, unspecified: Secondary | ICD-10-CM

## 2021-12-03 DIAGNOSIS — I10 Essential (primary) hypertension: Secondary | ICD-10-CM

## 2021-12-03 DIAGNOSIS — I700 Atherosclerosis of aorta: Secondary | ICD-10-CM

## 2021-12-03 DIAGNOSIS — K449 Diaphragmatic hernia without obstruction or gangrene: Secondary | ICD-10-CM | POA: Diagnosis not present

## 2021-12-03 DIAGNOSIS — G6289 Other specified polyneuropathies: Secondary | ICD-10-CM

## 2021-12-03 DIAGNOSIS — Z8673 Personal history of transient ischemic attack (TIA), and cerebral infarction without residual deficits: Secondary | ICD-10-CM | POA: Diagnosis not present

## 2021-12-03 DIAGNOSIS — I4891 Unspecified atrial fibrillation: Secondary | ICD-10-CM | POA: Diagnosis not present

## 2021-12-03 DIAGNOSIS — F419 Anxiety disorder, unspecified: Secondary | ICD-10-CM

## 2021-12-03 LAB — COMPREHENSIVE METABOLIC PANEL
ALT: 11 U/L (ref 0–35)
AST: 17 U/L (ref 0–37)
Albumin: 3.9 g/dL (ref 3.5–5.2)
Alkaline Phosphatase: 85 U/L (ref 39–117)
BUN: 14 mg/dL (ref 6–23)
CO2: 28 mEq/L (ref 19–32)
Calcium: 9.1 mg/dL (ref 8.4–10.5)
Chloride: 97 mEq/L (ref 96–112)
Creatinine, Ser: 0.72 mg/dL (ref 0.40–1.20)
GFR: 73.92 mL/min (ref >60.00)
Glucose, Bld: 94 mg/dL (ref 70–99)
Potassium: 4.6 mEq/L (ref 3.5–5.1)
Sodium: 133 mEq/L — ABNORMAL LOW (ref 135–145)
Total Bilirubin: 0.4 mg/dL (ref 0.2–1.2)
Total Protein: 6.7 g/dL (ref 6.0–8.3)

## 2021-12-03 LAB — CBC WITH DIFFERENTIAL/PLATELET
Basophils Absolute: 0 10*3/uL (ref 0.0–0.1)
Basophils Relative: 0.6 % (ref 0.0–3.0)
Eosinophils Absolute: 0.1 10*3/uL (ref 0.0–0.7)
Eosinophils Relative: 1.5 % (ref 0.0–5.0)
HCT: 41.5 % (ref 36.0–46.0)
Hemoglobin: 13.5 g/dL (ref 12.0–15.0)
Lymphocytes Relative: 21.4 % (ref 12.0–46.0)
Lymphs Abs: 1.2 10*3/uL (ref 0.7–4.0)
MCHC: 32.6 g/dL (ref 30.0–36.0)
MCV: 93.1 fl (ref 78.0–100.0)
Monocytes Absolute: 0.8 10*3/uL (ref 0.1–1.0)
Monocytes Relative: 13.7 % — ABNORMAL HIGH (ref 3.0–12.0)
Neutro Abs: 3.6 10*3/uL (ref 1.4–7.7)
Neutrophils Relative %: 62.8 % (ref 43.0–77.0)
Platelets: 304 10*3/uL (ref 150.0–400.0)
RBC: 4.45 Mil/uL (ref 3.87–5.11)
RDW: 13.6 % (ref 11.5–15.5)
WBC: 5.8 10*3/uL (ref 4.0–10.5)

## 2021-12-03 LAB — TSH: TSH: 1.43 u[IU]/mL (ref 0.35–5.50)

## 2021-12-03 NOTE — Assessment & Plan Note (Signed)
Intolerant to statin medication.  Low cholesterol diet.  

## 2021-12-03 NOTE — Assessment & Plan Note (Signed)
Rate controlled.  On cardizem.  No increased heart rate or palpitations.  Follow. Continue eliquis.

## 2021-12-03 NOTE — Assessment & Plan Note (Signed)
Uses a walker.  Stable. No pain.  Follow.   

## 2021-12-03 NOTE — Assessment & Plan Note (Signed)
Continue cardizem and losartan.  Blood pressure doing well.  Follow pressures.  Follow metabolic panel.   

## 2021-12-03 NOTE — Progress Notes (Signed)
[name], female   DOB: Jul 07, 1930, 88 y.o.   MRN: [id number]   Subjective:    [name], female    DOB: [birth year], 88 y.o.   MRN: [id number]  This visit occurred during the SARS-CoV-2 public health emergency.  Safety protocols were in place, including screening questions prior to the visit, additional usage of staff PPE, and extensive cleaning of exam room while observing appropriate contact time as indicated for disinfecting solutions.   Patient here for a scheduled follow up.    HPI Here to follow up regarding her blood pressure, anxiety and cholesterol.  She is accompanied by her daughter Sandra Reyes).   History obtained from both of them. She reports she feels she is doing relatively well.  Uses a walker to ambulate around her house.  No recent falls.  No chest pain.  No increased heart rate or palpitations.  Taking eliquis.  Heart rate controlled on diltiazem.  Breathing stable.  No increased cough or congestion.  No acid reflux.  No abdominal pain.  CT - large hiatal hernia.  Has seen GI and specialist - elected to monitor.  She is having to blend all of her food - to eat.  Feels is stable.  Handling stress.     Past Medical History:  Diagnosis Date   Anxiety    Collagen vascular disease ([place])    Degenerative disc disease    Depression    GERD (gastroesophageal reflux disease)    Hyperlipidemia    Hypertension    Past Surgical History:  Procedure Laterality Date   ABDOMINAL HYSTERECTOMY  1997   uterus and cervix removed, reports she still has her ovaries   BREAST SURGERY  1960 to 1970   lumps removed   CYSTOCELE REPAIR     ESOPHAGOGASTRODUODENOSCOPY (EGD) WITH PROPOFOL N/A 10/03/2016   Procedure: ESOPHAGOGASTRODUODENOSCOPY (EGD) WITH PROPOFOL;  Surgeon: Manya Silvas, MD;  Location: [facility];  Service: Endoscopy;  Laterality: N/A;   RECTOCELE REPAIR     Family History  Problem Relation Age of Onset   Heart disease Mother    Arthritis  Mother    Ulcers Father        uremic poisoning   Arthritis Sister        x 4   Hyperlipidemia Sister        x 1   Hypertension Sister    Cancer Sister        x 2 (ovarian cancer) & 1 (colon cancer)   Diabetes Sister    Dementia Brother    Dementia Brother    Social History   Socioeconomic History   Marital status: Widowed    Spouse name: Not on file   Number of children: Not on file   Years of education: Not on file   Highest education level: Not on file  Occupational History   Not on file  Tobacco Use   Smoking status: Never   Smokeless tobacco: Never  Vaping Use   Vaping Use: Never used  Substance and Sexual Activity   Alcohol use: No    Alcohol/week: 0.0 standard drinks   Drug use: No   Sexual activity: Not Currently    Birth control/protection: None  Other Topics Concern   Not on file  Social History Narrative   Not on file   Social Determinants of Health   Financial Resource Strain: Not on file  Food Insecurity: Not on file  Transportation Needs: Not on file  Physical Activity: Not on file  Stress: Not on file  Social Connections: Not on file    Review of Systems  Constitutional:  Negative for fever.       Eating - blends food.    HENT:  Negative for congestion and sinus pressure.   Respiratory:  Negative for cough, chest tightness and shortness of breath.   Cardiovascular:  Negative for chest pain, palpitations and leg swelling.  Gastrointestinal:  Negative for abdominal pain, diarrhea, nausea and vomiting.  Genitourinary:  Negative for difficulty urinating and dysuria.  Musculoskeletal:  Positive for back pain. Negative for joint swelling and myalgias.  Skin:  Negative for color change and rash.  Neurological:  Negative for dizziness, light-headedness and headaches.  Psychiatric/Behavioral:  Negative for agitation and dysphoric mood.       Objective:     BP 138/78    Pulse 87    Ht 5' (1.524 m)    Wt 168 lb (76.2 kg)    SpO2 97%    BMI 32.81  kg/m  Wt Readings from Last 3 Encounters:  12/03/21 168 lb (76.2 kg)  03/31/21 179 lb (81.2 kg)  02/27/21 178 lb (80.7 kg)    Physical Exam Vitals reviewed.  Constitutional:      General: She is not in acute distress.    Appearance: Normal appearance.  HENT:     Head: Normocephalic and atraumatic.     Right Ear: External ear normal.     Left Ear: External ear normal.  Eyes:     General: No scleral icterus.       Right eye: No discharge.        Left eye: No discharge.     Conjunctiva/sclera: Conjunctivae normal.  Neck:     Thyroid: No thyromegaly.  Cardiovascular:     Rate and Rhythm: Normal rate and regular rhythm.  Pulmonary:     Effort: No respiratory distress.     Breath sounds: Normal breath sounds. No wheezing.  Abdominal:     General: Bowel sounds are normal.     Palpations: Abdomen is soft.     Tenderness: There is no abdominal tenderness.  Musculoskeletal:        General: No swelling or tenderness.     Cervical back: Neck supple. No tenderness.  Lymphadenopathy:     Cervical: No cervical adenopathy.  Skin:    Findings: No erythema or rash.  Neurological:     Mental Status: She is alert.  Psychiatric:        Mood and Affect: Mood normal.        Behavior: Behavior normal.     Outpatient Encounter Medications as of 12/03/2021  Medication Sig   ALPRAZolam (XANAX) 0.5 MG tablet TAKE 1/2 TABLET BY MOUTH ONCE DAILY AND 1 TABLET AT BEDTIME   apixaban (ELIQUIS) 5 MG TABS tablet Take 1 tablet (5 mg total) by mouth 2 (two) times daily.   diltiazem (CARDIZEM CD) 120 MG 24 hr capsule Take 1 capsule (120 mg total) by mouth daily. (Patient taking differently: Take 120 mg by mouth daily. 2 tablets daily)   famotidine (PEPCID) 20 MG tablet TAKE 1 TABLET BY MOUTH  DAILY   losartan (COZAAR) 25 MG tablet Take 25 mg by mouth daily.   Multiple Vitamin (MULTIVITAMIN) tablet Take 2 tablets by mouth daily.   [DISCONTINUED] Alpha-Lipoic Acid 300 MG TABS Take 300 mg by mouth daily.  (Patient not taking: Reported on 03/31/2021)   [DISCONTINUED] aspirin EC 81 MG tablet  Take 81 mg by mouth daily. (Patient not taking: Reported on 12/03/2021)   No facility-administered encounter medications on file as of 12/03/2021.     Lab Results  Component Value Date   WBC 5.8 12/03/2021   HGB 13.5 12/03/2021   HCT 41.5 12/03/2021   PLT 304.0 12/03/2021   GLUCOSE 94 12/03/2021   CHOL 229 (H) 12/29/2019   TRIG 102.0 12/29/2019   HDL 50.80 12/29/2019   LDLCALC 157 (H) 12/29/2019   ALT 11 12/03/2021   AST 17 12/03/2021   NA 133 (L) 12/03/2021   K 4.6 12/03/2021   CL 97 12/03/2021   CREATININE 0.72 12/03/2021   BUN 14 12/03/2021   CO2 28 12/03/2021   TSH 1.43 12/03/2021   HGBA1C 6.0 12/29/2019    DG Chest 2 View  Result Date: 08/19/2021 CLINICAL DATA:  Tachycardia and weakness beginning today. EXAM: CHEST - 2 VIEW COMPARISON:  03/17/2020 FINDINGS: Chronically enlarged cardiac silhouette. Chronic very large hiatal hernia. Aortic atherosclerotic calcification. The lungs are clear except for atelectasis associated with the large hiatal hernia. No visible effusion. Upper lungs are clear. No acute bone finding. IMPRESSION: Cardiomegaly. Aortic atherosclerosis. Very large chronic hiatal hernia with associated volume loss at the lung bases. Electronically Signed   By: Nelson Chimes M.D.   On: 08/19/2021 12:26       Assessment & Plan:   Problem List Items Addressed This Visit     Anxiety    Takes xanax.  Has decreased dose.  Discussed risk/side effects of long term anxiolytics.  Discussed continuing to try to decrease dose - least effective dose.  Follow.       Aortic atherosclerosis ([place])    Intolerant to statin medication.  Low cholesterol diet.       Atrial fibrillation ([place])    Rate controlled.  On cardizem.  No increased heart rate or palpitations.  Follow. Continue eliquis.       Relevant Orders   CBC with Differential/Platelet (Completed)   Chronic back pain    Back is  stable.  Using a walker to ambulate.        Diaphragmatic hernia    S/p surgery.  She is eating - blending her food.  Just recently saw GI.  Stable.  Follow.        Essential hypertension, benign - Primary    Continue cardizem and losartan.  Blood pressure doing well.  Follow pressures.  Follow metabolic panel.        Relevant Orders   Comprehensive metabolic panel (Completed)   History of CVA (cerebrovascular accident)    Continue eliquis and aspirin.        Hypercholesterolemia    Intolerant to statin medication. Low cholesterol diet and exercise.  Follow lipid panel.       Relevant Orders   TSH (Completed)   Hyperglycemia    Low carb diet and exercise.  Follow met b and a1c.       Peripheral neuropathy    Uses a walker.  Stable. No pain.  Follow.          Einar Pheasant, MD

## 2021-12-03 NOTE — Assessment & Plan Note (Signed)
Intolerant to statin medication. Low cholesterol diet and exercise.  Follow lipid panel.  

## 2021-12-03 NOTE — Assessment & Plan Note (Signed)
Back is stable.  Using a walker to ambulate.

## 2021-12-03 NOTE — Assessment & Plan Note (Signed)
Continue eliquis and aspirin.   

## 2021-12-03 NOTE — Assessment & Plan Note (Signed)
Takes xanax.  Has decreased dose.  Discussed risk/side effects of long term anxiolytics.  Discussed continuing to try to decrease dose - least effective dose.  Follow.

## 2021-12-03 NOTE — Assessment & Plan Note (Signed)
Low carb diet and exercise.  Follow met b and a1c.

## 2021-12-03 NOTE — Assessment & Plan Note (Signed)
S/p surgery.  She is eating - blending her food.  Just recently saw GI.  Stable.  Follow.

## 2021-12-10 DIAGNOSIS — I10 Essential (primary) hypertension: Secondary | ICD-10-CM | POA: Diagnosis not present

## 2021-12-10 DIAGNOSIS — I700 Atherosclerosis of aorta: Secondary | ICD-10-CM | POA: Diagnosis not present

## 2021-12-10 DIAGNOSIS — I480 Paroxysmal atrial fibrillation: Secondary | ICD-10-CM | POA: Diagnosis not present

## 2022-01-05 ENCOUNTER — Other Ambulatory Visit: Payer: Self-pay | Admitting: Internal Medicine

## 2022-01-05 NOTE — Telephone Encounter (Signed)
Pt called in requesting refill on medication (ALPRAZolam (XANAX) 0.5 MG tablet). Pt stated that the pharmacist advise Pt to contact the office to make sure the request for refill is submitted. Pt requesting callback.

## 2022-02-03 ENCOUNTER — Other Ambulatory Visit: Payer: Self-pay | Admitting: Family

## 2022-02-03 ENCOUNTER — Telehealth: Payer: Self-pay | Admitting: Internal Medicine

## 2022-02-03 NOTE — Telephone Encounter (Signed)
Patient is requesting a refill on her ALPRAZolam (XANAX) 0.5 MG tablet. °

## 2022-02-03 NOTE — Telephone Encounter (Signed)
Patient had refill at pharmacy. ? ?

## 2022-02-04 NOTE — Telephone Encounter (Signed)
It appears this was just refilled 02/03/22.  Please confirm with pharmacy.  ?

## 2022-02-05 NOTE — Telephone Encounter (Signed)
confirmed

## 2022-03-10 ENCOUNTER — Other Ambulatory Visit: Payer: Self-pay

## 2022-03-10 ENCOUNTER — Telehealth: Payer: Self-pay | Admitting: Internal Medicine

## 2022-03-10 NOTE — Telephone Encounter (Signed)
Refill sent to Dr Nicki Reaper for approval ?

## 2022-03-10 NOTE — Telephone Encounter (Signed)
Patient is requesting a refill on her ALPRAZolam (XANAX) 0.5 MG tablet. °

## 2022-03-11 MED ORDER — ALPRAZOLAM 0.5 MG PO TABS: ORAL_TABLET | ORAL | 1 refills | Status: DC

## 2022-03-19 ENCOUNTER — Ambulatory Visit: Payer: Medicare Other | Admitting: Dermatology

## 2022-04-13 ENCOUNTER — Telehealth: Payer: Self-pay | Admitting: Internal Medicine

## 2022-04-13 ENCOUNTER — Other Ambulatory Visit: Payer: Self-pay

## 2022-04-13 NOTE — Telephone Encounter (Signed)
Pt need refill on ALPRAZolam sent to [facility] ?

## 2022-04-13 NOTE — Telephone Encounter (Signed)
Refill approval sent to Dr Nicki Reaper ?

## 2022-04-14 MED ORDER — ALPRAZOLAM 0.5 MG PO TABS: ORAL_TABLET | ORAL | 0 refills | Status: DC

## 2022-04-14 NOTE — Telephone Encounter (Signed)
Per PDMP review, she just refilled xanax 04/13/22.  I ok'd refill x 1.  This will get her through to her appt.  Discuss at appt regarding attempts to decrease amount taking.  ?

## 2022-05-11 ENCOUNTER — Telehealth: Payer: Self-pay | Admitting: Internal Medicine

## 2022-05-11 NOTE — Telephone Encounter (Signed)
Patient is requesting a refill on her ALPRAZolam (XANAX) 0.5 MG tablet. Pharmacy on file.

## 2022-05-12 MED ORDER — ALPRAZOLAM 0.5 MG PO TABS: ORAL_TABLET | ORAL | 1 refills | Status: DC

## 2022-05-12 NOTE — Telephone Encounter (Signed)
Rx ok'd for xanax #45 with one refill. PDMP reviewed.

## 2022-06-03 ENCOUNTER — Ambulatory Visit: Payer: Medicare Other | Admitting: Internal Medicine

## 2022-06-18 ENCOUNTER — Other Ambulatory Visit: Payer: Self-pay

## 2022-06-18 NOTE — Telephone Encounter (Signed)
Reviewed medications. Prescription for xanax was sent in on 05/12/22 #45 with one refill. Should have just picked up refill to last for the next month.  Does need f/u appt.  If the only way she can do appt now is by phone, ok to schedule phone visit.

## 2022-06-18 NOTE — Telephone Encounter (Signed)
Pt called in requesting refill on medication (ALPRAZolam (XANAX) 0.5 MG tablet)... Advised pt that she still have 1 refill left... Advised pt to call pharmacy for refill... Pt requesting callback.Marland KitchenMarland Kitchen

## 2022-06-19 NOTE — Telephone Encounter (Signed)
Pt advised Sched for 7/27 at 230pm for telephone f/u

## 2022-06-24 ENCOUNTER — Telehealth: Payer: Self-pay | Admitting: Internal Medicine

## 2022-06-24 NOTE — Telephone Encounter (Signed)
Lm for pt to cb.

## 2022-06-24 NOTE — Telephone Encounter (Signed)
Patient called and wanted to know what other antibiotics can she take besides cipro (?) and zpack.

## 2022-06-25 ENCOUNTER — Encounter: Payer: Self-pay | Admitting: Internal Medicine

## 2022-06-25 ENCOUNTER — Ambulatory Visit (INDEPENDENT_AMBULATORY_CARE_PROVIDER_SITE_OTHER): Payer: Medicare Other | Admitting: Internal Medicine

## 2022-06-25 DIAGNOSIS — I4891 Unspecified atrial fibrillation: Secondary | ICD-10-CM | POA: Diagnosis not present

## 2022-06-25 DIAGNOSIS — F419 Anxiety disorder, unspecified: Secondary | ICD-10-CM

## 2022-06-25 DIAGNOSIS — E7800 Pure hypercholesterolemia, unspecified: Secondary | ICD-10-CM

## 2022-06-25 DIAGNOSIS — G6289 Other specified polyneuropathies: Secondary | ICD-10-CM | POA: Diagnosis not present

## 2022-06-25 DIAGNOSIS — I700 Atherosclerosis of aorta: Secondary | ICD-10-CM | POA: Diagnosis not present

## 2022-06-25 DIAGNOSIS — R739 Hyperglycemia, unspecified: Secondary | ICD-10-CM | POA: Diagnosis not present

## 2022-06-25 DIAGNOSIS — Z8673 Personal history of transient ischemic attack (TIA), and cerebral infarction without residual deficits: Secondary | ICD-10-CM | POA: Diagnosis not present

## 2022-06-25 DIAGNOSIS — I10 Essential (primary) hypertension: Secondary | ICD-10-CM

## 2022-06-25 NOTE — Progress Notes (Signed)
[name], female   DOB: [birth year], 88 y.o.   MRN: [id number]   Virtual Visit via telephone Note  All issues noted in this document were discussed and addressed.  No physical exam was performed (except for noted visual exam findings with Video Visits).   I connected with Jeneva Schweizer today by telephone and verified that I am speaking with the correct person using two identifiers. Location patient: home Location provider: work  Persons participating in the telephone visit: patient, provider  The limitations, risks, security and privacy concerns of performing an evaluation and management service by telephone and the availability of in person appointments have been discussed.  It has also been discussed with the patient that there may be a patient responsible charge related to this service. The patient expressed understanding and agreed to proceed.   Reason for visit: follow up appt  HPI: Follow up regarding hypertension, cholesterol and increased stress.  Overall she reports she is doing relatively well.  Saw Dr Corky Sox 12/10/21 - f/u afib/aflutter.  On diltiazem and eliquis.  Also on losartan for blood pressure.  Stable.  No changes made.  Had tooth pulled.  Has history of significant hiatal hernia.  Per note, not a good surgical candidate.  She is eating pureed food.  Tolerating.  No vomiting.  Takes ex lax q 2-3 days to keep bowels moving.  Denies any increased heart rate or palpitations.  Blood pressures doing well - ranging 109-130s/60-70.  Hands stiff.  Left thumb - right hand.  Takes tylenol prn.     ROS: See pertinent positives and negatives per HPI.  Past Medical History:  Diagnosis Date   Anxiety    Collagen vascular disease ([place])    Degenerative disc disease    Depression    GERD (gastroesophageal reflux disease)    Hyperlipidemia    Hypertension     Past Surgical History:  Procedure Laterality Date   ABDOMINAL HYSTERECTOMY  1997   uterus and cervix removed,  reports she still has her ovaries   BREAST SURGERY  1960 to 1970   lumps removed   CYSTOCELE REPAIR     ESOPHAGOGASTRODUODENOSCOPY (EGD) WITH PROPOFOL N/A 10/03/2016   Procedure: ESOPHAGOGASTRODUODENOSCOPY (EGD) WITH PROPOFOL;  Surgeon: Manya Silvas, MD;  Location: [facility];  Service: Endoscopy;  Laterality: N/A;   RECTOCELE REPAIR      Family History  Problem Relation Age of Onset   Heart disease Mother    Arthritis Mother    Ulcers Father        uremic poisoning   Arthritis Sister        x 4   Hyperlipidemia Sister        x 1   Hypertension Sister    Cancer Sister        x 2 (ovarian cancer) & 1 (colon cancer)   Diabetes Sister    Dementia Brother    Dementia Brother     SOCIAL HX: reviewed.    Current Outpatient Medications:    ALPRAZolam (XANAX) 0.5 MG tablet, TAKE 1/2 TABLET BY MOUTH ONCE DAILY AND 1 TABLET AT BEDTIME, Disp: 45 tablet, Rfl: 1   apixaban (ELIQUIS) 5 MG TABS tablet, Take 1 tablet (5 mg total) by mouth 2 (two) times daily., Disp: 60 tablet, Rfl: 1   diltiazem (CARDIZEM CD) 120 MG 24 hr capsule, Take 1 capsule (120 mg total) by mouth daily. (Patient taking differently: Take 120 mg by mouth daily. 2 tablets daily), Disp: 30  capsule, Rfl: 0   famotidine (PEPCID) 20 MG tablet, TAKE 1 TABLET BY MOUTH  DAILY, Disp: 90 tablet, Rfl: 3   losartan (COZAAR) 25 MG tablet, Take 25 mg by mouth daily., Disp: , Rfl:    Multiple Vitamin (MULTIVITAMIN) tablet, Take 2 tablets by mouth daily. Uses 2 gummies daily, Disp: , Rfl:   EXAM:  VITALS per patient if applicable: 700/52  GENERAL: alert. Sounds to be in no acute distress.  Answering questions appropriately.   PSYCH/NEURO: pleasant and cooperative, no obvious depression or anxiety, speech and thought processing grossly intact  ASSESSMENT AND PLAN:  Discussed the following assessment and plan:  Problem List Items Addressed This Visit     Anxiety    Takes xanax.  Have discussed risk/side effects of long  term anxiolytics.  Discussed continuing to try to decrease dose - least effective dose.  Follow.       Aortic atherosclerosis ([place])    Intolerant to statin medication.  Low cholesterol diet.       Atrial fibrillation ([place])    On diltiazem and eliquis.  Denies increased heart rate or palpitations.  Follow.       Essential hypertension, benign    Continue cardizem and losartan.  Blood pressure doing well.  Follow pressures.  Follow metabolic panel.        Relevant Orders   Basic metabolic panel   History of CVA (cerebrovascular accident)    Continue eliquis and aspirin.        Hypercholesterolemia    Intolerant to statin medication. Low cholesterol diet and exercise.  Follow lipid panel.       Relevant Orders   CBC with Differential/Platelet   Hepatic function panel   Lipid panel   Hyperglycemia    Low carb diet and exercise.  Follow met b and a1c.       Relevant Orders   Hemoglobin A1c   Peripheral neuropathy    Uses a walker.  Stable. No pain.  Follow.         Return in about 3 months (around 09/25/2022) for follow up appt (30 min).   I discussed the assessment and treatment plan with the patient. The patient was provided an opportunity to ask questions and all were answered. The patient agreed with the plan and demonstrated an understanding of the instructions.   The patient was advised to call back or seek an in-person evaluation if the symptoms worsen or if the condition fails to improve as anticipated.  I provided 23 minutes of non-face-to-face time during this encounter.   Einar Pheasant, MD

## 2022-06-26 ENCOUNTER — Telehealth: Payer: Self-pay | Admitting: Internal Medicine

## 2022-06-26 NOTE — Telephone Encounter (Signed)
Called pt to schedule 3 months follow up... Pt request labs... No labs in system... Pt requesting to have labs on 3 months appt... Pt requesting callback

## 2022-06-28 NOTE — Assessment & Plan Note (Signed)
On diltiazem and eliquis.  Denies increased heart rate or palpitations.  Follow.  

## 2022-06-28 NOTE — Assessment & Plan Note (Signed)
Continue eliquis and aspirin.   

## 2022-06-28 NOTE — Assessment & Plan Note (Signed)
Intolerant to statin medication.  Low cholesterol diet.  

## 2022-06-28 NOTE — Assessment & Plan Note (Signed)
Takes xanax.  Have discussed risk/side effects of long term anxiolytics.  Discussed continuing to try to decrease dose - least effective dose.  Follow.

## 2022-06-28 NOTE — Assessment & Plan Note (Signed)
Uses a walker.  Stable. No pain.  Follow.   

## 2022-06-28 NOTE — Assessment & Plan Note (Signed)
Intolerant to statin medication. Low cholesterol diet and exercise.  Follow lipid panel.  

## 2022-06-28 NOTE — Assessment & Plan Note (Signed)
Low carb diet and exercise.  Follow met b and a1c.  

## 2022-06-28 NOTE — Assessment & Plan Note (Signed)
Continue cardizem and losartan.  Blood pressure doing well.  Follow pressures.  Follow metabolic panel.

## 2022-06-29 NOTE — Telephone Encounter (Signed)
Patient states she missed a call from [place] today and she is calling us back.  I let her know that I don't see a message in the system indicating anyone called her this morning, but I was able to follow-up on previous call regarding labs.  I let patient know that the lab orders are in.  Patient states she was told that she would be able to eat a light breakfast and then have her labs drawn around lunch time.  I let her know that it looks like we have orders for fasting labs, and usually patients do not eat after midnight the night before, but they can have water and plain black coffee and are able to take their medicine.  I let her know that I will send a note to Dr. Randell Patient Scott's CMA to clarify.  Also, patient states she would like to have her labs drawn when she comes for her appointment, so she will only have to make one trip here.

## 2022-06-30 DIAGNOSIS — I10 Essential (primary) hypertension: Secondary | ICD-10-CM | POA: Diagnosis not present

## 2022-07-06 ENCOUNTER — Other Ambulatory Visit (INDEPENDENT_AMBULATORY_CARE_PROVIDER_SITE_OTHER): Payer: Medicare Other

## 2022-07-06 ENCOUNTER — Encounter: Payer: Self-pay | Admitting: Internal Medicine

## 2022-07-06 ENCOUNTER — Ambulatory Visit (INDEPENDENT_AMBULATORY_CARE_PROVIDER_SITE_OTHER): Payer: Medicare Other | Admitting: Internal Medicine

## 2022-07-06 ENCOUNTER — Telehealth: Payer: Self-pay | Admitting: Internal Medicine

## 2022-07-06 DIAGNOSIS — I10 Essential (primary) hypertension: Secondary | ICD-10-CM | POA: Diagnosis not present

## 2022-07-06 DIAGNOSIS — N813 Complete uterovaginal prolapse: Secondary | ICD-10-CM

## 2022-07-06 DIAGNOSIS — N949 Unspecified condition associated with female genital organs and menstrual cycle: Secondary | ICD-10-CM

## 2022-07-06 DIAGNOSIS — R300 Dysuria: Secondary | ICD-10-CM | POA: Diagnosis not present

## 2022-07-06 LAB — POCT URINALYSIS DIPSTICK
Bilirubin, UA: NEGATIVE
Glucose, UA: NEGATIVE
Ketones, UA: NEGATIVE
Nitrite, UA: NEGATIVE
Spec Grav, UA: 1.01 (ref 1.010–1.025)
Urobilinogen, UA: 0.2 E.U./dL
pH, UA: 7 (ref 5.0–8.0)

## 2022-07-06 NOTE — Telephone Encounter (Signed)
Noted. Can you place orders thanks

## 2022-07-06 NOTE — Telephone Encounter (Signed)
Ok.  Will need urine sample.  Can she drop off urine and do phone call?

## 2022-07-06 NOTE — Telephone Encounter (Signed)
Orders placed.

## 2022-07-06 NOTE — Telephone Encounter (Signed)
S/w pt - will come give urine sample around 330/345 (has to wait for daughter to be able to bring her) and agreeable to phone call at 430. Pt added to sched.

## 2022-07-06 NOTE — Telephone Encounter (Signed)
  S/w pt - unsure if UTI  Pt stated thinks she is feeling irritation due to dropped bladder. Stated no burning, no frequency, no chills, no fever. Wants to drop urine off to make sure no UTI .  Stated has been trying to get a pessary placed - but unable to get appointment.  Does not want to come into office. Does not want to see another provider. Agreeable to telephone call visit.  Ok to add on to end of day?

## 2022-07-06 NOTE — Telephone Encounter (Signed)
Patient is requesting to drop off a urine for a UTI. She states she feels some burning. However, she does not want to see another provider.

## 2022-07-06 NOTE — Progress Notes (Signed)
[name], female   DOB: 28-Nov-1930, 88 y.o.   MRN: [id number]   Virtual Visit via telephone Note  All issues noted in this document were discussed and addressed.  No physical exam was performed (except for noted visual exam findings with Video Visits).   I connected with Sandra Reyes by telephone and verified that I am speaking with the correct person using two identifiers. Location patient: home Location provider: work  Persons participating in the telephone visit: patient, provider  The limitations, risks, security and privacy concerns of performing an evaluation and management service by telephone and the availability of in person appointments have been discussed.  It has also been discussed with the patient that there may be a patient responsible charge related to this service. The patient expressed understanding and agreed to proceed.   Reason for visit: work in appt  HPI: Work in with concern regarding a possible UTI. Had noticed sore inside vagina.  Woke - with some burning.  Some nocturia.  No dysuria.  No abdominal pressure.  No fever or chills.  No hematuria.  No back pain.  No vaginal itching or discharge.  Had a pessary a couple of years ago.  Would like to be reevaluated.  No nausea or vomiting.  Eating and drinking.  Has called gyn for appt.    ROS: See pertinent positives and negatives per HPI.  Past Medical History:  Diagnosis Date   Anxiety    Collagen vascular disease ([place])    Degenerative disc disease    Depression    GERD (gastroesophageal reflux disease)    Hyperlipidemia    Hypertension     Past Surgical History:  Procedure Laterality Date   ABDOMINAL HYSTERECTOMY  1997   uterus and cervix removed, reports she still has her ovaries   BREAST SURGERY  1960 to 1970   lumps removed   CYSTOCELE REPAIR     ESOPHAGOGASTRODUODENOSCOPY (EGD) WITH PROPOFOL N/A 10/03/2016   Procedure: ESOPHAGOGASTRODUODENOSCOPY (EGD) WITH PROPOFOL;  Surgeon: Manya Silvas, MD;  Location: [facility];  Service: Endoscopy;  Laterality: N/A;   RECTOCELE REPAIR      Family History  Problem Relation Age of Onset   Heart disease Mother    Arthritis Mother    Ulcers Father        uremic poisoning   Arthritis Sister        x 4   Hyperlipidemia Sister        x 1   Hypertension Sister    Cancer Sister        x 2 (ovarian cancer) & 1 (colon cancer)   Diabetes Sister    Dementia Brother    Dementia Brother     SOCIAL HX: reviewed.    Current Outpatient Medications:    ALPRAZolam (XANAX) 0.5 MG tablet, TAKE 1/2 TABLET BY MOUTH ONCE DAILY AND 1 TABLET AT BEDTIME, Disp: 45 tablet, Rfl: 1   apixaban (ELIQUIS) 5 MG TABS tablet, Take 1 tablet (5 mg total) by mouth 2 (two) times daily., Disp: 60 tablet, Rfl: 1   diltiazem (CARDIZEM CD) 120 MG 24 hr capsule, Take 1 capsule (120 mg total) by mouth daily. (Patient taking differently: Take 120 mg by mouth daily. 2 tablets daily), Disp: 30 capsule, Rfl: 0   famotidine (PEPCID) 20 MG tablet, TAKE 1 TABLET BY MOUTH  DAILY, Disp: 90 tablet, Rfl: 3   losartan (COZAAR) 25 MG tablet, Take 25 mg by mouth daily., Disp: , Rfl:  Multiple Vitamin (MULTIVITAMIN) tablet, Take 2 tablets by mouth daily. Uses 2 gummies daily, Disp: , Rfl:   EXAM:  VITALS per patient if applicable:  GENERAL: alert. Sounds to be in no acute distress. Answering questions appropriately.   PSYCH/NEURO: pleasant and cooperative, no obvious depression or anxiety, speech and thought processing grossly intact  ASSESSMENT AND PLAN:  Discussed the following assessment and plan:  Problem List Items Addressed This Visit     Cystocele and rectocele with complete uterovaginal prolapse    Has seen gyn previously.  Previous pessary.  Has called GYN for appt.  Confirm appt.       Essential hypertension, benign    Continue cardizem and losartan.  Follow pressures.  Follow metabolic panel.        Vaginal discomfort    Noticed some discomfort  with urination.  No actual dysuria.  No vaginal discharge or itching.  Urine dip reviewed.  After discussion, will hold on abx.  Follow.  Await culture results.  Has called gyn for f/u.         No follow-ups on file.   I discussed the assessment and treatment plan with the patient. The patient was provided an opportunity to ask questions and all were answered. The patient agreed with the plan and demonstrated an understanding of the instructions.   The patient was advised to call back or seek an in-person evaluation if the symptoms worsen or if the condition fails to improve as anticipated.  I provided 22 minutes of non-face-to-face time during this encounter.   Einar Pheasant, MD

## 2022-07-07 LAB — URINE CULTURE
MICRO NUMBER:: 13744742
SPECIMEN QUALITY:: ADEQUATE

## 2022-07-07 LAB — URINALYSIS, MICROSCOPIC ONLY

## 2022-07-09 ENCOUNTER — Telehealth: Payer: Self-pay

## 2022-07-09 NOTE — Telephone Encounter (Signed)
Pt daughter sherry called in requesting results and medication... Pt daughter requesting callback at [phone]

## 2022-07-09 NOTE — Telephone Encounter (Signed)
Patient states she would like to know the results of her urine test.

## 2022-07-10 NOTE — Telephone Encounter (Signed)
S/w Judeen Hammans - advised no infec  Per Judeen Hammans - no increased sx, pt feeling same as she did. Has appt for pessary.

## 2022-07-12 ENCOUNTER — Encounter: Payer: Self-pay | Admitting: Internal Medicine

## 2022-07-12 DIAGNOSIS — N949 Unspecified condition associated with female genital organs and menstrual cycle: Secondary | ICD-10-CM | POA: Insufficient documentation

## 2022-07-12 NOTE — Assessment & Plan Note (Signed)
Has seen gyn previously.  Previous pessary.  Has called GYN for appt.  Confirm appt.

## 2022-07-12 NOTE — Assessment & Plan Note (Signed)
Noticed some discomfort with urination.  No actual dysuria.  No vaginal discharge or itching.  Urine dip reviewed.  After discussion, will hold on abx.  Follow.  Await culture results.  Has called gyn for f/u.

## 2022-07-12 NOTE — Assessment & Plan Note (Signed)
Continue cardizem and losartan.  Follow pressures.  Follow metabolic panel.    

## 2022-07-13 ENCOUNTER — Telehealth: Payer: Self-pay | Admitting: Internal Medicine

## 2022-07-13 NOTE — Telephone Encounter (Signed)
Spoke with pt and she stated that Dr. Alease Medina office called her back and scheduled her for tomorrow morning at 9:45 to have a pessary put in. Pt stated that she does not need an appt at our office now.

## 2022-07-13 NOTE — Telephone Encounter (Signed)
Patient just called back and stated that her other provider is going to call her in something to help.

## 2022-07-13 NOTE — Telephone Encounter (Signed)
We just checked her urine - negative culture.  Needs to be evaluated to determine what is needed.

## 2022-07-13 NOTE — Telephone Encounter (Signed)
Patient called stating that her bladder is hanging out and she is burning.  Patient would like to know if the provider can call her in something for the burning.

## 2022-07-14 ENCOUNTER — Ambulatory Visit: Payer: Medicare Other | Admitting: Obstetrics & Gynecology

## 2022-07-14 ENCOUNTER — Encounter: Payer: Self-pay | Admitting: Obstetrics & Gynecology

## 2022-07-14 VITALS — BP 140/80 | Ht 64.0 in | Wt 155.0 lb

## 2022-07-14 DIAGNOSIS — N993 Prolapse of vaginal vault after hysterectomy: Secondary | ICD-10-CM | POA: Diagnosis not present

## 2022-07-14 DIAGNOSIS — N949 Unspecified condition associated with female genital organs and menstrual cycle: Secondary | ICD-10-CM

## 2022-07-14 DIAGNOSIS — Z4689 Encounter for fitting and adjustment of other specified devices: Secondary | ICD-10-CM

## 2022-07-14 NOTE — Progress Notes (Signed)
   Subjective:    [name], female    DOB: 1930/08/30, 88 y.o.   MRN: [id number]  HPI 88 yo widowed P4 here as a work in for a pessary fitting. She has uses a pessary in the distant past but not in the last few years. She began having significant vulvovaginal "burning" in the last 2 weeks which she attributes to the vaginal prolapse. She reports that she has no problems with voiding or having BMs.   Review of Systems She lives at home with her daughter.     Objective:   Physical Exam Well nourished, well hydrated White female, no apparent distress She is conversing normally. She uses a walker.  On exam, her vagina is completely prolapsed out. I reduced it and then spent about 45 minutes finding a pessary that would relieve her discomfort/prolapse and didn't cause her discomfort.  The one that worked best for her was a #2 cube. However, we don't have this in stock and she was loathe to go home with the vagina still prolpased.  The second best fit was a 2 1/4 inch Gelhorn. So, we had one of these in stock and she wore it home.      Assessment & Plan:   Symptomatic vaginal vault prolapse- she will use the Gelhorn until the cube arrives. I sent a message to have a #2 and #3 cube ordered. She will come back for the new pessary when it is available.

## 2022-07-21 ENCOUNTER — Ambulatory Visit: Payer: Medicare Other | Admitting: Obstetrics & Gynecology

## 2022-07-26 IMAGING — CR DG CHEST 2V
1 series · 2 of 2 positions shown · non-contrast
Comparison: 03/17/2020

CLINICAL DATA: Tachycardia and weakness beginning today.

EXAM:
CHEST - 2 VIEW

[Series 1: dg chest 2 view · 0.14mm/px · 2 of 2 slices shown]
[im 1/2]
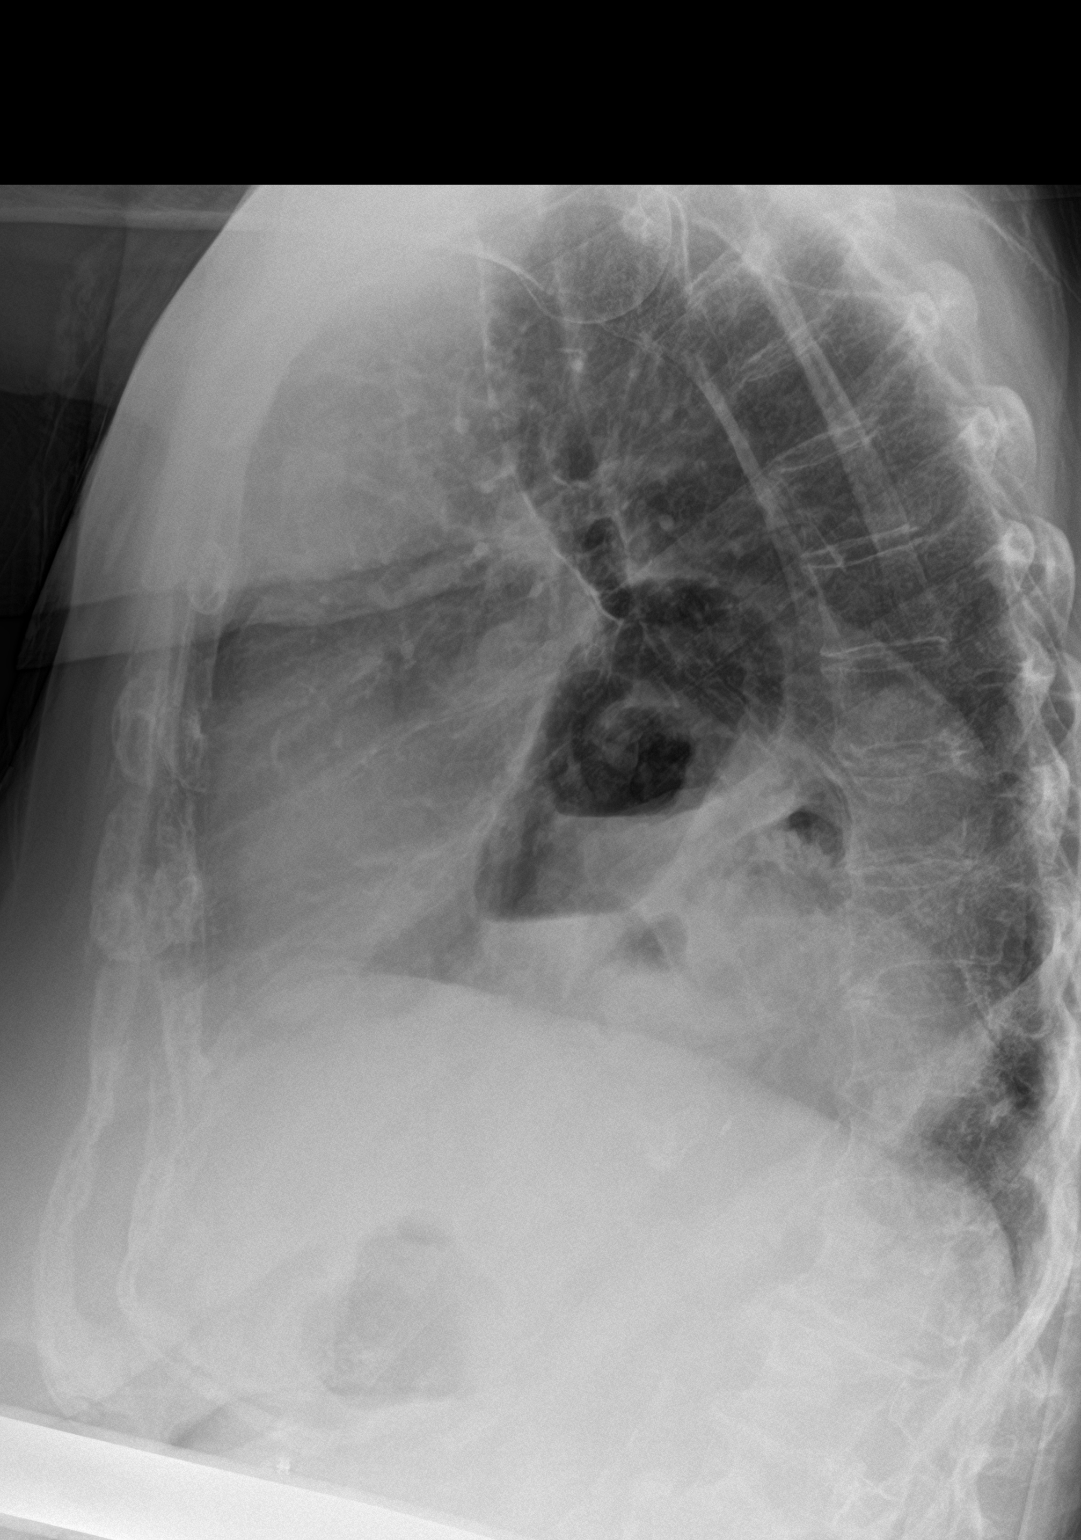
[im 2/2]
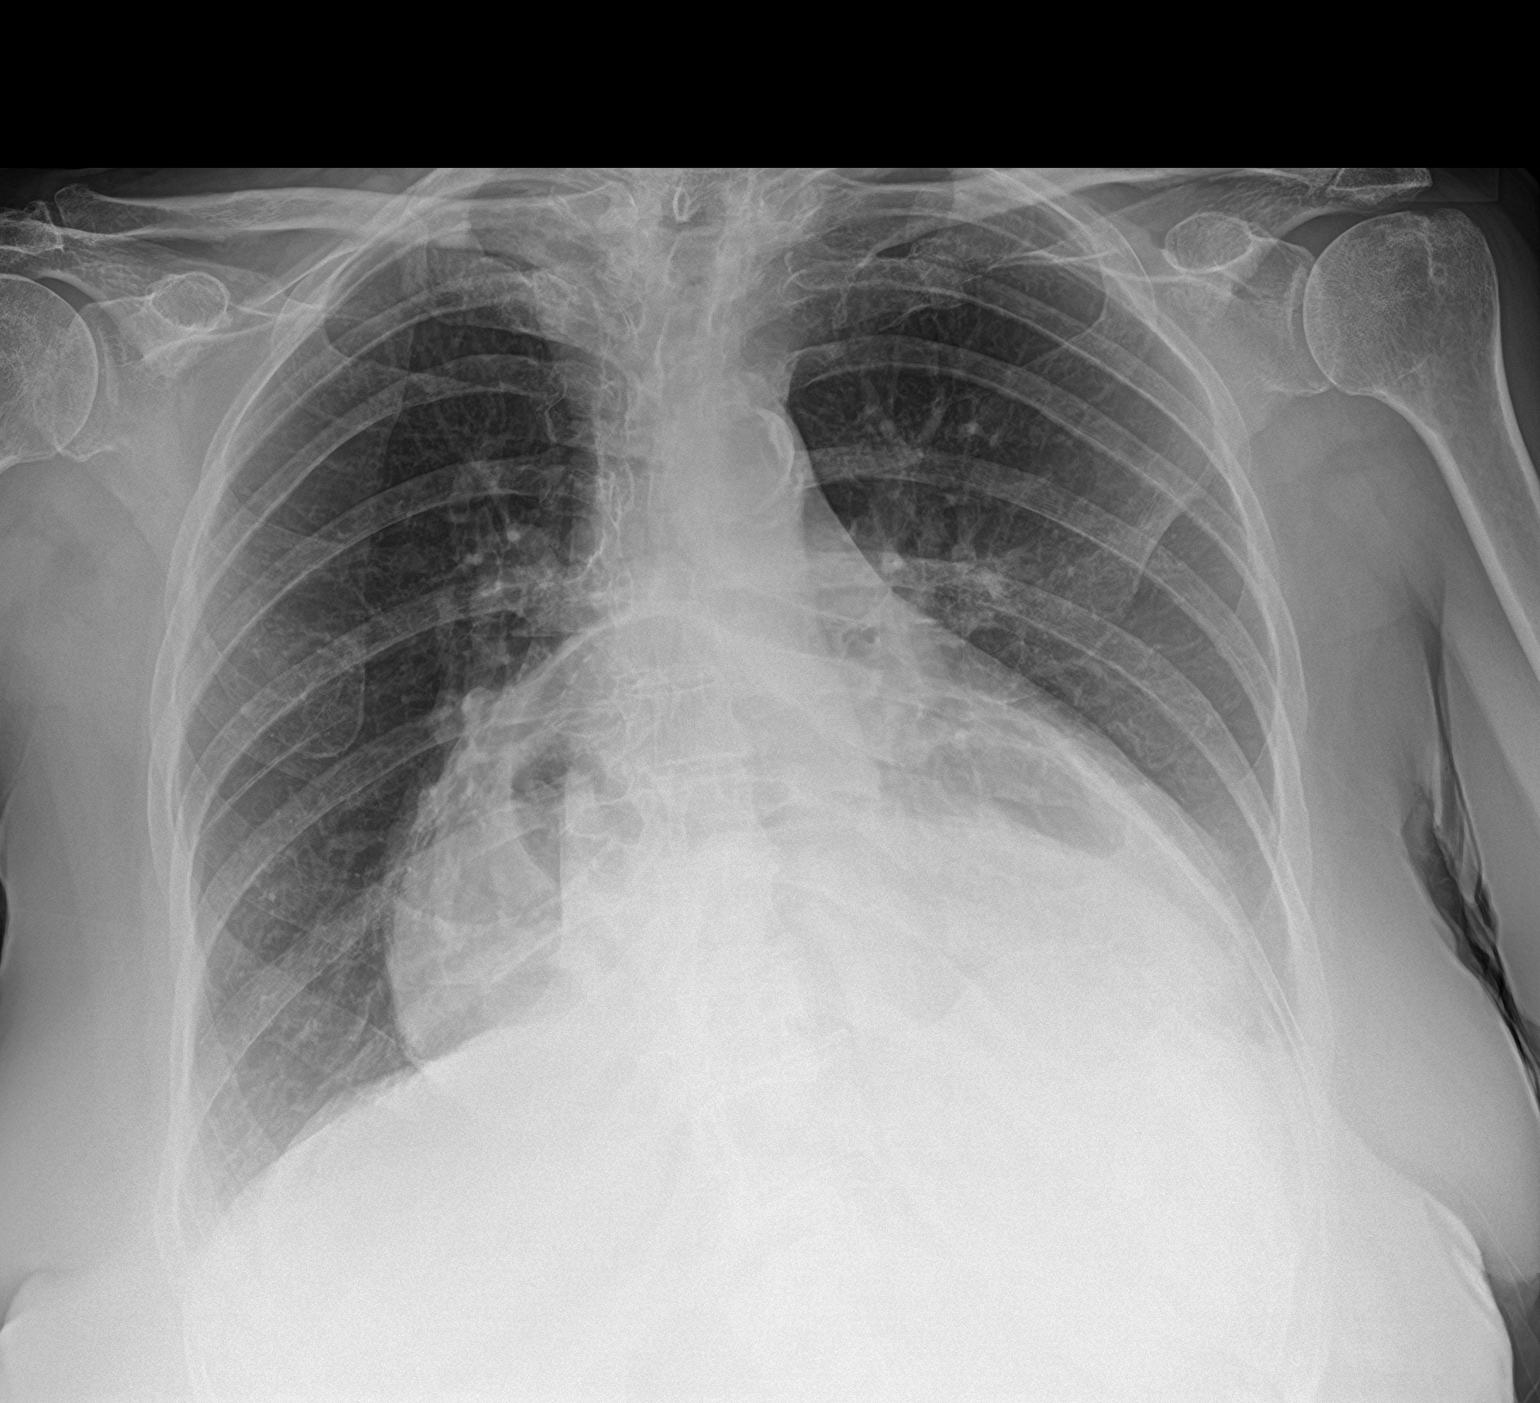

[2 of 2 positions shown; findings below may reference images not displayed]

FINDINGS: Chronically enlarged cardiac silhouette. Chronic very large hiatal
hernia. Aortic atherosclerotic calcification. The lungs are clear
except for atelectasis associated with the large hiatal hernia. No
visible effusion. Upper lungs are clear. No acute bone finding.
IMPRESSION: Cardiomegaly. Aortic atherosclerosis. Very large chronic hiatal
hernia with associated volume loss at the lung bases.

## 2022-07-27 ENCOUNTER — Ambulatory Visit: Payer: Medicare Other | Admitting: Obstetrics & Gynecology

## 2022-07-28 ENCOUNTER — Telehealth: Payer: Self-pay | Admitting: Internal Medicine

## 2022-07-28 NOTE — Telephone Encounter (Signed)
Patient is requesting a refill on her apixaban (ELIQUIS) 5 MG TABS tablet

## 2022-07-29 ENCOUNTER — Other Ambulatory Visit: Payer: Self-pay

## 2022-07-29 MED ORDER — APIXABAN 5 MG PO TABS: 5.0000 mg | ORAL_TABLET | Freq: Two times a day (BID) | ORAL | 1 refills | Status: DC

## 2022-07-29 NOTE — Telephone Encounter (Signed)
Pt notified that refill was sent.

## 2022-08-05 DIAGNOSIS — H15101 Unspecified episcleritis, right eye: Secondary | ICD-10-CM | POA: Diagnosis not present

## 2022-09-05 ENCOUNTER — Other Ambulatory Visit: Payer: Self-pay | Admitting: Internal Medicine

## 2022-09-07 NOTE — Telephone Encounter (Signed)
Rx ok'd for xanax

## 2022-09-07 NOTE — Telephone Encounter (Signed)
Pt need a refill on ALPRAZolam sent to [facility]

## 2022-09-30 ENCOUNTER — Ambulatory Visit (INDEPENDENT_AMBULATORY_CARE_PROVIDER_SITE_OTHER): Payer: Medicare Other | Admitting: Internal Medicine

## 2022-09-30 ENCOUNTER — Encounter: Payer: Self-pay | Admitting: Internal Medicine

## 2022-09-30 VITALS — BP 130/60 | HR 65 | Temp 98.0°F | Ht 64.0 in | Wt 160.8 lb

## 2022-09-30 DIAGNOSIS — I10 Essential (primary) hypertension: Secondary | ICD-10-CM

## 2022-09-30 DIAGNOSIS — R2689 Other abnormalities of gait and mobility: Secondary | ICD-10-CM

## 2022-09-30 DIAGNOSIS — R739 Hyperglycemia, unspecified: Secondary | ICD-10-CM

## 2022-09-30 DIAGNOSIS — G6289 Other specified polyneuropathies: Secondary | ICD-10-CM | POA: Diagnosis not present

## 2022-09-30 DIAGNOSIS — I700 Atherosclerosis of aorta: Secondary | ICD-10-CM | POA: Diagnosis not present

## 2022-09-30 DIAGNOSIS — K449 Diaphragmatic hernia without obstruction or gangrene: Secondary | ICD-10-CM | POA: Diagnosis not present

## 2022-09-30 DIAGNOSIS — M549 Dorsalgia, unspecified: Secondary | ICD-10-CM

## 2022-09-30 DIAGNOSIS — Z8673 Personal history of transient ischemic attack (TIA), and cerebral infarction without residual deficits: Secondary | ICD-10-CM | POA: Diagnosis not present

## 2022-09-30 DIAGNOSIS — N813 Complete uterovaginal prolapse: Secondary | ICD-10-CM

## 2022-09-30 DIAGNOSIS — I4891 Unspecified atrial fibrillation: Secondary | ICD-10-CM

## 2022-09-30 DIAGNOSIS — E7800 Pure hypercholesterolemia, unspecified: Secondary | ICD-10-CM

## 2022-09-30 DIAGNOSIS — G8929 Other chronic pain: Secondary | ICD-10-CM

## 2022-09-30 DIAGNOSIS — F419 Anxiety disorder, unspecified: Secondary | ICD-10-CM

## 2022-09-30 LAB — HEPATIC FUNCTION PANEL
ALT: 13 U/L (ref 0–35)
AST: 18 U/L (ref 0–37)
Albumin: 4.4 g/dL (ref 3.5–5.2)
Alkaline Phosphatase: 78 U/L (ref 39–117)
Bilirubin, Direct: 0.1 mg/dL (ref 0.0–0.3)
Total Bilirubin: 0.5 mg/dL (ref 0.2–1.2)
Total Protein: 7.2 g/dL (ref 6.0–8.3)

## 2022-09-30 LAB — BASIC METABOLIC PANEL
BUN: 13 mg/dL (ref 6–23)
CO2: 32 mEq/L (ref 19–32)
Calcium: 9.5 mg/dL (ref 8.4–10.5)
Chloride: 97 mEq/L (ref 96–112)
Creatinine, Ser: 0.71 mg/dL (ref 0.40–1.20)
GFR: 74.74 mL/min (ref >60.00)
Glucose, Bld: 103 mg/dL — ABNORMAL HIGH (ref 70–99)
Potassium: 4.3 mEq/L (ref 3.5–5.1)
Sodium: 135 mEq/L (ref 135–145)

## 2022-09-30 LAB — CBC WITH DIFFERENTIAL/PLATELET
Basophils Absolute: 0.1 10*3/uL (ref 0.0–0.1)
Basophils Relative: 0.8 % (ref 0.0–3.0)
Eosinophils Absolute: 0.1 10*3/uL (ref 0.0–0.7)
Eosinophils Relative: 1.3 % (ref 0.0–5.0)
HCT: 43.4 % (ref 36.0–46.0)
Hemoglobin: 14.5 g/dL (ref 12.0–15.0)
Lymphocytes Relative: 19.1 % (ref 12.0–46.0)
Lymphs Abs: 1.3 10*3/uL (ref 0.7–4.0)
MCHC: 33.4 g/dL (ref 30.0–36.0)
MCV: 93.2 fl (ref 78.0–100.0)
Monocytes Absolute: 0.8 10*3/uL (ref 0.1–1.0)
Monocytes Relative: 11 % (ref 3.0–12.0)
Neutro Abs: 4.8 10*3/uL (ref 1.4–7.7)
Neutrophils Relative %: 67.8 % (ref 43.0–77.0)
Platelets: 342 10*3/uL (ref 150.0–400.0)
RBC: 4.65 Mil/uL (ref 3.87–5.11)
RDW: 13.8 % (ref 11.5–15.5)
WBC: 7.1 10*3/uL (ref 4.0–10.5)

## 2022-09-30 LAB — LIPID PANEL
Cholesterol: 223 mg/dL — ABNORMAL HIGH (ref 0–200)
HDL: 55.3 mg/dL (ref >39.00)
LDL Cholesterol: 142 mg/dL — ABNORMAL HIGH (ref 0–99)
NonHDL: 167.86
Total CHOL/HDL Ratio: 4
Triglycerides: 130 mg/dL (ref 0.0–149.0)
VLDL: 26 mg/dL (ref 0.0–40.0)

## 2022-09-30 LAB — HEMOGLOBIN A1C: Hgb A1c MFr Bld: 6 % (ref 4.6–6.5)

## 2022-09-30 NOTE — Progress Notes (Signed)
[name], female   DOB: September 24, 1930, 88 y.o.   MRN: [id number]   Subjective:    [name], female    DOB: 1930-11-10, 88 y.o.   MRN: [id number]   Patient here for  Chief Complaint  Patient presents with   Follow-up    3 month follow up.  Accompanied by Venida Jarvis, daughter.    Marland Kitchen   HPI Here to follow up regarding hypercholesterolemia and hypertension.  She is accompanied by her daughter.  History obtained from both of them. Saw GYN for pessary fitting.  Sees cardiology - f/u afib/aflutter.  On diltiazem and eliquis.  Stable.  Has known hiatal hernia.  Eating.  No increased problems swallowing.  No chest pain reported.  Breathing stable.  No increased abdominal pain reported.  Request walker.  Has peripheral neuropathy.  Needs walker - to help with balance - unsteadiness.     Past Medical History:  Diagnosis Date   Anxiety    Collagen vascular disease ([place])    Degenerative disc disease    Depression    GERD (gastroesophageal reflux disease)    Hyperlipidemia    Hypertension    Past Surgical History:  Procedure Laterality Date   ABDOMINAL HYSTERECTOMY  1997   uterus and cervix removed, reports she still has her ovaries   BREAST SURGERY  1960 to 1970   lumps removed   CYSTOCELE REPAIR     ESOPHAGOGASTRODUODENOSCOPY (EGD) WITH PROPOFOL N/A 10/03/2016   Procedure: ESOPHAGOGASTRODUODENOSCOPY (EGD) WITH PROPOFOL;  Surgeon: Manya Silvas, MD;  Location: [facility];  Service: Endoscopy;  Laterality: N/A;   RECTOCELE REPAIR     Family History  Problem Relation Age of Onset   Heart disease Mother    Arthritis Mother    Ulcers Father        uremic poisoning   Arthritis Sister        x 4   Hyperlipidemia Sister        x 1   Hypertension Sister    Cancer Sister        x 2 (ovarian cancer) & 1 (colon cancer)   Diabetes Sister    Dementia Brother    Dementia Brother    Social History   Socioeconomic History   Marital status: Widowed    Spouse  name: Not on file   Number of children: Not on file   Years of education: Not on file   Highest education level: Not on file  Occupational History   Not on file  Tobacco Use   Smoking status: Never   Smokeless tobacco: Never  Vaping Use   Vaping Use: Never used  Substance and Sexual Activity   Alcohol use: No    Alcohol/week: 0.0 standard drinks of alcohol   Drug use: No   Sexual activity: Not Currently    Birth control/protection: None  Other Topics Concern   Not on file  Social History Narrative   Not on file   Social Determinants of Health   Financial Resource Strain: Low Risk  (09/12/2019)   Overall Financial Resource Strain (CARDIA)    Difficulty of Paying Living Expenses: Not hard at all  Food Insecurity: No Food Insecurity (09/12/2019)   Hunger Vital Sign    Worried About Running Out of Food in the Last Year: Never true    Ran Out of Food in the Last Year: Never true  Transportation Needs: No Transportation Needs (09/12/2019)   PRAPARE - Transportation  Lack of Transportation (Medical): No    Lack of Transportation (Non-Medical): No  Physical Activity: Not on file  Stress: No Stress Concern Present (09/12/2019)   [place]    Feeling of Stress : Not at all  Social Connections: Not on file     Review of Systems  Constitutional:  Negative for appetite change and unexpected weight change.  HENT:  Negative for congestion and sinus pressure.   Respiratory:  Negative for cough, chest tightness and shortness of breath.   Cardiovascular:  Negative for chest pain, palpitations and leg swelling.  Gastrointestinal:  Negative for abdominal pain, diarrhea, nausea and vomiting.  Genitourinary:  Negative for difficulty urinating and dysuria.  Musculoskeletal:  Negative for joint swelling and myalgias.  Skin:  Negative for color change and rash.  Neurological:  Negative for dizziness and headaches.   Psychiatric/Behavioral:  Negative for agitation and dysphoric mood.        Objective:     BP 130/60   Pulse 65   Temp 98 F (36.7 C) (Oral)   Ht _0  (1.626 m)   Wt 160 lb 12.8 oz (72.9 kg)   SpO2 97%   BMI 27.60 kg/m  Wt Readings from Last 3 Encounters:  09/30/22 160 lb 12.8 oz (72.9 kg)  07/14/22 155 lb (70.3 kg)  07/06/22 160 lb 4.8 oz (72.7 kg)    Physical Exam Vitals reviewed.  Constitutional:      General: She is not in acute distress.    Appearance: Normal appearance.  HENT:     Head: Normocephalic and atraumatic.     Right Ear: External ear normal.     Left Ear: External ear normal.  Eyes:     General: No scleral icterus.       Right eye: No discharge.        Left eye: No discharge.     Conjunctiva/sclera: Conjunctivae normal.  Neck:     Thyroid: No thyromegaly.  Cardiovascular:     Rate and Rhythm: Normal rate and regular rhythm.  Pulmonary:     Effort: No respiratory distress.     Breath sounds: Normal breath sounds. No wheezing.  Abdominal:     General: Bowel sounds are normal.     Palpations: Abdomen is soft.     Tenderness: There is no abdominal tenderness.  Musculoskeletal:        General: No swelling or tenderness.     Cervical back: Neck supple. No tenderness.  Lymphadenopathy:     Cervical: No cervical adenopathy.  Skin:    Findings: No erythema or rash.  Neurological:     Mental Status: She is alert.  Psychiatric:        Mood and Affect: Mood normal.        Behavior: Behavior normal.      Outpatient Encounter Medications as of 09/30/2022  Medication Sig   ALPRAZolam (XANAX) 0.5 MG tablet TAKE 1/2 TABLET BY MOUTH ONCE DAILY AND 1 AT BEDTIME   apixaban (ELIQUIS) 5 MG TABS tablet Take 1 tablet (5 mg total) by mouth 2 (two) times daily.   diltiazem (CARDIZEM CD) 120 MG 24 hr capsule Take 1 capsule (120 mg total) by mouth daily. (Patient taking differently: Take 120 mg by mouth daily. 2 tablets daily)   famotidine (PEPCID) 20 MG  tablet TAKE 1 TABLET BY MOUTH  DAILY   losartan (COZAAR) 25 MG tablet Take 25 mg by mouth daily.   Multiple  Vitamin (MULTIVITAMIN) tablet Take 2 tablets by mouth daily. Uses 2 gummies daily   No facility-administered encounter medications on file as of 09/30/2022.     Lab Results  Component Value Date   WBC 7.1 09/30/2022   HGB 14.5 09/30/2022   HCT 43.4 09/30/2022   PLT 342.0 09/30/2022   GLUCOSE 103 (H) 09/30/2022   CHOL 223 (H) 09/30/2022   TRIG 130.0 09/30/2022   HDL 55.30 09/30/2022   LDLCALC 142 (H) 09/30/2022   ALT 13 09/30/2022   AST 18 09/30/2022   NA 135 09/30/2022   K 4.3 09/30/2022   CL 97 09/30/2022   CREATININE 0.71 09/30/2022   BUN 13 09/30/2022   CO2 32 09/30/2022   TSH 1.43 12/03/2021   HGBA1C 6.0 09/30/2022    DG Chest 2 View  Result Date: 08/19/2021 CLINICAL DATA:  Tachycardia and weakness beginning today. EXAM: CHEST - 2 VIEW COMPARISON:  03/17/2020 FINDINGS: Chronically enlarged cardiac silhouette. Chronic very large hiatal hernia. Aortic atherosclerotic calcification. The lungs are clear except for atelectasis associated with the large hiatal hernia. No visible effusion. Upper lungs are clear. No acute bone finding. IMPRESSION: Cardiomegaly. Aortic atherosclerosis. Very large chronic hiatal hernia with associated volume loss at the lung bases. Electronically Signed   By: Nelson Chimes M.D.   On: 08/19/2021 12:26       Assessment & Plan:   Problem List Items Addressed This Visit     Anxiety    Takes xanax.  Have discussed risk/side effects of long term anxiolytics.  Have discussed continuing to try to decrease dose - least effective dose.  Follow.       Aortic atherosclerosis ([place])    Intolerant to statin medication.  Low cholesterol diet.       Atrial fibrillation ([place])    On diltiazem and eliquis.  Denies increased heart rate or palpitations.  Follow.       Relevant Orders   For home use only DME 4 wheeled rolling walker with seat ([id number])    Chronic back pain    Uses a walker to help ambulate.        Cystocele and rectocele with complete uterovaginal prolapse    Saw GYN for pessary fitting.       Diaphragmatic hernia    S/p surgery.  She is eating. Has seen GI.  Stable.   Follow.        Essential hypertension, benign - Primary    Continue cardizem and losartan.  Follow pressures.  Follow metabolic panel.         Relevant Orders   For home use only DME 4 wheeled rolling walker with seat ([id number])   History of CVA (cerebrovascular accident)    Continue eliquis and aspirin.        Hypercholesterolemia    Intolerant to statin medication. Low cholesterol diet and exercise.  Follow lipid panel.       Hyperglycemia    Low carb diet and exercise.  Follow met b and a1c.       Peripheral neuropathy    Uses a walker.  Stable. No pain.  Follow.        Relevant Orders   For home use only DME 4 wheeled rolling walker with seat ([id number])   Unstable balance    With peripheral neuropathy and back pain, will need to continue to use her walker to help ambulate.  Rx given for new walker.         Einar Pheasant, MD

## 2022-10-06 ENCOUNTER — Telehealth: Payer: Self-pay | Admitting: Internal Medicine

## 2022-10-06 NOTE — Telephone Encounter (Signed)
S/w Marcene Brawn - given Peripheral neuropathy [G62.9]

## 2022-10-06 NOTE — Telephone Encounter (Signed)
Kara from [facility] called stating they need a diagnostic code/mobility code related to pt gait for her walker

## 2022-10-07 ENCOUNTER — Telehealth: Payer: Self-pay

## 2022-10-07 NOTE — Telephone Encounter (Signed)
Pt called stating that she needs a new walker as her wheels are falling off.   Pt sates she needs Dr. Nicki Reaper to call [place] @ [phone] and give them a code that states she needs a walker.

## 2022-10-07 NOTE — Telephone Encounter (Signed)
S/w Marcene Brawn again over at [facility] - stated she spoke to her manager regarding the information I provided to her yesterday, and they need to have a mobility R code associated with the walker order or the patient will be denied.  Marcene Brawn suggested placing an addendum on the patients most recent visit and using code R26.89.

## 2022-10-07 NOTE — Telephone Encounter (Signed)
What diagnosis is R26.89.  I could not get it to pull up.  And if this is a code we can use, what exactly do we need to do.

## 2022-10-08 ENCOUNTER — Encounter: Payer: Self-pay | Admitting: Internal Medicine

## 2022-10-08 DIAGNOSIS — R2689 Other abnormalities of gait and mobility: Secondary | ICD-10-CM | POA: Insufficient documentation

## 2022-10-08 NOTE — Assessment & Plan Note (Signed)
With peripheral neuropathy and back pain, will need to continue to use her walker to help ambulate.  Rx given for new walker.

## 2022-10-08 NOTE — Assessment & Plan Note (Addendum)
Takes xanax.  Have discussed risk/side effects of long term anxiolytics.  Have discussed continuing to try to decrease dose - least effective dose.  Follow.

## 2022-10-08 NOTE — Assessment & Plan Note (Signed)
Uses a walker to help ambulate.

## 2022-10-08 NOTE — Assessment & Plan Note (Signed)
Low carb diet and exercise.  Follow met b and a1c.  

## 2022-10-08 NOTE — Assessment & Plan Note (Signed)
Uses a walker.  Stable. No pain.  Follow.

## 2022-10-08 NOTE — Assessment & Plan Note (Signed)
Intolerant to statin medication.  Low cholesterol diet.

## 2022-10-08 NOTE — Assessment & Plan Note (Signed)
Continue cardizem and losartan.  Follow pressures.  Follow metabolic panel.

## 2022-10-08 NOTE — Assessment & Plan Note (Signed)
Intolerant to statin medication. Low cholesterol diet and exercise.  Follow lipid panel.

## 2022-10-08 NOTE — Telephone Encounter (Signed)
Kara at [facility] given updated mobility code of R26.89

## 2022-10-08 NOTE — Assessment & Plan Note (Signed)
S/p surgery.  She is eating. Has seen GI.  Stable.   Follow.

## 2022-10-08 NOTE — Assessment & Plan Note (Signed)
Continue eliquis and aspirin.

## 2022-10-08 NOTE — Telephone Encounter (Signed)
Note complete 

## 2022-10-08 NOTE — Assessment & Plan Note (Signed)
On diltiazem and eliquis.  Denies increased heart rate or palpitations.  Follow.

## 2022-10-08 NOTE — Assessment & Plan Note (Signed)
Saw GYN for pessary fitting.

## 2022-10-09 ENCOUNTER — Other Ambulatory Visit: Payer: Self-pay | Admitting: Internal Medicine

## 2022-10-11 NOTE — Telephone Encounter (Signed)
Reviewed PDMP - she just had alprazolam refilled 10/09/22.  Should not need another rx now.  Please notify

## 2022-10-12 DIAGNOSIS — I4891 Unspecified atrial fibrillation: Secondary | ICD-10-CM | POA: Diagnosis not present

## 2022-10-12 DIAGNOSIS — R2689 Other abnormalities of gait and mobility: Secondary | ICD-10-CM | POA: Diagnosis not present

## 2022-10-12 NOTE — Telephone Encounter (Signed)
Patient's daughter, Caffie Pinto, called to state she spoke with Marcene Brawn at [facility] this morning and Marcene Brawn states she has not heard back from our office.  I read the information from Dr. Nicki Reaper and Denita Lung, [place].  I let patient know that we will follow-up with Marcene Brawn to see what else she may need from [place] in order for patient to get her walker.  Judeen Hammans states she is willing to help if needed.

## 2022-10-12 NOTE — Telephone Encounter (Signed)
S/w Judeen Hammans - advised of Mobility assessment  Advised will fax now

## 2022-10-22 ENCOUNTER — Emergency Department: Payer: Medicare Other

## 2022-10-22 ENCOUNTER — Other Ambulatory Visit: Payer: Self-pay

## 2022-10-22 ENCOUNTER — Inpatient Hospital Stay
Admission: EM | Admit: 2022-10-22 | Discharge: 2022-10-23 | DRG: 392 | Disposition: A | Discharge status: Hospice | Payer: Medicare Other | Attending: Internal Medicine | Admitting: Internal Medicine

## 2022-10-22 DIAGNOSIS — K449 Diaphragmatic hernia without obstruction or gangrene: Secondary | ICD-10-CM | POA: Diagnosis not present

## 2022-10-22 DIAGNOSIS — K311 Adult hypertrophic pyloric stenosis: Secondary | ICD-10-CM | POA: Diagnosis not present

## 2022-10-22 DIAGNOSIS — Z743 Need for continuous supervision: Secondary | ICD-10-CM | POA: Diagnosis not present

## 2022-10-22 DIAGNOSIS — F32A Depression, unspecified: Secondary | ICD-10-CM | POA: Diagnosis not present

## 2022-10-22 DIAGNOSIS — Z833 Family history of diabetes mellitus: Secondary | ICD-10-CM | POA: Diagnosis not present

## 2022-10-22 DIAGNOSIS — Z888 Allergy status to other drugs, medicaments and biological substances status: Secondary | ICD-10-CM | POA: Diagnosis not present

## 2022-10-22 DIAGNOSIS — I4891 Unspecified atrial fibrillation: Secondary | ICD-10-CM | POA: Diagnosis present

## 2022-10-22 DIAGNOSIS — Z515 Encounter for palliative care: Secondary | ICD-10-CM

## 2022-10-22 DIAGNOSIS — Z8673 Personal history of transient ischemic attack (TIA), and cerebral infarction without residual deficits: Secondary | ICD-10-CM

## 2022-10-22 DIAGNOSIS — Z7901 Long term (current) use of anticoagulants: Secondary | ICD-10-CM | POA: Diagnosis not present

## 2022-10-22 DIAGNOSIS — K219 Gastro-esophageal reflux disease without esophagitis: Secondary | ICD-10-CM | POA: Diagnosis not present

## 2022-10-22 DIAGNOSIS — K5730 Diverticulosis of large intestine without perforation or abscess without bleeding: Secondary | ICD-10-CM | POA: Diagnosis not present

## 2022-10-22 DIAGNOSIS — Z79899 Other long term (current) drug therapy: Secondary | ICD-10-CM

## 2022-10-22 DIAGNOSIS — N2889 Other specified disorders of kidney and ureter: Secondary | ICD-10-CM | POA: Diagnosis not present

## 2022-10-22 DIAGNOSIS — K3189 Other diseases of stomach and duodenum: Secondary | ICD-10-CM | POA: Diagnosis not present

## 2022-10-22 DIAGNOSIS — Z8261 Family history of arthritis: Secondary | ICD-10-CM

## 2022-10-22 DIAGNOSIS — E785 Hyperlipidemia, unspecified: Secondary | ICD-10-CM | POA: Diagnosis present

## 2022-10-22 DIAGNOSIS — R1111 Vomiting without nausea: Secondary | ICD-10-CM | POA: Diagnosis not present

## 2022-10-22 DIAGNOSIS — Z880 Allergy status to penicillin: Secondary | ICD-10-CM | POA: Diagnosis not present

## 2022-10-22 DIAGNOSIS — F419 Anxiety disorder, unspecified: Secondary | ICD-10-CM | POA: Diagnosis not present

## 2022-10-22 DIAGNOSIS — I10 Essential (primary) hypertension: Secondary | ICD-10-CM | POA: Diagnosis not present

## 2022-10-22 DIAGNOSIS — R682 Dry mouth, unspecified: Secondary | ICD-10-CM | POA: Diagnosis not present

## 2022-10-22 DIAGNOSIS — Z66 Do not resuscitate: Secondary | ICD-10-CM | POA: Diagnosis present

## 2022-10-22 DIAGNOSIS — Z8249 Family history of ischemic heart disease and other diseases of the circulatory system: Secondary | ICD-10-CM

## 2022-10-22 DIAGNOSIS — K562 Volvulus: Secondary | ICD-10-CM | POA: Diagnosis not present

## 2022-10-22 DIAGNOSIS — Z83438 Family history of other disorder of lipoprotein metabolism and other lipidemia: Secondary | ICD-10-CM

## 2022-10-22 DIAGNOSIS — K4090 Unilateral inguinal hernia, without obstruction or gangrene, not specified as recurrent: Secondary | ICD-10-CM | POA: Diagnosis present

## 2022-10-22 DIAGNOSIS — Z882 Allergy status to sulfonamides status: Secondary | ICD-10-CM | POA: Diagnosis not present

## 2022-10-22 DIAGNOSIS — R110 Nausea: Secondary | ICD-10-CM | POA: Diagnosis not present

## 2022-10-22 DIAGNOSIS — R109 Unspecified abdominal pain: Secondary | ICD-10-CM | POA: Diagnosis not present

## 2022-10-22 HISTORY — DX: Diaphragmatic hernia without obstruction or gangrene: K44.9

## 2022-10-22 LAB — COMPREHENSIVE METABOLIC PANEL
ALT: 16 U/L (ref 0–44)
AST: 26 U/L (ref 15–41)
Albumin: 4.3 g/dL (ref 3.5–5.0)
Alkaline Phosphatase: 75 U/L (ref 38–126)
Anion gap: 16 — ABNORMAL HIGH (ref 5–15)
BUN: 17 mg/dL (ref 8–23)
CO2: 26 mmol/L (ref 22–32)
Calcium: 9.1 mg/dL (ref 8.9–10.3)
Chloride: 99 mmol/L (ref 98–111)
Creatinine, Ser: 0.79 mg/dL (ref 0.44–1.00)
GFR, Estimated: >60 mL/min (ref >60)
Glucose, Bld: 176 mg/dL — ABNORMAL HIGH (ref 70–99)
Potassium: 3.9 mmol/L (ref 3.5–5.1)
Sodium: 141 mmol/L (ref 135–145)
Total Bilirubin: 0.8 mg/dL (ref 0.3–1.2)
Total Protein: 7.7 g/dL (ref 6.5–8.1)

## 2022-10-22 LAB — CBC WITH DIFFERENTIAL/PLATELET
Abs Immature Granulocytes: 0.04 10*3/uL (ref 0.00–0.07)
Basophils Absolute: 0 10*3/uL (ref 0.0–0.1)
Basophils Relative: 0 %
Eosinophils Absolute: 0 10*3/uL (ref 0.0–0.5)
Eosinophils Relative: 0 %
HCT: 42.4 % (ref 36.0–46.0)
Hemoglobin: 14.2 g/dL (ref 12.0–15.0)
Immature Granulocytes: 0 %
Lymphocytes Relative: 11 %
Lymphs Abs: 1.2 10*3/uL (ref 0.7–4.0)
MCH: 30.6 pg (ref 26.0–34.0)
MCHC: 33.5 g/dL (ref 30.0–36.0)
MCV: 91.4 fL (ref 80.0–100.0)
Monocytes Absolute: 0.5 10*3/uL (ref 0.1–1.0)
Monocytes Relative: 5 %
Neutro Abs: 9.5 10*3/uL — ABNORMAL HIGH (ref 1.7–7.7)
Neutrophils Relative %: 84 %
Platelets: 303 10*3/uL (ref 150–400)
RBC: 4.64 MIL/uL (ref 3.87–5.11)
RDW: 13.2 % (ref 11.5–15.5)
WBC: 11.3 10*3/uL — ABNORMAL HIGH (ref 4.0–10.5)
nRBC: 0 % (ref 0.0–0.2)

## 2022-10-22 LAB — URINALYSIS, ROUTINE W REFLEX MICROSCOPIC
Bacteria, UA: NONE SEEN
Bilirubin Urine: NEGATIVE
Glucose, UA: 50 mg/dL — AB
Hgb urine dipstick: NEGATIVE
Ketones, ur: 5 mg/dL — AB
Nitrite: NEGATIVE
Protein, ur: NEGATIVE mg/dL
Specific Gravity, Urine: 1.018 (ref 1.005–1.030)
pH: 7 (ref 5.0–8.0)

## 2022-10-22 LAB — LIPASE, BLOOD: Lipase: 34 U/L (ref 11–51)

## 2022-10-22 MED ORDER — LACTATED RINGERS IV BOLUS
1000.0000 mL | Freq: Once | INTRAVENOUS | Status: AC
  Administered 2022-10-22: 1000 mL via INTRAVENOUS

## 2022-10-22 MED ORDER — MORPHINE SULFATE (PF) 4 MG/ML IV SOLN
4.0000 mg | Freq: Once | INTRAVENOUS | Status: AC
  Administered 2022-10-22: 4 mg via INTRAVENOUS
  Filled 2022-10-22: qty 1

## 2022-10-22 MED ORDER — IOHEXOL 300 MG/ML  SOLN
100.0000 mL | Freq: Once | INTRAMUSCULAR | Status: AC | PRN
  Administered 2022-10-22: 100 mL via INTRAVENOUS

## 2022-10-22 MED ORDER — HYDROMORPHONE HCL 1 MG/ML IJ SOLN
0.5000 mg | Freq: Once | INTRAMUSCULAR | Status: AC
  Administered 2022-10-23: 0.5 mg via INTRAVENOUS
  Filled 2022-10-22: qty 0.5

## 2022-10-22 MED ORDER — ONDANSETRON HCL 4 MG/2ML IJ SOLN
4.0000 mg | Freq: Once | INTRAMUSCULAR | Status: AC
  Administered 2022-10-22: 4 mg via INTRAVENOUS
  Filled 2022-10-22: qty 2

## 2022-10-22 NOTE — ED Notes (Signed)
ED Provider at bedside for re-eval

## 2022-10-22 NOTE — ED Notes (Signed)
Pt returned to room. Monitor applied and family at the bedside.

## 2022-10-22 NOTE — ED Triage Notes (Signed)
Abd pain radiating to back. Intermittent n/v since 1730. Denies chest pain. Hx of hernia.

## 2022-10-22 NOTE — ED Provider Notes (Signed)
[facility] Provider Note    Event Date/Time   First MD Initiated Contact with Patient 10/22/22 2139     (approximate)   History   Chief Complaint Abdominal Pain   HPI  Sandra Reyes is a 88 y.o. female with past medical history of hypertension, hyperlipidemia, atrial fibrillation on Eliquis, stroke, GERD, and hiatal hernia who presents to the ED complaining of abdominal pain.  Patient reports that she had acute onset of diffuse pain across her abdomen around 530 this evening.  Patient reports that onset occurred shortly after eating and she has been feeling sick to her stomach, but has not vomited.  She denies any changes in her bowel movements, has not noticed any blood in her stool recently.  She denies any fevers, dysuria, hematuria, or flank pain.  She denies similar symptoms in the past, but has been told her hiatal hernia may cause problems.  She denies any cough, chest pain, or shortness of breath.     Physical Exam   Triage Vital Signs: ED Triage Vitals [10/22/22 2139]  Enc Vitals Group     BP      Pulse      Resp      Temp      Temp src      SpO2 98 %     Weight      Height      Head Circumference      Peak Flow      Pain Score      Pain Loc      Pain Edu?      Excl. in [place]?     Most recent vital signs: Vitals:   10/22/22 2230 10/22/22 2315  BP: (!) 160/76 (!) 154/90  Pulse: 96 (!) 102  Resp: 15 16  Temp:    SpO2: 92% 96%    Constitutional: Alert and oriented. Eyes: Conjunctivae are normal. Head: Atraumatic. Nose: No congestion/rhinnorhea. Mouth/Throat: Mucous membranes are moist.  Cardiovascular: Normal rate, regular rhythm. Grossly normal heart sounds.  2+ radial pulses bilaterally. Respiratory: Normal respiratory effort.  No retractions. Lungs CTAB. Gastrointestinal: Soft and diffusely tender to palpation with no rebound or guarding. No distention. Musculoskeletal: No lower extremity tenderness nor edema.  Neurologic:   Normal speech and language. No gross focal neurologic deficits are appreciated.    ED Results / Procedures / Treatments   Labs (all labs ordered are listed, but only abnormal results are displayed) Labs Reviewed  CBC WITH DIFFERENTIAL/PLATELET - Abnormal; Notable for the following components:      Result Value   WBC 11.3 (*)    Neutro Abs 9.5 (*)    All other components within normal limits  COMPREHENSIVE METABOLIC PANEL - Abnormal; Notable for the following components:   Glucose, Bld 176 (*)    Anion gap 16 (*)    All other components within normal limits  URINALYSIS, ROUTINE W REFLEX MICROSCOPIC - Abnormal; Notable for the following components:   Color, Urine STRAW (*)    APPearance CLEAR (*)    Glucose, UA 50 (*)    Ketones, ur 5 (*)    Leukocytes,Ua TRACE (*)    All other components within normal limits  LIPASE, BLOOD  LACTIC ACID, PLASMA  LACTIC ACID, PLASMA     EKG  ED ECG REPORT I, Blake Divine, the attending physician, personally viewed and interpreted this ECG.   Date: 10/22/2022  EKG Time: 21:45  Rate: 94  Rhythm: normal sinus rhythm  Axis: Normal  Intervals:none  ST&T Change: None  RADIOLOGY CT abdomen/pelvis reviewed and interpreted by me with large hiatal hernia and evidence of obstruction.  PROCEDURES:  Critical Care performed: Yes, see critical care procedure note(s)  .Critical Care  Performed by: Blake Divine, MD Authorized by: Blake Divine, MD   Critical care provider statement:    Critical care time (minutes):  30   Critical care time was exclusive of:  Separately billable procedures and treating other patients and teaching time   Critical care was time spent personally by me on the following activities:  Development of treatment plan with patient or surrogate, discussions with consultants, evaluation of patient's response to treatment, examination of patient, ordering and review of laboratory studies, ordering and review of  radiographic studies, ordering and performing treatments and interventions, pulse oximetry, re-evaluation of patient's condition and review of old charts    MEDICATIONS ORDERED IN ED: Medications  lactated ringers bolus 1,000 mL (0 mLs Intravenous Stopped 10/22/22 2215)  ondansetron (ZOFRAN) injection 4 mg (4 mg Intravenous Given 10/22/22 2159)  morphine (PF) 4 MG/ML injection 4 mg (4 mg Intravenous Given 10/22/22 2158)  iohexol (OMNIPAQUE) 300 MG/ML solution 100 mL (100 mLs Intravenous Contrast Given 10/22/22 2300)  HYDROmorphone (DILAUDID) injection 0.5 mg (0.5 mg Intravenous Given 10/23/22 0003)     IMPRESSION / MDM / [name] / ED COURSE  I reviewed the triage vital signs and the nursing notes.                              88 y.o. female with past medical history of hypertension, hyperlipidemia, stroke, atrial fibrillation on Eliquis, GERD, and hiatal hernia who presents to the ED complaining of acute onset abdominal pain with nausea around 530 this evening after eating dinner.  Patient's presentation is most consistent with acute presentation with potential threat to life or bodily function.  Differential diagnosis includes, but is not limited to, bowel obstruction, biliary colic, cholecystitis, pancreatitis, hepatitis, appendicitis, kidney stone, UTI, gastritis, GERD.  Patient nontoxic-appearing and in no acute distress, vital signs are unremarkable.  She does appear uncomfortable and has diffuse tenderness on abdominal exam, will further assess with CT imaging.  Labs and urinalysis are pending, we will treat symptomatically with IV morphine and Zofran, hydrate with IV fluids.  Labs are remarkable for mild leukocytosis, no significant anemia, electrolyte abnormality, or AKI noted.  LFTs and lipase are unremarkable, urinalysis shows no signs of infection.  CT imaging is concerning for large hiatal hernia with apparent gastric volvulus and evidence of gastric outlet  obstruction.  Findings discussed with Dr. Lysle Pearl of general surgery, who recommends transfer to Center with cardiothoracic surgery available for potential surgical intervention.  Patient is unsure whether she is interested in surgical intervention, will further discuss with family.  We will reach out to the transfer center at [facility] as this is where patient has been previously seen for this problem, has required reduction of gastric volvulus with gastropexy and G-tube placement previously in 2017.      FINAL CLINICAL IMPRESSION(S) / ED DIAGNOSES   Final diagnoses:  Gastric volvulus  Gastric outlet obstruction     Rx / DC Orders   ED Discharge Orders     None        Note:  This document was prepared using Dragon voice recognition software and may include unintentional dictation errors.   Blake Divine, MD 10/23/22 0010

## 2022-10-22 NOTE — ED Notes (Signed)
Pt to CT

## 2022-10-22 NOTE — ED Notes (Signed)
ED Provider at bedside. 

## 2022-10-23 ENCOUNTER — Encounter: Payer: Self-pay | Admitting: Family Medicine

## 2022-10-23 DIAGNOSIS — E785 Hyperlipidemia, unspecified: Secondary | ICD-10-CM | POA: Diagnosis present

## 2022-10-23 DIAGNOSIS — Z880 Allergy status to penicillin: Secondary | ICD-10-CM | POA: Diagnosis not present

## 2022-10-23 DIAGNOSIS — K311 Adult hypertrophic pyloric stenosis: Secondary | ICD-10-CM

## 2022-10-23 DIAGNOSIS — K5730 Diverticulosis of large intestine without perforation or abscess without bleeding: Secondary | ICD-10-CM | POA: Diagnosis present

## 2022-10-23 DIAGNOSIS — Z8261 Family history of arthritis: Secondary | ICD-10-CM | POA: Diagnosis not present

## 2022-10-23 DIAGNOSIS — Z66 Do not resuscitate: Secondary | ICD-10-CM

## 2022-10-23 DIAGNOSIS — Z8249 Family history of ischemic heart disease and other diseases of the circulatory system: Secondary | ICD-10-CM | POA: Diagnosis not present

## 2022-10-23 DIAGNOSIS — Z515 Encounter for palliative care: Secondary | ICD-10-CM | POA: Diagnosis not present

## 2022-10-23 DIAGNOSIS — Z888 Allergy status to other drugs, medicaments and biological substances status: Secondary | ICD-10-CM | POA: Diagnosis not present

## 2022-10-23 DIAGNOSIS — K219 Gastro-esophageal reflux disease without esophagitis: Secondary | ICD-10-CM | POA: Diagnosis present

## 2022-10-23 DIAGNOSIS — R682 Dry mouth, unspecified: Secondary | ICD-10-CM | POA: Diagnosis present

## 2022-10-23 DIAGNOSIS — I10 Essential (primary) hypertension: Secondary | ICD-10-CM

## 2022-10-23 DIAGNOSIS — Z7901 Long term (current) use of anticoagulants: Secondary | ICD-10-CM | POA: Diagnosis not present

## 2022-10-23 DIAGNOSIS — K449 Diaphragmatic hernia without obstruction or gangrene: Secondary | ICD-10-CM | POA: Diagnosis present

## 2022-10-23 DIAGNOSIS — I4891 Unspecified atrial fibrillation: Secondary | ICD-10-CM | POA: Diagnosis present

## 2022-10-23 DIAGNOSIS — F419 Anxiety disorder, unspecified: Secondary | ICD-10-CM

## 2022-10-23 DIAGNOSIS — R110 Nausea: Secondary | ICD-10-CM | POA: Diagnosis not present

## 2022-10-23 DIAGNOSIS — K3189 Other diseases of stomach and duodenum: Principal | ICD-10-CM

## 2022-10-23 DIAGNOSIS — Z8673 Personal history of transient ischemic attack (TIA), and cerebral infarction without residual deficits: Secondary | ICD-10-CM | POA: Diagnosis not present

## 2022-10-23 DIAGNOSIS — Z882 Allergy status to sulfonamides status: Secondary | ICD-10-CM | POA: Diagnosis not present

## 2022-10-23 DIAGNOSIS — Z83438 Family history of other disorder of lipoprotein metabolism and other lipidemia: Secondary | ICD-10-CM | POA: Diagnosis not present

## 2022-10-23 DIAGNOSIS — Z833 Family history of diabetes mellitus: Secondary | ICD-10-CM | POA: Diagnosis not present

## 2022-10-23 DIAGNOSIS — Z79899 Other long term (current) drug therapy: Secondary | ICD-10-CM | POA: Diagnosis not present

## 2022-10-23 DIAGNOSIS — K4090 Unilateral inguinal hernia, without obstruction or gangrene, not specified as recurrent: Secondary | ICD-10-CM | POA: Diagnosis present

## 2022-10-23 DIAGNOSIS — F32A Depression, unspecified: Secondary | ICD-10-CM | POA: Diagnosis present

## 2022-10-23 LAB — LACTIC ACID, PLASMA: Lactic Acid, Venous: 1.8 mmol/L (ref 0.5–1.9)

## 2022-10-23 MED ORDER — ACETAMINOPHEN 325 MG PO TABS: 650.0000 mg | ORAL_TABLET | Freq: Four times a day (QID) | ORAL | Status: DC | PRN

## 2022-10-23 MED ORDER — ACETAMINOPHEN 650 MG RE SUPP: 650.0000 mg | Freq: Four times a day (QID) | RECTAL | Status: DC | PRN

## 2022-10-23 MED ORDER — DROPERIDOL 2.5 MG/ML IJ SOLN
1.2500 mg | Freq: Once | INTRAMUSCULAR | Status: AC
  Administered 2022-10-23: 1.25 mg via INTRAVENOUS
  Filled 2022-10-23: qty 2

## 2022-10-23 MED ORDER — HALOPERIDOL LACTATE 2 MG/ML PO CONC: 0.5000 mg | ORAL | Status: DC | PRN

## 2022-10-23 MED ORDER — GLYCOPYRROLATE 1 MG PO TABS: 1.0000 mg | ORAL_TABLET | ORAL | Status: DC | PRN

## 2022-10-23 MED ORDER — DIPHENHYDRAMINE HCL 50 MG/ML IJ SOLN: 12.5000 mg | INTRAMUSCULAR | Status: DC | PRN

## 2022-10-23 MED ORDER — ONDANSETRON HCL 4 MG/2ML IJ SOLN
4.0000 mg | Freq: Four times a day (QID) | INTRAMUSCULAR | Status: DC
  Administered 2022-10-23 (×2): 4 mg via INTRAVENOUS
  Filled 2022-10-23 (×2): qty 2

## 2022-10-23 MED ORDER — LORAZEPAM 1 MG PO TABS: 1.0000 mg | ORAL_TABLET | ORAL | Status: DC | PRN

## 2022-10-23 MED ORDER — HYDROMORPHONE HCL 1 MG/ML IJ SOLN: 0.5000 mg | INTRAMUSCULAR | Status: DC | PRN

## 2022-10-23 MED ORDER — ONDANSETRON 4 MG PO TBDP: 4.0000 mg | ORAL_TABLET | Freq: Four times a day (QID) | ORAL | Status: DC | PRN

## 2022-10-23 MED ORDER — GLYCOPYRROLATE 0.2 MG/ML IJ SOLN: 0.2000 mg | INTRAMUSCULAR | Status: DC | PRN

## 2022-10-23 MED ORDER — HALOPERIDOL 0.5 MG PO TABS: 0.5000 mg | ORAL_TABLET | ORAL | Status: DC | PRN

## 2022-10-23 MED ORDER — LORAZEPAM 2 MG/ML PO CONC: 1.0000 mg | ORAL | Status: DC | PRN

## 2022-10-23 MED ORDER — ONDANSETRON 4 MG PO TBDP: 4.0000 mg | ORAL_TABLET | Freq: Four times a day (QID) | ORAL | Status: DC

## 2022-10-23 MED ORDER — SODIUM CHLORIDE 0.9 % IV SOLN: 12.5000 mg | Freq: Four times a day (QID) | INTRAVENOUS | Status: DC | PRN

## 2022-10-23 MED ORDER — HYDROMORPHONE HCL 1 MG/ML IJ SOLN: 1.0000 mg | INTRAMUSCULAR | Status: DC | PRN

## 2022-10-23 MED ORDER — LORAZEPAM 2 MG/ML IJ SOLN
1.0000 mg | INTRAMUSCULAR | Status: DC | PRN
  Administered 2022-10-23: 1 mg via INTRAVENOUS
  Filled 2022-10-23: qty 1

## 2022-10-23 MED ORDER — HALOPERIDOL LACTATE 5 MG/ML IJ SOLN: 0.5000 mg | INTRAMUSCULAR | Status: DC | PRN

## 2022-10-23 MED ORDER — SODIUM CHLORIDE 0.9 % IV SOLN: INTRAVENOUS | Status: DC

## 2022-10-23 MED ORDER — BIOTENE DRY MOUTH MT LIQD: 15.0000 mL | OROMUCOSAL | Status: DC | PRN

## 2022-10-23 MED ORDER — POLYVINYL ALCOHOL 1.4 % OP SOLN: 1.0000 [drp] | Freq: Four times a day (QID) | OPHTHALMIC | Status: DC | PRN

## 2022-10-23 MED ORDER — ONDANSETRON HCL 4 MG/2ML IJ SOLN: 4.0000 mg | Freq: Four times a day (QID) | INTRAMUSCULAR | Status: DC | PRN

## 2022-10-23 NOTE — ED Notes (Signed)
Informed RN bed assigned 

## 2022-10-23 NOTE — TOC Initial Note (Signed)
Transition of Care [facility]) - Initial/Assessment Note    Patient Details  Name: Sandra Reyes MRN: [id number] Date of Birth: [birth year]  Transition of Care [facility]) CM/SW Contact:    Shelbie Hutching, RN Phone Number: 10/23/2022, 10:16 AM  Clinical Narrative:                 Patient's family has met with palliative care and they are interested in hospice facility placement.  Patient is having pain, nausea, and anxiety.  Margaretmary Eddy with [facility] given referral to evaluate for hospice home.  Olivia Mackie will come and meet with patient and family.   Expected Discharge Plan: Hospice Medical Facility Barriers to Discharge: Other (must enter comment) (hospice home to eval)   Patient Goals and CMS Choice Patient states their goals for this hospitalization and ongoing recovery are:: family would like patient to be evaluated for hospice home CMS Medicare.gov Compare Post Acute Care list provided to:: Patient Represenative (must comment) Choice offered to / list presented to : Adult Children  Expected Discharge Plan and Services Expected Discharge Plan: [place]   Discharge Planning Services: CM Consult                                          Prior Living Arrangements/Services                       Activities of Daily Living      Permission Sought/Granted                  Emotional Assessment         Alcohol / Substance Use: Not Applicable Psych Involvement: No (comment)  Admission diagnosis:  Acute gastric volvulus [K31.89] Patient Active Problem List   Diagnosis Date Noted   Acute gastric volvulus 10/23/2022   Unstable balance 10/08/2022   Vaginal vault prolapse after hysterectomy 07/14/2022   Vaginal discomfort 07/12/2022   Atrial fibrillation with rapid ventricular response ([place]) 03/17/2020   Atrial fibrillation ([place]) 03/17/2020   History of CVA (cerebrovascular accident) 08/21/2019   Cystocele and rectocele with complete  uterovaginal prolapse 04/12/2019   Aortic atherosclerosis ([place]) 05/15/2017   Paraesophageal hernia 12/31/2016   Diaphragmatic hernia 10/02/2016   Back skin lesion 03/15/2016   GERD (gastroesophageal reflux disease) 09/08/2015   Hyperglycemia 09/08/2015   Health care maintenance 03/10/2015   Peripheral neuropathy 02/18/2014   Vision changes 02/18/2014   Chronic back pain 05/06/2013   Anxiety 05/06/2013   Chronic anxiety 05/06/2013   Essential hypertension, benign 05/04/2013   Hypercholesterolemia 05/04/2013   PCP:  Einar Pheasant, MD Pharmacy:   [facility] [facility], [place] [address] Phone: [phone] Fax: [phone]     Social Determinants of Health (SDOH) Interventions    Readmission Risk Interventions     No data to display

## 2022-10-23 NOTE — TOC Transition Note (Signed)
Transition of Care [facility]) - CM/SW Discharge Note   Patient Details  Name: KEIASIA CHRISTIANSON MRN: [id number] Date of Birth: 28-Jul-1930  Transition [facility] Care [facility]) CM/SW Contact:  Shelbie Hutching, RN Phone Number: 10/23/2022, 1:34 PM   Clinical Narrative:    Patient will discharge to [facility] in [place] today.  Olivia Mackie with [facility] has arranged EMS.   Final next level of care: [place] Barriers to Discharge: Barriers Resolved   Patient Goals and CMS Choice Patient states their goals for this hospitalization and ongoing recovery are:: family would like patient to be evaluated for hospice home CMS Medicare.gov Compare Post Acute Care list provided to:: Patient Represenative (must comment) Choice offered to / list presented to : Adult Children  Discharge Placement              Patient chooses bed at:  [facility]) Patient to be transferred to facility by: [facility]   Patient and family notified of of transfer: 10/23/22  Discharge Plan and Services   Discharge Planning Services: CM Consult                                 Social Determinants of Health (SDOH) Interventions     Readmission Risk Interventions     No data to display

## 2022-10-23 NOTE — ED Notes (Addendum)
Report given to Judson Roch at [facility] in [place]

## 2022-10-23 NOTE — ED Provider Notes (Signed)
----------------------------------------- 12:06 AM on 10/23/2022 -----------------------------------------  Assuming care from Dr. Charna Archer.  In short, Sandra Reyes is a 88 y.o. female with a chief complaint of abdominal pain apparently caused by gastric volvulus with gastric outlet obstruction.  She had a very similar presentation in 2017 requiring extensive surgery at [facility] .  refer to the original H&P for additional details.  The current plan of care is to talk with the [facility] transfer center about emergent transfer given no surgical options available at [facility].  At this point the patient is uncertain about how she would like to proceed but Dr. Charna Archer spoke with the patient and her daughters and they understand that this is a life threatening condition that requires emergent intervention.   Clinical Course as of 10/23/22 0320  Ludwig Clarks Oct 23, 2022  0254 I went back in to reassess the patient and to let them know that we were awaiting callback from [facility].  The patient is having worsening nausea and I ordered droperidol 1.25 mg IV.  However, she and her daughter are very adamant that the patient does not want to be transported to [facility], and that she does not want any aggressive interventions.  She went through almost exactly the same diagnosis and surgery for it about 6 years ago and she says she does not want to go through that again.  They also state that they were told at [facility] [place] that given her age and comorbidities she would not be eligible for surgery again anyway, and that they knew that this recurrence was a possibility.  I had an extensive conversation with them about the gravity of this diagnosis and the poor prognosis if it goes untreated including permanent disability and death.  They understand and repeatedly expressed the patient's desire for palliative care and comfort care measures.   The patient is awake and alert and has a capacity make her own decisions and her daughter, who is a healthcare  power of attorney, supports her decision.  In the meantime, I heard back from [place] at the [facility] transfer center that the patient had been accepted.  But after my extensive conversation with the patient and the daughter, I will go with their wishes and consult the hospitalist at [facility] for admission for pain control, palliative/comfort care consults, and I also explained to the patient that likely our local surgeon will need to consult in the morning to provide additional insight into the prognosis.  They understand and agree with this plan. [CF]  0320 I consulted by phone with Dr. Myna Hidalgo with the hospitalist service.  He understands the complex situation and will admit the patient for further management. [CF]  0320 Based on my conversation with the patient and her daughter, I also filled out the [facility] DNR form and put in the [facility] order for DNR.  I confirmed again with the patient and her daughter that they understand that this means that no her resuscitative efforts will be initiated, and they are both in strong agreement with this plan. [CF]    Clinical Course User Index [CF] Hinda Kehr, MD     Medications  HYDROmorphone (DILAUDID) injection 1 mg (has no administration in time range)  lactated ringers bolus 1,000 mL (0 mLs Intravenous Stopped 10/22/22 2215)  ondansetron (ZOFRAN) injection 4 mg (4 mg Intravenous Given 10/22/22 2159)  morphine (PF) 4 MG/ML injection 4 mg (4 mg Intravenous Given 10/22/22 2158)  iohexol (OMNIPAQUE) 300 MG/ML solution 100 mL (100 mLs Intravenous Contrast Given 10/22/22 2300)  HYDROmorphone (DILAUDID) injection 0.5 mg (0.5 mg Intravenous Given 10/23/22 0003)  droperidol (INAPSINE) 2.5 MG/ML injection 1.25 mg (1.25 mg Intravenous Given 10/23/22 0306)     ED Discharge Orders     None      Final diagnoses:  Gastric volvulus  Gastric outlet obstruction  DNR (do not resuscitate)     Hinda Kehr, MD 10/23/22 0320

## 2022-10-23 NOTE — Consult Note (Signed)
Consultation Note Date: 10/23/2022 at [place]  Patient Name: Sandra Reyes  DOB: March 14, 1930  MRN: [id number]  Age / Sex: 88 y.o., female  PCP: Einar Pheasant, MD Referring Physician: Antonieta Pert, MD  Reason for Consultation: Establishing goals of care  HPI/Patient Profile: 88 y.o. female  with past medical history of hiatal hernia, anxiety, admitted on 10/22/2022 with abdominal pain and nausea.   Clinical Assessment and Goals of Care: I have reviewed medical records including EPIC notes, labs and imaging, assessed the patient and then met with patient and her daughter Judeen Hammans at bedside to discuss diagnosis prognosis, [place], EOL wishes, disposition and options. Patient is alert and oriented but sleepy and c/o dry mouth. She is able to speak and make her wishes known, but daughter Judeen Hammans able to provide history as well.  I introduced Palliative Medicine as specialized medical care for people living with serious illness. It focuses on providing relief from the symptoms and stress of a serious illness. The goal is to improve quality of life for both the patient and the family.  We discussed a brief life review of the patient. Patient lives in [place], [place] and has four children (2 sons, 2 dtrs). Her two daughters act as joint HCPOA. She was able to see 25+ family members yesterday (Thanksgiving Day) for dinner at her home. Daughter shares it was a "blessing" to be with everyone yesterday.  We discussed patient's current illness and what it means in the larger context of patient's on-going co-morbidities.  Patient has been dealing with stomach issues since surgery in 2017. Judeen Hammans says patient has known what to eat/not to eat and how to take small bites of certain foods as not to maker her nauseous. Judeen Hammans says patient does not want any heroic measures or surgeries. She just wants to be kept comfortable. Patient nodded her  head in agreement.   Pt has c/o nausea at the moment and anti-emetics adjusted appropriate in [facility].  I attempted to elicit values and goals of care important to the patient. Patient says she "wants to just go home". Judeen Hammans is in agreement in saying she doesn't want to have to move her and readjust her more than necessary. We discussed avoiding admission to med-surg unit upstairs and allowing hospice to evaluated patient in ED. Patient again shared she does not want to be at the hospital.  Patient asked what her options were as far as hospice home. She shares she would like to either go home or go to the hospice home. Hospice philosophy briefly discussed.    Given patient's request for hospice inpatient placement, I secure chatted Dr. Lupita Leash and [facility] SW/CM Pryor Montes to request hospice evaluation. I also advised ED RN Caryl Pina to pause on transferring patient to med surg as hospice evaluation is pending. Pryor Montes contacted [place] who will be here within the hour to evaluated patient's hospice status.   I adjusted medication in MAR to reflect full comfort measures. Discussed changes with ED RN Caryl Pina. I requested biotene administration  from RN, who has requested it to be sent from pharmacy.   Questions and concerns were addressed. The family was encouraged to call with questions or concerns.   Hospice eval pending. DNR remains. PMT will continue to follow the patient throughout her hospitalization.   Primary Decision Maker PATIENT  Physical Exam Vitals reviewed.  Constitutional:      Appearance: She is well-developed.  HENT:     Head: Normocephalic.  Cardiovascular:     Rate and Rhythm: Normal rate.     Heart sounds: Normal heart sounds.  Pulmonary:     Effort: Pulmonary effort is normal.  Abdominal:     Tenderness: There is abdominal tenderness.  Musculoskeletal:        General: Normal range of motion.     Comments: [facility]  Neurological:     Mental Status: She is alert  and oriented to person, place, and time.  Psychiatric:        Mood and Affect: Mood normal.        Behavior: Behavior normal.     Palliative Assessment/Data: 20%     Thank you for this consult. Palliative medicine will continue to follow and assist holistically.   Time Total: 75 minutes Greater than 50%  of this time was spent counseling and coordinating care related to the above assessment and plan.  Signed by: Jordan Hawks, DNP, FNP-BC Palliative Medicine    Please contact Palliative Medicine Team phone at [phone] for questions and concerns.  For individual provider: See Shea Evans

## 2022-10-23 NOTE — H&P (Signed)
History and Physical    Sandra Reyes GUY:[id number] DOB: [birth year] DOA: 10/22/2022  PCP: Einar Pheasant, MD   Patient coming from: Home   Chief Complaint: Abdominal pain, nausea   HPI: Sandra Reyes is a pleasant 88 y.o. female with medical history significant for hypertension, anxiety, atrial fibrillation on Eliquis, and hiatal hernia with history of gastric volvulus with hiatal hernia status post repair at [facility] in 2017 who presents emergency department with acute onset of abdominal pain and nausea.    Patient had been in her usual state until developing severe abdominal pain and nausea shortly after eating last night.  Patient states that she had a difficult time with recovery from her paraesophageal hernia repair in 2017 and would not want to undergo another surgery under any circumstances.  Her daughters are supportive of this decision.  Patient and her family ask that we just focus on keeping her comfortable and expressed interest in residential hospice if available.  ED Course: Upon arrival to the ED, patient is found to be afebrile and saturating adequately on room air with slightly elevated heart rate and stable blood pressure.  ED workup most notable for large hiatal hernia containing entire stomach on CT with apparent gastric volvulus and possible gastric outlet obstruction.  Patient was initially excepted for transfer to [facility] for surgical evaluation, but the patient refuses transfer, indicating that she only desires symptom management.  She also refuses NG tube decompression.  Her daughters are supportive of her decision.   Review of Systems:  All other systems reviewed and apart from HPI, are negative.  Past Medical History:  Diagnosis Date   Anxiety    Collagen vascular disease ([place])    Degenerative disc disease    Depression    GERD (gastroesophageal reflux disease)    Hernia, hiatal    Hyperlipidemia    Hypertension     Past Surgical History:  Procedure  Laterality Date   ABDOMINAL HYSTERECTOMY  1997   uterus and cervix removed, reports she still has her ovaries   BREAST SURGERY  1960 to 1970   lumps removed   CYSTOCELE REPAIR     ESOPHAGOGASTRODUODENOSCOPY (EGD) WITH PROPOFOL N/A 10/03/2016   Procedure: ESOPHAGOGASTRODUODENOSCOPY (EGD) WITH PROPOFOL;  Surgeon: Manya Silvas, MD;  Location: [facility];  Service: Endoscopy;  Laterality: N/A;   RECTOCELE REPAIR      Social History:   reports that she has never smoked. She has never used smokeless tobacco. She reports that she does not drink alcohol and does not use drugs.  Allergies  Allergen Reactions   Celexa [Citalopram]    Effexor [Venlafaxine]    Lipitor [Atorvastatin]    Macrobid [Nitrofurantoin Macrocrystal]    Nortriptyline    Penicillin V Potassium     Has patient had a PCN reaction causing immediate rash, facial/tongue/throat swelling, SOB or lightheadedness with hypotension: Yes Has patient had a PCN reaction causing severe rash involving mucus membranes or skin necrosis: No Has patient had a PCN reaction that required hospitalization No Has patient had a PCN reaction occurring within the last 10 years: No If all of the above answers are "NO", then may proceed with Cephalosporin use.   Prozac [Fluoxetine]    Sulfa Antibiotics Rash    Family History  Problem Relation Age of Onset   Heart disease Mother    Arthritis Mother    Ulcers Father        uremic poisoning   Arthritis Sister  x 4   Hyperlipidemia Sister        x 1   Hypertension Sister    Cancer Sister        x 2 (ovarian cancer) & 1 (colon cancer)   Diabetes Sister    Dementia Brother    Dementia Brother      Prior to Admission medications   Medication Sig Start Date End Date Taking? Authorizing Provider  ALPRAZolam Duanne Moron) 0.5 MG tablet TAKE 1/2 TABLET BY MOUTH ONCE DAILY AND 1 AT BEDTIME 09/07/22   Einar Pheasant, MD  apixaban (ELIQUIS) 5 MG TABS tablet Take 1 tablet (5 mg total) by  mouth 2 (two) times daily. 07/29/22   Einar Pheasant, MD  diltiazem (CARDIZEM CD) 120 MG 24 hr capsule Take 1 capsule (120 mg total) by mouth daily. Patient taking differently: Take 120 mg by mouth daily. 2 tablets daily 03/20/20   Dhungel, Nishant, MD  famotidine (PEPCID) 20 MG tablet TAKE 1 TABLET BY MOUTH  DAILY 01/31/21   Einar Pheasant, MD  losartan (COZAAR) 25 MG tablet Take 25 mg by mouth daily. 03/06/20   [provider]  Multiple Vitamin (MULTIVITAMIN) tablet Take 2 tablets by mouth daily. Uses 2 gummies daily    [provider]    Physical Exam: Vitals:   10/23/22 0000 10/23/22 0100 10/23/22 0200 10/23/22 0300  BP: (!) 152/84 138/74 (!) 142/68 136/76  Pulse: (!) 103 73 98 97  Resp: '14 13 11 12  '$ Temp:    98.2 F (36.8 C)  TempSrc:    Oral  SpO2: 95% 94% 93% 91%  Weight:      Height:        Constitutional: NAD, calm  Eyes: PERTLA, lids and conjunctivae normal ENMT: Mucous membranes are moist. Posterior pharynx clear of any exudate or lesions.   Neck: supple, no masses  Respiratory: no wheezing, no crackles. No accessory muscle use.  Cardiovascular: Rate ~100 and regular. No extremity edema. Abdomen: soft. Tender without peritoneal signs. Bowel sounds hypoactive.  Musculoskeletal: no clubbing / cyanosis. No joint deformity upper and lower extremities.   Skin: no significant rashes, lesions, ulcers. Warm, dry, well-perfused. Neurologic: CN 2-12 grossly intact. Moving all extremities. Alert and oriented.  Psychiatric: Pleasant. Cooperative.    Labs and Imaging on Admission: I have personally reviewed following labs and imaging studies  CBC: Recent Labs  Lab 10/22/22 2154  WBC 11.3*  NEUTROABS 9.5*  HGB 14.2  HCT 42.4  MCV 91.4  PLT 025   Basic Metabolic Panel: Recent Labs  Lab 10/22/22 2154  NA 141  K 3.9  CL 99  CO2 26  GLUCOSE 176*  BUN 17  CREATININE 0.79  CALCIUM 9.1   GFR: Estimated Creatinine Clearance: 41.2 mL/min (by C-G formula  based on SCr of 0.79 mg/dL). Liver Function Tests: Recent Labs  Lab 10/22/22 2154  AST 26  ALT 16  ALKPHOS 75  BILITOT 0.8  PROT 7.7  ALBUMIN 4.3   Recent Labs  Lab 10/22/22 2154  LIPASE 34   No results for input(s): "AMMONIA" in the last 168 hours. Coagulation Profile: No results for input(s): "INR", "PROTIME" in the last 168 hours. Cardiac Enzymes: No results for input(s): "CKTOTAL", "CKMB", "CKMBINDEX", "TROPONINI" in the last 168 hours. BNP (last 3 results) No results for input(s): "PROBNP" in the last 8760 hours. HbA1C: No results for input(s): "HGBA1C" in the last 72 hours. CBG: No results for input(s): "GLUCAP" in the last 168 hours. Lipid Profile: No results for  input(s): "CHOL", "HDL", "LDLCALC", "TRIG", "CHOLHDL", "LDLDIRECT" in the last 72 hours. Thyroid Function Tests: No results for input(s): "TSH", "T4TOTAL", "FREET4", "T3FREE", "THYROIDAB" in the last 72 hours. Anemia Panel: No results for input(s): "VITAMINB12", "FOLATE", "FERRITIN", "TIBC", "IRON", "RETICCTPCT" in the last 72 hours. Urine analysis:    Component Value Date/Time   COLORURINE STRAW (A) 10/22/2022 2326   APPEARANCEUR CLEAR (A) 10/22/2022 2326   LABSPEC 1.018 10/22/2022 2326   PHURINE 7.0 10/22/2022 2326   GLUCOSEU 50 (A) 10/22/2022 2326   HGBUR NEGATIVE 10/22/2022 2326   BILIRUBINUR NEGATIVE 10/22/2022 2326   BILIRUBINUR neg 07/06/2022 1624   KETONESUR 5 (A) 10/22/2022 2326   PROTEINUR NEGATIVE 10/22/2022 2326   UROBILINOGEN 0.2 07/06/2022 1624   NITRITE NEGATIVE 10/22/2022 2326   LEUKOCYTESUR TRACE (A) 10/22/2022 2326   Sepsis Labs: '@LABRCNTIP'$ (procalcitonin:4,lacticidven:4) )No results found for this or any previous visit (from the past 240 hour(s)).   Radiological Exams on Admission: CT Abdomen Pelvis W Contrast  Result Date: 10/22/2022 CLINICAL DATA:  Abdominal pain. EXAM: CT ABDOMEN AND PELVIS WITH CONTRAST TECHNIQUE: Multidetector CT imaging of the abdomen and pelvis was  performed using the standard protocol following bolus administration of intravenous contrast. RADIATION DOSE REDUCTION: This exam was performed according to the departmental dose-optimization program which includes automated exposure control, adjustment of the mA and/or kV according to patient size and/or use of iterative reconstruction technique. CONTRAST:  171m OMNIPAQUE IOHEXOL 300 MG/ML  SOLN COMPARISON:  CT abdomen pelvis dated 10/02/2016. FINDINGS: Lower chest: Minimal bibasilar dependent atelectasis. There is coronary vascular calcification. There is a partially visualized large hiatal hernia containing the entire stomach which has increased in size since the prior CT. There is apparent gastric volvulus. The stomach is distended with fluid content and findings concerning for a degree of gastric outlet obstruction. Upper GI study or small-bowel series may provide better evaluation. There is moderate mass effect and compression of the heart by the large hiatal hernia. No intra-abdominal free air or free fluid. Hepatobiliary: The liver is unremarkable. Small right liver cyst. No biliary dilatation. The gallbladder is unremarkable. Pancreas: Unremarkable. No pancreatic ductal dilatation or surrounding inflammatory changes. Spleen: Normal in size without focal abnormality. Adrenals/Urinary Tract: The adrenal glands are unremarkable. There is a 7.5 cm right renal interpolar cyst. Indeterminate hypoenhancing solid lesion in the superior pole of the right kidney measures 2.5 cm and not characterized on this CT. Further characterization with MRI without and with contrast on a nonemergent/outpatient basis recommended. There is no hydronephrosis on either side. There is symmetric enhancement and excretion of contrast by both kidneys. The visualized ureters and urinary bladder appear unremarkable. Stomach/Bowel: There is sigmoid diverticulosis without active inflammatory changes. There is herniation of a segment of the  transverse colon into the hiatal hernia. The appendix is normal. Vascular/Lymphatic: Moderate aortoiliac atherosclerotic disease. The IVC is unremarkable. No portal venous gas. There is no adenopathy. Reproductive: Hysterectomy.  A pessary is noted.  No adnexal masses. Other: There is a small right inguinal hernia containing a loop of small bowel. No evidence of obstruction or inflammation. Musculoskeletal: Osteopenia with scoliosis and degenerative changes of the spine. No acute osseous pathology. IMPRESSION: 1. Partially visualized large hiatal hernia containing the entire stomach which has increased in size since the prior CT. There is apparent gastric volvulus with findings of possible gastric outlet obstruction. Upper GI study or small-bowel series may provide better evaluation. 2. Sigmoid diverticulosis.  Normal appendix. 3. Small right inguinal hernia containing a loop of small bowel.  No evidence of obstruction or inflammation. 4. Indeterminate hypoenhancing solid lesion in the superior pole of the right kidney. Further characterization with MRI without and with contrast on a nonemergent/outpatient basis recommended. 5.  Aortic Atherosclerosis (ICD10-I70.0). Electronically Signed   By: Anner Crete M.D.   On: 10/22/2022 23:24    EKG: Independently reviewed. Sinus rhythm, LVH.   Assessment/Plan  1. Large hiatal hernia with acute gastric volvulus  - Patient understands the diagnosis, is not interested in surgery under any circumstances, and does not want NGT decompression but only comfort measures; her daughters are supportive of her decision and inquire about residential hospice   - Continue symptom management, consult palliative care    2. Anxiety  - Continue benzodiazepine   3. Atrial fibrillation; hypertension  - Patient wants to focus on symptom management/comfort only     DVT prophylaxis: none, comfort measures only  Code Status: DNR  Level of Care: Level of care: Palliative  Care Family Communication: Daughter at bedside  Disposition Plan:  Patient is from: home  Anticipated d/c is to: Hospice  Anticipated d/c date is: 10/26/22  Patient currently: Pending disposition planning  Consults called: Surgery consulted by ED  Admission status: Inpatient     Vianne Bulls, MD [facility]  10/23/2022, 4:05 AM

## 2022-10-23 NOTE — ED Notes (Signed)
Pt accepted to [facility] per Mariann Laster

## 2022-10-23 NOTE — Progress Notes (Addendum)
[facility] ED [address] [facility]) Hospital Liaison Note  Received request from Doran Clay, RN with [facility] for family interest in [facility]. Met with patient and daughter Judeen Hammans at bedside to confirm interest and explain services. Chart reviewed and hospice eligibility confirmed.  Patient and family agreeable to transfer today. Judeen Hammans has gone to [facility] to sign consents. Once complete, I will arrange for ambulance transport.  Please send completed and signed DNR with patient.  RN please, leave IV's in place and please call report to [phone].  Please call with any questions.  Thank you, Margaretmary Eddy, BSN, RN [facility] Liaison [phone]

## 2022-10-23 NOTE — Hospital Course (Signed)
88 y.o.f  w/ hypertension, A-fib on Eliquis,history of hiatal hernia/gastric volvulus status post repair at [facility] in 2017 presented to the ED with acute onset abdominal pain, nausea since 1123/night. Patient rerported she had a difficult time with recovery from her paraesophageal hernia repair in 2017 and would not want to undergo another surgery under any circumstances.Her daughters are supportive of this decision. Patient and her family ask that we just focus on keeping her comfortable and expressed interest in residential hospice if available. In the ED afebrile, saturating well on room air. Underwent CT abdomen pelvis with contrast showing large hiatal hernia containing entire stomach increased in size from prior CT, apparent gastric volvulus with findings of possible gastric outlet obstruction, sigmoid diverticulosis, a small right inguinal hernia containing loop of small bowel, indeterminate hypoechoic solid lesion in the superior pole of the right kidney.  Patient was initially excepted for transfer to [facility] for surgical evaluation, but the patient refuses transfer, indicating that she only desires symptom management.  She also refuses NG tube decompression.  Her daughters are supportive of her decision.  Patient admitted for symptom management comfort care with palliative care consultation

## 2022-10-23 NOTE — ED Notes (Signed)
Called [facility] transfer Sandra Reyes) for update on surgery to retuen call.  She will repaged

## 2022-10-23 NOTE — TOC Progression Note (Signed)
Transition of Care [facility]) - Progression Note    Patient Details  Name: Sandra Reyes MRN: [id number] Date of Birth: 07/09/1930  Transition of Care [facility] CM/SW Contact  Shelbie Hutching, RN Phone Number: 10/23/2022, 10:49 AM  Clinical Narrative:    Patient has been approved for the [facility] home.  Olivia Mackie with [facility] meeting with family at the bedside now.    Expected Discharge Plan: Hospice Medical Facility Barriers to Discharge: Other (must enter comment) (hospice home to eval)  Expected Discharge Plan and Services Expected Discharge Plan: [facility]   Discharge Planning Services: CM Consult                                           Social Determinants of Health (SDOH) Interventions    Readmission Risk Interventions     No data to display

## 2022-10-23 NOTE — ED Notes (Signed)
Karma Greaser, MD informed pt about being accepted to [facility].. pt doesn't want to go at this time.

## 2022-10-23 NOTE — Discharge Summary (Signed)
Physician Discharge Summary  Sandra Reyes IRC:[id number] DOB: Jun 24, 1930 DOA: 10/22/2022  PCP: Einar Pheasant, MD  Admit date: 10/22/2022 Discharge date: 10/23/2022 Recommendations for Outpatient Follow-up:  Follow up with hospice house today  Discharge Dispo: [place] Discharge Condition: Stable Code Status:   Code Status: DNR Diet recommendation:  Diet Order             Diet NPO time specified Except for: Ice Chips  Diet effective now                    Brief/Interim Summary:  88 y.o.f  w/ hypertension, A-fib on Eliquis,history of hiatal hernia/gastric volvulus status post repair at [facility] in 2017 presented to the ED with acute onset abdominal pain, nausea since 1123/night. Patient rerported she had a difficult time with recovery from her paraesophageal hernia repair in 2017 and would not want to undergo another surgery under any circumstances.Her daughters are supportive of this decision. Patient and her family ask that we just focus on keeping her comfortable and expressed interest in residential hospice if available. In the ED afebrile, saturating well on room air. Underwent CT abdomen pelvis with contrast showing large hiatal hernia containing entire stomach increased in size from prior CT, apparent gastric volvulus with findings of possible gastric outlet obstruction, sigmoid diverticulosis, a small right inguinal hernia containing loop of small bowel, indeterminate hypoechoic solid lesion in the superior pole of the right kidney.  Patient was initially excepted for transfer to [facility] for surgical evaluation, but the patient refuses transfer, indicating that she only desires symptom management.  She also refuses NG tube decompression.  Her daughters are supportive of her decision.  Patient admitted for symptom management comfort care with palliative care consultation  Patient seen by palliative care, Hospice has been approved and she will be discharged  there  today  Discharge Diagnoses:  Principal Problem:   Acute gastric volvulus Active Problems:   Essential hypertension, benign   Atrial fibrillation ([place])   Chronic anxiety End-of-life care: Patient has been approved for hospice house today seen by palliative care and is being discharged to hospice house as per patient's and family's request  Consults: Palliative care Subjective: Sleepy alert awake, daughter at the bedside  Discharge Exam: Vitals:   10/23/22 0700 10/23/22 0753  BP:  (!) 162/66  Pulse: 99 95  Resp: 12 12  Temp:    SpO2: 95% 100%   General: Pt is alert, awake, not in acute distress Cardiovascular: RRR, S1/S2 +, no rubs, no gallops Respiratory: CTA bilaterally, no wheezing, no rhonchi Abdominal: Soft, NT, ND, bowel sounds + Extremities: no edema, no cyanosis  Discharge Instructions   Allergies as of 10/23/2022       Reactions   Celexa [citalopram]    Effexor [venlafaxine]    Lipitor [atorvastatin]    Macrobid [nitrofurantoin Macrocrystal]    Nortriptyline    Penicillin V Potassium    Has patient had a PCN reaction causing immediate rash, facial/tongue/throat swelling, SOB or lightheadedness with hypotension: Yes Has patient had a PCN reaction causing severe rash involving mucus membranes or skin necrosis: No Has patient had a PCN reaction that required hospitalization No Has patient had a PCN reaction occurring within the last 10 years: No If all of the above answers are "NO", then may proceed with Cephalosporin use.   Prozac [fluoxetine]    Sulfa Antibiotics Rash        Medication List     STOP taking these medications  ALPRAZolam 0.5 MG tablet Commonly known as: XANAX   apixaban 5 MG Tabs tablet Commonly known as: ELIQUIS   diltiazem 120 MG 24 hr capsule Commonly known as: CARDIZEM CD   famotidine 20 MG tablet Commonly known as: PEPCID   losartan 25 MG tablet Commonly known as: COZAAR   multivitamin tablet        Allergies   Allergen Reactions   Celexa [Citalopram]    Effexor [Venlafaxine]    Lipitor [Atorvastatin]    Macrobid [Nitrofurantoin Macrocrystal]    Nortriptyline    Penicillin V Potassium     Has patient had a PCN reaction causing immediate rash, facial/tongue/throat swelling, SOB or lightheadedness with hypotension: Yes Has patient had a PCN reaction causing severe rash involving mucus membranes or skin necrosis: No Has patient had a PCN reaction that required hospitalization No Has patient had a PCN reaction occurring within the last 10 years: No If all of the above answers are "NO", then may proceed with Cephalosporin use.   Prozac [Fluoxetine]    Sulfa Antibiotics Rash    The results of significant diagnostics from this hospitalization (including imaging, microbiology, ancillary and laboratory) are listed below for reference.    Microbiology: No results found for this or any previous visit (from the past 240 hour(s)).  Procedures/Studies: CT Abdomen Pelvis W Contrast  Result Date: 10/22/2022 CLINICAL DATA:  Abdominal pain. EXAM: CT ABDOMEN AND PELVIS WITH CONTRAST TECHNIQUE: Multidetector CT imaging of the abdomen and pelvis was performed using the standard protocol following bolus administration of intravenous contrast. RADIATION DOSE REDUCTION: This exam was performed according to the departmental dose-optimization program which includes automated exposure control, adjustment of the mA and/or kV according to patient size and/or use of iterative reconstruction technique. CONTRAST:  141m OMNIPAQUE IOHEXOL 300 MG/ML  SOLN COMPARISON:  CT abdomen pelvis dated 10/02/2016. FINDINGS: Lower chest: Minimal bibasilar dependent atelectasis. There is coronary vascular calcification. There is a partially visualized large hiatal hernia containing the entire stomach which has increased in size since the prior CT. There is apparent gastric volvulus. The stomach is distended with fluid content and findings  concerning for a degree of gastric outlet obstruction. Upper GI study or small-bowel series may provide better evaluation. There is moderate mass effect and compression of the heart by the large hiatal hernia. No intra-abdominal free air or free fluid. Hepatobiliary: The liver is unremarkable. Small right liver cyst. No biliary dilatation. The gallbladder is unremarkable. Pancreas: Unremarkable. No pancreatic ductal dilatation or surrounding inflammatory changes. Spleen: Normal in size without focal abnormality. Adrenals/Urinary Tract: The adrenal glands are unremarkable. There is a 7.5 cm right renal interpolar cyst. Indeterminate hypoenhancing solid lesion in the superior pole of the right kidney measures 2.5 cm and not characterized on this CT. Further characterization with MRI without and with contrast on a nonemergent/outpatient basis recommended. There is no hydronephrosis on either side. There is symmetric enhancement and excretion of contrast by both kidneys. The visualized ureters and urinary bladder appear unremarkable. Stomach/Bowel: There is sigmoid diverticulosis without active inflammatory changes. There is herniation of a segment of the transverse colon into the hiatal hernia. The appendix is normal. Vascular/Lymphatic: Moderate aortoiliac atherosclerotic disease. The IVC is unremarkable. No portal venous gas. There is no adenopathy. Reproductive: Hysterectomy.  A pessary is noted.  No adnexal masses. Other: There is a small right inguinal hernia containing a loop of small bowel. No evidence of obstruction or inflammation. Musculoskeletal: Osteopenia with scoliosis and degenerative changes of the spine. No acute  osseous pathology. IMPRESSION: 1. Partially visualized large hiatal hernia containing the entire stomach which has increased in size since the prior CT. There is apparent gastric volvulus with findings of possible gastric outlet obstruction. Upper GI study or small-bowel series may provide  better evaluation. 2. Sigmoid diverticulosis.  Normal appendix. 3. Small right inguinal hernia containing a loop of small bowel. No evidence of obstruction or inflammation. 4. Indeterminate hypoenhancing solid lesion in the superior pole of the right kidney. Further characterization with MRI without and with contrast on a nonemergent/outpatient basis recommended. 5.  Aortic Atherosclerosis (ICD10-I70.0). Electronically Signed   By: Anner Crete M.D.   On: 10/22/2022 23:24    Labs: BNP (last 3 results) No results for input(s): "BNP" in the last 8760 hours. Basic Metabolic Panel: Recent Labs  Lab 10/22/22 2154  NA 141  K 3.9  CL 99  CO2 26  GLUCOSE 176*  BUN 17  CREATININE 0.79  CALCIUM 9.1   Liver Function Tests: Recent Labs  Lab 10/22/22 2154  AST 26  ALT 16  ALKPHOS 75  BILITOT 0.8  PROT 7.7  ALBUMIN 4.3   Recent Labs  Lab 10/22/22 2154  LIPASE 34   No results for input(s): "AMMONIA" in the last 168 hours. CBC: Recent Labs  Lab 10/22/22 2154  WBC 11.3*  NEUTROABS 9.5*  HGB 14.2  HCT 42.4  MCV 91.4  PLT 303   Cardiac Enzymes: No results for input(s): "CKTOTAL", "CKMB", "CKMBINDEX", "TROPONINI" in the last 168 hours. BNP: Invalid input(s): "POCBNP" CBG: No results for input(s): "GLUCAP" in the last 168 hours. D-Dimer No results for input(s): "DDIMER" in the last 72 hours. Hgb A1c No results for input(s): "HGBA1C" in the last 72 hours. Lipid Profile No results for input(s): "CHOL", "HDL", "LDLCALC", "TRIG", "CHOLHDL", "LDLDIRECT" in the last 72 hours. Thyroid function studies No results for input(s): "TSH", "T4TOTAL", "T3FREE", "THYROIDAB" in the last 72 hours.  Invalid input(s): "FREET3" Anemia work up No results for input(s): "VITAMINB12", "FOLATE", "FERRITIN", "TIBC", "IRON", "RETICCTPCT" in the last 72 hours. Urinalysis    Component Value Date/Time   COLORURINE STRAW (A) 10/22/2022 2326   APPEARANCEUR CLEAR (A) 10/22/2022 2326   LABSPEC 1.018  10/22/2022 2326   PHURINE 7.0 10/22/2022 2326   GLUCOSEU 50 (A) 10/22/2022 2326   HGBUR NEGATIVE 10/22/2022 2326   BILIRUBINUR NEGATIVE 10/22/2022 2326   BILIRUBINUR neg 07/06/2022 1624   KETONESUR 5 (A) 10/22/2022 2326   PROTEINUR NEGATIVE 10/22/2022 2326   UROBILINOGEN 0.2 07/06/2022 1624   NITRITE NEGATIVE 10/22/2022 2326   LEUKOCYTESUR TRACE (A) 10/22/2022 2326   Sepsis Labs Recent Labs  Lab 10/22/22 2154  WBC 11.3*   Microbiology No results found for this or any previous visit (from the past 240 hour(s)).   Time coordinating discharge: 25 minutes  SIGNED: Antonieta Pert, MD  [facility] 10/23/2022, 11:12 AM  If 7PM-7AM, please contact night-coverage [email or web address]

## 2022-10-23 NOTE — ED Notes (Signed)
Called [facility] transfer Mariann Laster) for Charna Archer, MD to speak w/ surgery MD in reference to a transfer

## 2022-10-23 NOTE — Progress Notes (Signed)
88 y.o.f  w/ hypertension, A-fib on Eliquis,history of hiatal hernia/gastric volvulus status post repair at [facility] in 2017 presented to the ED with acute onset abdominal pain, nausea since 1123/night. Patient rerported she had a difficult time with recovery from her paraesophageal hernia repair in 2017 and would not want to undergo another surgery under any circumstances.Her daughters are supportive of this decision. Patient and her family ask that we just focus on keeping her comfortable and expressed interest in residential hospice if available. In the ED afebrile, saturating well on room air. Underwent CT abdomen pelvis with contrast showing large hiatal hernia containing entire stomach increased in size from prior CT, apparent gastric volvulus with findings of possible gastric outlet obstruction, sigmoid diverticulosis, a small right inguinal hernia containing loop of small bowel, indeterminate hypoechoic solid lesion in the superior pole of the right kidney.  Patient was initially excepted for transfer to [facility] for surgical evaluation, but the patient refuses transfer, indicating that she only desires symptom management.  She also refuses NG tube decompression.  Her daughters are supportive of her decision.  Patient admitted for symptom management comfort care with palliative care consultation  Seen and examined this morning mildly sleepy.Daughter at the bedside. Denies nausea vomiting rest comfortable TOC consulted/PMT consulted for hospice home Issues: Large hiatal hernia with acute gastric volvulus Hypertension Atrial fibrillation Anxiety disorder  End-of-life care

## 2023-01-01 ENCOUNTER — Telehealth: Payer: Self-pay

## 2023-01-01 NOTE — Telephone Encounter (Signed)
Patient states she has been released from Hospice and is back home.  Patient states she has been back home for about 6 weeks, and a Hospice nurse is coming to see her.  Patient states Hospice instructed her not to take any medication except Ativan.  Patient states she was on lots of medication prior to going into Hospice and one medicine was for Afib.  Patient states her heart has been beating fine.  Patient states she takes a baby aspirin everyday now.  Patient states she is checking to see if she has an appointment scheduled with Dr. Einar Pheasant.  I let patient know that she does not have an appointment scheduled at this time to see Dr. Nicki Reaper, but I will send Dr. Nicki Reaper a message with the information she shared with me today.

## 2023-01-01 NOTE — Telephone Encounter (Signed)
Called patient to confirm what is needed. Patient is currently being followed by hospice for medications, etc. She does not need anything at the moment. She was just wanting to make sure that she would still be able to be your patient if hospice discharges her in 3 months since recently she has been doing well. Patient saw you in November and stated she would like to hold off on making an appointment right now but would call back to schedule if needed or if anything changes.

## 2023-12-07 ENCOUNTER — Telehealth: Payer: Self-pay

## 2023-12-07 NOTE — Telephone Encounter (Signed)
 Curlee called from [facility] to state he has Dr. Eric Monguilod from [facility] and Palliative Care and he would like to speak with Dr. Allena Hamilton.  I was unable to reach Sueanne Shallow, LPN, at the time of the call.  I spoke with Dr. Allena Hamilton and transferred call to her.

## 2023-12-07 NOTE — Telephone Encounter (Signed)
Sending to you for documentation.

## 2023-12-07 NOTE — Telephone Encounter (Signed)
 Spoke to Dr. Elliot Gurney - pt being discharged from hospice due to improvement.  Placed in palliative care.

## 2023-12-16 ENCOUNTER — Ambulatory Visit: Payer: Medicare Other | Admitting: Nurse Practitioner

## 2023-12-16 ENCOUNTER — Ambulatory Visit: Payer: Medicare Other | Admitting: Internal Medicine

## 2023-12-16 VITALS — BP 126/70 | HR 73 | Temp 97.5°F | Resp 16 | Ht 62.0 in | Wt 156.0 lb

## 2023-12-16 DIAGNOSIS — R739 Hyperglycemia, unspecified: Secondary | ICD-10-CM | POA: Diagnosis not present

## 2023-12-16 DIAGNOSIS — I10 Essential (primary) hypertension: Secondary | ICD-10-CM

## 2023-12-16 DIAGNOSIS — E7800 Pure hypercholesterolemia, unspecified: Secondary | ICD-10-CM

## 2023-12-16 DIAGNOSIS — G6289 Other specified polyneuropathies: Secondary | ICD-10-CM | POA: Diagnosis not present

## 2023-12-16 DIAGNOSIS — I4891 Unspecified atrial fibrillation: Secondary | ICD-10-CM | POA: Diagnosis not present

## 2023-12-16 DIAGNOSIS — F419 Anxiety disorder, unspecified: Secondary | ICD-10-CM

## 2023-12-16 DIAGNOSIS — I700 Atherosclerosis of aorta: Secondary | ICD-10-CM

## 2023-12-16 DIAGNOSIS — R2689 Other abnormalities of gait and mobility: Secondary | ICD-10-CM

## 2023-12-16 DIAGNOSIS — Z8673 Personal history of transient ischemic attack (TIA), and cerebral infarction without residual deficits: Secondary | ICD-10-CM | POA: Diagnosis not present

## 2023-12-16 DIAGNOSIS — R531 Weakness: Secondary | ICD-10-CM | POA: Diagnosis not present

## 2023-12-16 LAB — CBC WITH DIFFERENTIAL/PLATELET
Basophils Absolute: 0 10*3/uL (ref 0.0–0.1)
Basophils Relative: 0.6 % (ref 0.0–3.0)
Eosinophils Absolute: 0.1 10*3/uL (ref 0.0–0.7)
Eosinophils Relative: 1 % (ref 0.0–5.0)
HCT: 44.5 % (ref 36.0–46.0)
Hemoglobin: 14.5 g/dL (ref 12.0–15.0)
Lymphocytes Relative: 20.1 % (ref 12.0–46.0)
Lymphs Abs: 1.1 10*3/uL (ref 0.7–4.0)
MCHC: 32.7 g/dL (ref 30.0–36.0)
MCV: 92.9 fL (ref 78.0–100.0)
Monocytes Absolute: 0.7 10*3/uL (ref 0.1–1.0)
Monocytes Relative: 12.8 % — ABNORMAL HIGH (ref 3.0–12.0)
Neutro Abs: 3.7 10*3/uL (ref 1.4–7.7)
Neutrophils Relative %: 65.5 % (ref 43.0–77.0)
Platelets: 330 10*3/uL (ref 150.0–400.0)
RBC: 4.78 Mil/uL (ref 3.87–5.11)
RDW: 14.3 % (ref 11.5–15.5)
WBC: 5.7 10*3/uL (ref 4.0–10.5)

## 2023-12-16 LAB — LIPID PANEL
Cholesterol: 202 mg/dL — ABNORMAL HIGH (ref 0–200)
HDL: 59.1 mg/dL (ref >39.00)
LDL Cholesterol: 126 mg/dL — ABNORMAL HIGH (ref 0–99)
NonHDL: 142.44
Total CHOL/HDL Ratio: 3
Triglycerides: 81 mg/dL (ref 0.0–149.0)
VLDL: 16.2 mg/dL (ref 0.0–40.0)

## 2023-12-16 LAB — BASIC METABOLIC PANEL
BUN: 16 mg/dL (ref 6–23)
CO2: 29 meq/L (ref 19–32)
Calcium: 9.4 mg/dL (ref 8.4–10.5)
Chloride: 97 meq/L (ref 96–112)
Creatinine, Ser: 0.74 mg/dL (ref 0.40–1.20)
GFR: 70.51 mL/min (ref >60.00)
Glucose, Bld: 113 mg/dL — ABNORMAL HIGH (ref 70–99)
Potassium: 4.4 meq/L (ref 3.5–5.1)
Sodium: 134 meq/L — ABNORMAL LOW (ref 135–145)

## 2023-12-16 LAB — HEMOGLOBIN A1C: Hgb A1c MFr Bld: 6 % (ref 4.6–6.5)

## 2023-12-16 LAB — HEPATIC FUNCTION PANEL
ALT: 15 U/L (ref 0–35)
AST: 20 U/L (ref 0–37)
Albumin: 4.3 g/dL (ref 3.5–5.2)
Alkaline Phosphatase: 66 U/L (ref 39–117)
Bilirubin, Direct: 0.1 mg/dL (ref 0.0–0.3)
Total Bilirubin: 0.4 mg/dL (ref 0.2–1.2)
Total Protein: 7.2 g/dL (ref 6.0–8.3)

## 2023-12-16 LAB — TSH: TSH: 1.45 u[IU]/mL (ref 0.35–5.50)

## 2023-12-16 MED ORDER — LORAZEPAM 1 MG PO TABS: 1.0000 mg | ORAL_TABLET | Freq: Every evening | ORAL | 2 refills | Status: DC | PRN

## 2023-12-16 MED ORDER — TRAZODONE HCL 50 MG PO TABS: 50.0000 mg | ORAL_TABLET | Freq: Every day | ORAL | 2 refills | Status: AC

## 2023-12-16 NOTE — Progress Notes (Signed)
Subjective:    [name], female    DOB: 1930/04/14, 89 y.o.   MRN: [id number]  Patient here for  Chief Complaint  Patient presents with   Medical Management of Chronic Issues    HPI Here for follow up appt - after discharge from hospice. She has a history of gastric volvulus with gastric outlet obstruction. Was admitted 09/2022 [facility] with abdominal pain. Had similar presentation in 2017 requiring extensive surgery at [facility]. It was decided during that 09/2022 visit - comfort measures. She desired no aggressive intervention. Was placed in hospice. Has been under hospice care since. Recently discharged from hospice - she has stabilized. Eating. Weight is stable. No abdominal pain. She is taking a laxative every other day to keep bowels moving. She has been off all medication. Blood pressure is doing well off medication. Discussed remaining off. Discussed afib. Discussed being off eliquis and risk for CVA. She has had some previous falls. Discussed risk of eliquis and falls. No chest pain.  Breathing stable. No increased heart rate or palpations reported.    Past Medical History:  Diagnosis Date   Anxiety    Collagen vascular disease (HCC)    Degenerative disc disease    Depression    GERD (gastroesophageal reflux disease)    Hernia, hiatal    Hyperlipidemia    Hypertension    Past Surgical History:  Procedure Laterality Date   ABDOMINAL HYSTERECTOMY  1997   uterus and cervix removed, reports she still has her ovaries   BREAST SURGERY  1960 to 1970   lumps removed   CYSTOCELE REPAIR     ESOPHAGOGASTRODUODENOSCOPY (EGD) WITH PROPOFOL N/A 10/03/2016   Procedure: ESOPHAGOGASTRODUODENOSCOPY (EGD) WITH PROPOFOL;  Surgeon: Scot Jun, MD;  Location: [facility] ENDOSCOPY;  Service: Endoscopy;  Laterality: N/A;   RECTOCELE REPAIR     Family History  Problem Relation Age of Onset   Heart disease Mother    Arthritis Mother    Ulcers Father        uremic poisoning    Arthritis Sister        x 4   Hyperlipidemia Sister        x 1   Hypertension Sister    Cancer Sister        x 2 (ovarian cancer) & 1 (colon cancer)   Diabetes Sister    Dementia Brother    Dementia Brother    Social History   Socioeconomic History   Marital status: Widowed    Spouse name: Not on file   Number of children: Not on file   Years of education: Not on file   Highest education level: Not on file  Occupational History   Not on file  Tobacco Use   Smoking status: Never   Smokeless tobacco: Never  Vaping Use   Vaping status: Never Used  Substance and Sexual Activity   Alcohol use: No    Alcohol/week: 0.0 standard drinks of alcohol   Drug use: No   Sexual activity: Not Currently    Birth control/protection: None  Other Topics Concern   Not on file  Social History Narrative   Not on file   Social Drivers of Health   Financial Resource Strain: Low Risk  (09/12/2019)   Overall Financial Resource Strain (CARDIA)    Difficulty of Paying Living Expenses: Not hard at all  Food Insecurity: No Food Insecurity (09/12/2019)   Hunger Vital Sign    Worried About Running Out  of Food in the Last Year: Never true    Ran Out of Food in the Last Year: Never true  Transportation Needs: No Transportation Needs (09/12/2019)   PRAPARE - Administrator, Civil Service (Medical): No    Lack of Transportation (Non-Medical): No  Physical Activity: Not on file  Stress: No Stress Concern Present (09/12/2019)   [employer] of Occupational Health - Occupational Stress Questionnaire    Feeling of Stress : Not at all  Social Connections: Not on file     Review of Systems  Constitutional:  Negative for appetite change and unexpected weight change.  HENT:  Negative for congestion and sinus pressure.   Respiratory:  Negative for cough, chest tightness and shortness of breath.   Cardiovascular:  Negative for chest pain and palpitations.  Gastrointestinal:  Negative  for abdominal pain, diarrhea, nausea and vomiting.  Genitourinary:  Negative for difficulty urinating and dysuria.  Musculoskeletal:  Negative for joint swelling and myalgias.  Skin:  Negative for color change and rash.  Neurological:  Negative for dizziness and headaches.  Psychiatric/Behavioral:  Negative for agitation and dysphoric mood.        Objective:     BP 126/70   Pulse 73   Temp (!) 97.5 F (36.4 C)   Resp 16   Ht 5\' 2"  (1.575 m)   Wt 156 lb (70.8 kg)   SpO2 98%   BMI 28.53 kg/m  Wt Readings from Last 3 Encounters:  12/16/23 156 lb (70.8 kg)  10/22/22 157 lb (71.2 kg)  09/30/22 160 lb 12.8 oz (72.9 kg)    Physical Exam Vitals reviewed.  Constitutional:      General: She is not in acute distress.    Appearance: Normal appearance.  HENT:     Head: Normocephalic and atraumatic.     Right Ear: External ear normal.     Left Ear: External ear normal.     Mouth/Throat:     Pharynx: No oropharyngeal exudate or posterior oropharyngeal erythema.  Eyes:     General: No scleral icterus.       Right eye: No discharge.        Left eye: No discharge.     Conjunctiva/sclera: Conjunctivae normal.  Neck:     Thyroid: No thyromegaly.  Cardiovascular:     Rate and Rhythm: Normal rate.     Comments: Rate controlled.  Pulmonary:     Effort: No respiratory distress.     Breath sounds: Normal breath sounds. No wheezing.  Abdominal:     General: Bowel sounds are normal.     Palpations: Abdomen is soft.     Tenderness: There is no abdominal tenderness.  Musculoskeletal:        General: No swelling or tenderness.     Cervical back: Neck supple. No tenderness.  Lymphadenopathy:     Cervical: No cervical adenopathy.  Skin:    Findings: No erythema or rash.  Neurological:     Mental Status: She is alert.  Psychiatric:        Mood and Affect: Mood normal.        Behavior: Behavior normal.           Lab Results  Component Value Date   WBC 5.7 12/16/2023   HGB  14.5 12/16/2023   HCT 44.5 12/16/2023   PLT 330.0 12/16/2023   GLUCOSE 113 (H) 12/16/2023   CHOL 202 (H) 12/16/2023   TRIG 81.0 12/16/2023   HDL 59.10 12/16/2023  LDLCALC 126 (H) 12/16/2023   ALT 15 12/16/2023   AST 20 12/16/2023   NA 134 (L) 12/16/2023   K 4.4 12/16/2023   CL 97 12/16/2023   CREATININE 0.74 12/16/2023   BUN 16 12/16/2023   CO2 29 12/16/2023   TSH 1.45 12/16/2023   HGBA1C 6.0 12/16/2023    CT Abdomen Pelvis W Contrast Result Date: 10/22/2022 CLINICAL DATA:  Abdominal pain. EXAM: CT ABDOMEN AND PELVIS WITH CONTRAST TECHNIQUE: Multidetector CT imaging of the abdomen and pelvis was performed using the standard protocol following bolus administration of intravenous contrast. RADIATION DOSE REDUCTION: This exam was performed according to the departmental dose-optimization program which includes automated exposure control, adjustment of the mA and/or kV according to patient size and/or use of iterative reconstruction technique. CONTRAST:  OMNIPAQUE IOHEXOL 300 MG/ML  SOLN COMPARISON:  CT abdomen pelvis dated 10/02/2016. FINDINGS: Lower chest: Minimal bibasilar dependent atelectasis. There is coronary vascular calcification. There is a partially visualized large hiatal hernia containing the entire stomach which has increased in size since the prior CT. There is apparent gastric volvulus. The stomach is distended with fluid content and findings concerning for a degree of gastric outlet obstruction. Upper GI study or small-bowel series may provide better evaluation. There is moderate mass effect and compression of the heart by the large hiatal hernia. No intra-abdominal free air or free fluid. Hepatobiliary: The liver is unremarkable. Small right liver cyst. No biliary dilatation. The gallbladder is unremarkable. Pancreas: Unremarkable. No pancreatic ductal dilatation or surrounding inflammatory changes. Spleen: Normal in size without focal abnormality. Adrenals/Urinary Tract:  The adrenal glands are unremarkable. There is a 7.5 cm right renal interpolar cyst. Indeterminate hypoenhancing solid lesion in the superior pole of the right kidney measures 2.5 cm and not characterized on this CT. Further characterization with MRI without and with contrast on a nonemergent/outpatient basis recommended. There is no hydronephrosis on either side. There is symmetric enhancement and excretion of contrast by both kidneys. The visualized ureters and urinary bladder appear unremarkable. Stomach/Bowel: There is sigmoid diverticulosis without active inflammatory changes. There is herniation of a segment of the transverse colon into the hiatal hernia. The appendix is normal. Vascular/Lymphatic: Moderate aortoiliac atherosclerotic disease. The IVC is unremarkable. No portal venous gas. There is no adenopathy. Reproductive: Hysterectomy.  A pessary is noted.  No adnexal masses. Other: There is a small right inguinal hernia containing a loop of small bowel. No evidence of obstruction or inflammation. Musculoskeletal: Osteopenia with scoliosis and degenerative changes of the spine. No acute osseous pathology. IMPRESSION: 1. Partially visualized large hiatal hernia containing the entire stomach which has increased in size since the prior CT. There is apparent gastric volvulus with findings of possible gastric outlet obstruction. Upper GI study or small-bowel series may provide better evaluation. 2. Sigmoid diverticulosis.  Normal appendix. 3. Small right inguinal hernia containing a loop of small bowel. No evidence of obstruction or inflammation. 4. Indeterminate hypoenhancing solid lesion in the superior pole of the right kidney. Further characterization with MRI without and with contrast on a nonemergent/outpatient basis recommended. 5.  Aortic Atherosclerosis (ICD10-I70.0). Electronically Signed   By: Elgie Collard M.D.   On: 10/22/2022 23:24       Assessment & Plan:  Atrial fibrillation with rapid  ventricular response (HCC) -     TSH  Hypercholesterolemia Assessment & Plan: Intolerant to statin medication. Hold on restarting medication. Follow.   Orders: -     Hepatic function panel -     Lipid  panel  Hyperglycemia Assessment & Plan: Low carb diet and exercise.  Follow met b and a1c.   Orders: -     Hemoglobin A1c  Essential hypertension, benign Assessment & Plan: Remain off blood pressure medication. Blood pressure is doing well. Follow.   Orders: -     Basic metabolic panel -     CBC with Differential/Platelet  Unstable balance Assessment & Plan: With peripheral neuropathy and back pain, will need to continue to use her walker to help ambulate.  Rx given for new walker.    Generalized weakness -     For home use only DME 4 wheeled rolling walker with seat  Other polyneuropathy Assessment & Plan: Uses a walker. Follow.     History of CVA (cerebrovascular accident) Assessment & Plan: Will restart aspirin. Off eliquis. Discussed increased risk of stroke with afib. Has had some falls. Discussed risk of increased bleeding with eliquis. After discussion, pt elected to take aspirin daily. Follow.    Chronic anxiety Assessment & Plan: Does have a history of anxiety. Currently on lorazepam - taking 1 1/2 tablet q hs.  Also taking trazodone 1/2 tablet q hs. Discussed recommendation to decrease lorazepam dosage and increase trazodone if needs something more for sleep.  Follow.    Atrial fibrillation, unspecified type [facility]) Assessment & Plan: Will restart aspirin. Off eliquis. Discussed increased risk of stroke with afib. Has had some falls. Discussed risk of increased bleeding with eliquis. After discussion, pt elected to take aspirin daily. Follow.    Aortic atherosclerosis (HCC) Assessment & Plan: Intolerant to statin medication.  Low cholesterol diet.    Other orders -     LORazepam; Take 1 tablet (1 mg total) by mouth at bedtime as needed for anxiety.   Dispense: 30 tablet; Refill: 2 -     traZODone HCl; Take 1 tablet (50 mg total) by mouth at bedtime.  Dispense: 30 tablet; Refill: 2   I spent 45 minutes with the patient .  Time spent discussing her current concerns and symptoms. Specifically time spent discussing her history of afib, eating and further treatment. Time also spent discussing further w/up, evaluation and treatment.    Dale Upton, MD

## 2023-12-19 ENCOUNTER — Encounter: Payer: Self-pay | Admitting: Internal Medicine

## 2023-12-19 NOTE — Assessment & Plan Note (Signed)
Uses a walker. Follow.

## 2023-12-19 NOTE — Assessment & Plan Note (Signed)
Intolerant to statin medication.  Low cholesterol diet.  

## 2023-12-19 NOTE — Assessment & Plan Note (Signed)
With peripheral neuropathy and back pain, will need to continue to use her walker to help ambulate.  Rx given for new walker.

## 2023-12-19 NOTE — Assessment & Plan Note (Signed)
Low carb diet and exercise.  Follow met b and a1c.   

## 2023-12-19 NOTE — Assessment & Plan Note (Signed)
Intolerant to statin medication. Hold on restarting medication. Follow.

## 2023-12-19 NOTE — Assessment & Plan Note (Signed)
Does have a history of anxiety. Currently on lorazepam - taking 1 1/2 tablet q hs.  Also taking trazodone 1/2 tablet q hs. Discussed recommendation to decrease lorazepam dosage and increase trazodone if needs something more for sleep.  Follow.

## 2023-12-19 NOTE — Assessment & Plan Note (Signed)
Remain off blood pressure medication. Blood pressure is doing well. Follow.

## 2023-12-19 NOTE — Assessment & Plan Note (Signed)
Will restart aspirin. Off eliquis. Discussed increased risk of stroke with afib. Has had some falls. Discussed risk of increased bleeding with eliquis. After discussion, pt elected to take aspirin daily. Follow.

## 2023-12-23 ENCOUNTER — Telehealth: Payer: Self-pay

## 2023-12-23 ENCOUNTER — Ambulatory Visit: Payer: Self-pay | Admitting: Internal Medicine

## 2023-12-23 NOTE — Telephone Encounter (Addendum)
2nd, attempt, LMOM to call office. Routing to call back.  1st attempt, LMOM to call office. Routing to call back.  Copied from CRM [id number]. Topic: Clinical - Medication Question >> Dec 23, 2023  9:15 AM Shelbie Proctor wrote: Reason for CRM: Patient [phone] states LORazepam (ATIVAN) 1 MG and traZODone (DESYREL) 50 MG tablet is not sleeping at all. Patient is unsure on which medications, she's under the impression Lorazepam 50 mg is what she's taking. Patient was sleeping better with another medication and wants to speak with a nurse. [facility] - [place] [facility] [place] - [address] [place] [address] Phone: [phone] Fax: [phone]

## 2023-12-23 NOTE — Telephone Encounter (Signed)
Copied from CRM [id number]. Topic: Clinical - Lab/Test Results >> Dec 23, 2023  2:23 PM Sandra Reyes wrote: Reason for CRM: Patient called in would like for Lab Results to be sent to her through mail

## 2023-12-23 NOTE — Telephone Encounter (Signed)
3rd attempt to call patient. No answer. Left voicemail.   Copied from CRM [id number]. Topic: Clinical - Medication Question >> Dec 23, 2023  9:15 AM Shelbie Proctor wrote: Reason for CRM: Patient [phone] states LORazepam (ATIVAN) 1 MG and traZODone (DESYREL) 50 MG tablet is not sleeping at all. Patient is unsure on which medications, she's under the impression Lorazepam 50 mg is what she's taking. Patient was sleeping better with another medication and wants to speak with a nurse. [facility] - [place], [place] - [address] [place] [address] Phone: [phone] Fax: [phone]

## 2023-12-24 ENCOUNTER — Ambulatory Visit: Payer: Self-pay | Admitting: Internal Medicine

## 2023-12-24 NOTE — Telephone Encounter (Signed)
Chief Complaint: blurry vision  Symptoms: blurry vision Frequency: about 2 weeks ago Pertinent Negatives: Patient denies dizziness, chest pain, SOB Disposition: [] ED /[] Urgent Care (no appt availability in office) / [] Appointment(In office/virtual)/ []  [facility]/ [] Home Care/ [x] Refused Recommended Disposition /[] [place] [facility]/ [x]  Follow-up with PCP Additional Notes: Patient c/o blurry vision. Endorses that it may be r/t trazodone that she started 2 weeks ago. Reports that she can see everything in the room, but is a little more blurry than usual. Of note, does use glasses and walker. Reports that she has a caregiver with her at home. Triager had some difficulty understanding medication regimen (lorazepam and trazodone) that patient was describing, but reinforced to patient medication list on file: Medication List As of 12/19/2023  9:01 PM LORazepam 1 mg Oral At bedtime PRN traZODone HCl 50 mg Oral Daily at bedtime Patient requesting feedback from PCP if she should continue with Trazodone. Patient verbalized understanding and to call back with worsening symptoms. Of note, pt has upcoming appt in 3 weeks.   Copied from CRM [id number]. Topic: Clinical - Red Word Triage >> Dec 24, 2023  3:32 PM Deaijah H wrote: Red Word that prompted transfer to Nurse Triage: Medication causing blurred vision Reason for Disposition  [1] Blurred vision or visual changes AND [2] gradual onset (e.g., weeks, months)  Answer Assessment - Initial Assessment Questions 1. DESCRIPTION: "How has your vision changed?" (e.g., complete vision loss, blurred vision, double vision, floaters, etc.)     Blurry vision - I can see everything in the room, but it's blurry 2. LOCATION: "One or both eyes?" If one, ask: "Which eye?"     both 3. SEVERITY: "Can you see anything?" If Yes, ask: "What can you see?" (e.g., fine print)     I can see everything in the room, but it's blurry Of note, pt wears glasses 4.  ONSET: "When did this begin?" "Did it start suddenly or has this been gradual?"     3-4 days after taking trazodone 5. PATTERN: "Does this come and go, or has it been constant since it started?"     Constant  6. PAIN: "Is there any pain in your eye(s)?"  (Scale 1-10; or mild, moderate, severe)   - NONE (0): No pain.   - MILD (1-3): Doesn't interfere with normal activities.   - MODERATE (4-7): Interferes with normal activities or awakens from sleep.    - SEVERE (8-10): Excruciating pain, unable to do any normal activities.     no 7. CONTACTS-GLASSES: "Do you wear contacts or glasses?"     Yes, glasses 8. CAUSE: "What do you think is causing this visual problem?"     Trazodone started taken 2 weeks 9. OTHER SYMPTOMS: "Do you have any other symptoms?" (e.g., confusion, headache, arm or leg weakness, speech problems)     Left leg jerking at night Reports neuropathy and arthritis hx  Protocols used: Vision Loss or Change-A-AH

## 2023-12-24 NOTE — Telephone Encounter (Signed)
FYI-  Pt called wanting to increase her ativan to 2 mg at bedtime and said that the new medication trazodone was not working for her. Advised that per your last note we are not increasing ativan, we decreased by 1/2 tab. Increased trazodone to 1 tablet. Also advised that trazodone was not a new medication. Pt stated she was on xanax for years and then hospice changed her to ativan. Pt advised for me to call her daughter.   Called daughter and daughter was agreeable with plan in your last note and did not want to change any of her medications at this time. She said that she does not want her taking more ativan. She did ask for me to set them up for an appt in a few weeks to discuss. Appt scheduled.

## 2023-12-24 NOTE — Telephone Encounter (Signed)
See other note

## 2023-12-24 NOTE — Telephone Encounter (Signed)
Copied from CRM [id number]. Topic: Clinical - Prescription Issue >> Dec 24, 2023 12:56 PM Morrie Sheldon D wrote: Reason for CRM:Pt would like to be called regarding a follow-up message about her medications and her not sleeping at night

## 2023-12-27 ENCOUNTER — Telehealth: Payer: Self-pay | Admitting: Internal Medicine

## 2023-12-27 NOTE — Telephone Encounter (Signed)
LM for daughter to follow up

## 2023-12-27 NOTE — Telephone Encounter (Signed)
Copied from CRM [id number]. Topic: General - Other >> Dec 27, 2023 12:19 PM Pascal Lux wrote: Reason for CRM: Patient daughter Cordelia Pen called stated she just received a call from [place]. Upon calling CAL was advised to leave CRM for a call back to [place]: [phone]

## 2023-12-28 ENCOUNTER — Telehealth: Payer: Self-pay | Admitting: Internal Medicine

## 2023-12-28 NOTE — Telephone Encounter (Signed)
2/10 visit will be a virtual

## 2023-12-28 NOTE — Telephone Encounter (Signed)
Spoke with daughter to confirm no acute issues with her mom after receiving report from her about blurred vision. Daughter states that she is not having issues that she has voiced to her. Confirmed pt doing ok. Has virtual appt on 2/10 to discuss sleep

## 2023-12-28 NOTE — Telephone Encounter (Signed)
Copied from CRM [id number]. Topic: Appointments - Scheduling Inquiry for Clinic >> Dec 28, 2023  8:06 AM Deaijah H wrote: Reason for CRM: Patient called in to let Dr. Lorin Picket know she doesn't want to come into the office she would like Feb 10th to a telephone visit / if needed, please call [phone]

## 2023-12-28 NOTE — Telephone Encounter (Signed)
See other message from daughter Cordelia Pen

## 2024-01-06 ENCOUNTER — Ambulatory Visit: Payer: Self-pay | Admitting: Internal Medicine

## 2024-01-06 NOTE — Telephone Encounter (Signed)
 Copied from CRM [id number]. Topic: Clinical - Red Word Triage >> Jan 06, 2024 10:19 AM Isabell A wrote: [facility] that prompted transfer to Nurse Triage: Patient's daughter Joen on the line, states she is light headed & trying to take more medicine.   Chief Complaint: dizziness Symptoms: dizzy spell Frequency: intermittent Pertinent Negatives: Patient denies chest pain, headache Disposition: [] ED /[] Urgent Care (no appt availability in office) / [] Appointment(In office/virtual)/ []  [facility]/ [] Home Care/ [] Refused Recommended Disposition /[] [place] [facility]/ [x]  Follow-up with PCP Additional Notes: Per daughter, patient has ongoing history of taking laxatives every other night and not drinking as much. Per daughter, patient has been wanting to take more medications but daughter has taken them from her house. Patient lives alone, relatives stay overnight with her. Pt ambulating to/from bathroom independently.  Pt does have VV appt  on 2/10, per daughter, pt refusing to come into office sooner. Daughter just wanting to make PCP aware for upcoming visit. Will route to clinic [facility] for follow-up.  Reason for Disposition  [1] MILD dizziness (e.g., walking normally) AND [2] has been evaluated by doctor (or NP/PA) for this  Dizziness caused by not drinking enough fluids  Answer Assessment - Initial Assessment Questions 1. DESCRIPTION: Describe your dizziness.     Feels lightheaded  2. LIGHTHEADED: Do you feel lightheaded? (e.g., somewhat faint, woozy, weak upon standing)     Feels faint  3. VERTIGO: Do you feel like either you or the room is spinning or tilting? (i.e. vertigo)     No  4. SEVERITY: How bad is it?  Do you feel like you are going to faint? Can you stand and walk?   - MILD: Feels slightly dizzy, but walking normally.   - MODERATE: Feels unsteady when walking, but not falling; interferes with normal activities (e.g., school, work).   - SEVERE: Unable to  walk without falling, or requires assistance to walk without falling; feels like passing out now.      Mild  5. ONSET:  When did the dizziness begin?     This morning  6. AGGRAVATING FACTORS: Does anything make it worse? (e.g., standing, change in head position)     Getting upset after talking to sister last night  7. HEART RATE: Can you tell me your heart rate? How many beats in 15 seconds?  (Note: not all patients can do this)       No, unsure of heart rate  8. CAUSE: What do you think is causing the dizziness?     Taking laxative every other day, per daughter  65. RECURRENT SYMPTOM: Have you had dizziness before? If Yes, ask: When was the last time? What happened that time?     16 months ago, when she was taking too much laxative  10. OTHER SYMPTOMS: Do you have any other symptoms? (e.g., fever, chest pain, vomiting, diarrhea, bleeding)       No other symptoms  11. PREGNANCY: Is there any chance you are pregnant? When was your last menstrual period?       No  Protocols used: Dizziness - Lightheadedness-A-AH

## 2024-01-06 NOTE — Telephone Encounter (Signed)
 Called patient to follow up on her dizziness, Patient says that she has been having some dizzy spells and she used to have xanax  to help with this. Advised patient that xanax  does not treat dizziness. Also advised that if she has acute onset dizziness, needs to be evaluated asap. Pt declined and stated she has a virtual visit on Monday so that she can get her medication straightened out with Dr Glendia.

## 2024-01-07 ENCOUNTER — Telehealth: Payer: Self-pay

## 2024-01-07 NOTE — Telephone Encounter (Signed)
 RICK- spoke with daughter. She was calling in to report that her mom has been having blurry vision for several months. Advised daughter that she needs to have her mom evaluated over the weekend because cute onset blurred vision is not something that should wait through the weekend. The daughter stated that she is aware but this is not something new. She says she was just trying to keep you in the loop for the appointment on Monday. Daughter says she has also been asking her other daughter for xanax .   Pt is not staying alone. She has not had any falls. Daughter confirmed she is doing ok.

## 2024-01-07 NOTE — Telephone Encounter (Signed)
 Copied from CRM [id number]. Topic: Clinical - Medical Advice >> Jan 07, 2024  8:40 AM Franky GRADE wrote: Reason for CRM: Patient's daughter is calling back to speak with Sueanne as she spoke with the patient yesterday, States that she wants to inform Sueanne that her mother isn't really dealing with dizziness but she is experiencing blurred vision. She would like to speak with Sueanne if possible, her best call back number is [phone].

## 2024-01-07 NOTE — Telephone Encounter (Signed)
LM for daughter

## 2024-01-10 ENCOUNTER — Telehealth (INDEPENDENT_AMBULATORY_CARE_PROVIDER_SITE_OTHER): Payer: Medicare Other | Admitting: Internal Medicine

## 2024-01-10 DIAGNOSIS — E7800 Pure hypercholesterolemia, unspecified: Secondary | ICD-10-CM | POA: Diagnosis not present

## 2024-01-10 DIAGNOSIS — R739 Hyperglycemia, unspecified: Secondary | ICD-10-CM

## 2024-01-10 DIAGNOSIS — I10 Essential (primary) hypertension: Secondary | ICD-10-CM | POA: Diagnosis not present

## 2024-01-10 DIAGNOSIS — I700 Atherosclerosis of aorta: Secondary | ICD-10-CM

## 2024-01-10 DIAGNOSIS — Z8673 Personal history of transient ischemic attack (TIA), and cerebral infarction without residual deficits: Secondary | ICD-10-CM

## 2024-01-10 DIAGNOSIS — R2689 Other abnormalities of gait and mobility: Secondary | ICD-10-CM | POA: Diagnosis not present

## 2024-01-10 DIAGNOSIS — G6289 Other specified polyneuropathies: Secondary | ICD-10-CM | POA: Diagnosis not present

## 2024-01-10 DIAGNOSIS — I4891 Unspecified atrial fibrillation: Secondary | ICD-10-CM | POA: Diagnosis not present

## 2024-01-10 DIAGNOSIS — F419 Anxiety disorder, unspecified: Secondary | ICD-10-CM

## 2024-01-10 NOTE — Progress Notes (Signed)
 [name], female   DOB: 04/27/1930, 89 y.o.   MRN: [id number]   Virtual Visit via video Note  I connected with Sandra Reyes by a video enabled telemedicine application and verified that I am speaking with the correct person using two identifiers. Location patient: home Location provider: work Persons participating in the virtual visit: patient, provider and pts daughter.   The limitations, risks, security and privacy concerns of performing an evaluation and management service by video and the availability of in person appointments have been discussed. It has also been discussed with the patient that there may be a patient responsible charge related to this service. The patient expressed understanding and agreed to proceed.   Reason for visit: work in appt  HPI: Work in to discuss her sleep and use of benzodiazepines. Reports she is eating ok. Swallowing ok. She is making her meals uses a walker to get around. Takes tylenol. When under hospice care, she was receiving increased lorazepam. Hospice (prior to her discharge from hospice) started tapering her lorazepam. She feels she needs to be back on increased dose of lorazepam. Reports some increased anxiety at times. Main concern is sleep. Hospice had started trying to transition her to trazodone. She feels she does not tolerate this medication. Increased side effects. Was questioning if this could be contributing to her sleep issues. Has cut herself back to 1/2 tablet. Feeling some better. Discussed goal would be to eventually get her off lorazepam or at least have her on lower dose.    ROS: See pertinent positives and negatives per HPI.  Past Medical History:  Diagnosis Date   Anxiety    Collagen vascular disease (HCC)    Degenerative disc disease    Depression    GERD (gastroesophageal reflux disease)    Hernia, hiatal    Hyperlipidemia    Hypertension     Past Surgical History:  Procedure Laterality Date    ABDOMINAL HYSTERECTOMY  1997   uterus and cervix removed, reports she still has her ovaries   BREAST SURGERY  1960 to 1970   lumps removed   CYSTOCELE REPAIR     ESOPHAGOGASTRODUODENOSCOPY (EGD) WITH PROPOFOL N/A 10/03/2016   Procedure: ESOPHAGOGASTRODUODENOSCOPY (EGD) WITH PROPOFOL;  Surgeon: Scot Jun, MD;  Location: [facility];  Service: Endoscopy;  Laterality: N/A;   RECTOCELE REPAIR      Family History  Problem Relation Age of Onset   Heart disease Mother    Arthritis Mother    Ulcers Father        uremic poisoning   Arthritis Sister        x 4   Hyperlipidemia Sister        x 1   Hypertension Sister    Cancer Sister        x 2 (ovarian cancer) & 1 (colon cancer)   Diabetes Sister    Dementia Brother    Dementia Brother     SOCIAL HX: reviewed.    Current Outpatient Medications:    aspirin 81 MG chewable tablet, Chew by mouth daily., Disp: , Rfl:    LORazepam (ATIVAN) 1 MG tablet, Take 1 tablet (1 mg total) by mouth at bedtime as needed for anxiety., Disp: 30 tablet, Rfl: 2   traZODone (DESYREL) 50 MG tablet, Take 1 tablet (50 mg total) by mouth at bedtime. (Patient taking differently: Take 25 mg by mouth at bedtime.), Disp: 30 tablet, Rfl: 2  EXAM:  GENERAL: alert, oriented, appears well and  in no acute distress  HEENT: atraumatic, conjunttiva clear, no obvious abnormalities on inspection of external nose and ears  NECK: normal movements of the head and neck  LUNGS: on inspection no signs of respiratory distress, breathing rate appears normal, no obvious gross SOB, gasping or wheezing  CV: no obvious cyanosis  PSYCH/NEURO: pleasant and cooperative, no obvious depression or anxiety, speech and thought processing grossly intact  ASSESSMENT AND PLAN:  Discussed the following assessment and plan:  Problem List Items Addressed This Visit     Unstable balance - Primary   Continue to use walker. Has history of peripheral neuropathy.        Peripheral neuropathy   Uses a walker. Follow.        Hyperglycemia   Low carb diet and exercise.  Follow met b and a1c.       Hypercholesterolemia   Intolerant to statin medication. Hold on restarting medication. Follow.       History of CVA (cerebrovascular accident)   Continue aspirin. Off eliquis. Discussion as outlined in last note.       Essential hypertension, benign   Remain off blood pressure medication. Blood pressure has been doing well. Follow.       Chronic anxiety   Does have a history of anxiety. Currently on lorazepam - now taking 1 ablet q hs.  Also taking trazodone 1/2 tablet q hs. Feels is not tolerating trazodone. May be worsening some of her anxiety symptoms. Will stop trazosone. Continue lorazepam at current dose for now. Discussed recommendation to eventually decrease lorazepam dosage. Follow.       Atrial fibrillation (HCC)   Continue on aspirin. Off eliquis. Have discussed increased risk of stroke with afib. Has had some falls. Discussed risk of increased bleeding with eliquis. After previous discussion, pt elected to take aspirin daily. Follow.       Aortic atherosclerosis (HCC)   Intolerant to statin medication.  Low cholesterol diet.        Return if symptoms worsen or fail to improve.   I discussed the assessment and treatment plan with the patient. The patient was provided an opportunity to ask questions and all were answered. The patient agreed with the plan and demonstrated an understanding of the instructions.   The patient was advised to call back or seek an in-person evaluation if the symptoms worsen or if the condition fails to improve as anticipated.   Dale Belmont, MD

## 2024-01-16 ENCOUNTER — Encounter: Payer: Self-pay | Admitting: Internal Medicine

## 2024-01-16 NOTE — Assessment & Plan Note (Signed)
 Intolerant to statin medication. Hold on restarting medication. Follow.

## 2024-01-16 NOTE — Assessment & Plan Note (Signed)
 Continue on aspirin. Off eliquis. Have discussed increased risk of stroke with afib. Has had some falls. Discussed risk of increased bleeding with eliquis. After previous discussion, pt elected to take aspirin daily. Follow.

## 2024-01-16 NOTE — Assessment & Plan Note (Signed)
Intolerant to statin medication.  Low cholesterol diet.  

## 2024-01-16 NOTE — Assessment & Plan Note (Signed)
 Does have a history of anxiety. Currently on lorazepam - now taking 1 ablet q hs.  Also taking trazodone 1/2 tablet q hs. Feels is not tolerating trazodone. May be worsening some of her anxiety symptoms. Will stop trazosone. Continue lorazepam at current dose for now. Discussed recommendation to eventually decrease lorazepam dosage. Follow.

## 2024-01-16 NOTE — Assessment & Plan Note (Signed)
 Uses a walker. Follow.

## 2024-01-16 NOTE — Assessment & Plan Note (Signed)
 Remain off blood pressure medication. Blood pressure has been doing well. Follow.

## 2024-01-16 NOTE — Assessment & Plan Note (Signed)
 Continue aspirin. Off eliquis. Discussion as outlined in last note.

## 2024-01-16 NOTE — Assessment & Plan Note (Signed)
 Low carb diet and exercise.  Follow met b and a1c.

## 2024-01-16 NOTE — Assessment & Plan Note (Signed)
 Continue to use walker. Has history of peripheral neuropathy.

## 2024-01-31 ENCOUNTER — Telehealth: Payer: Self-pay

## 2024-01-31 NOTE — Telephone Encounter (Signed)
 Patient says that she is currently taking 1 mg of ativan and says that it is not enough. She says that she used to be on xanax and it worked Fire[employer]. She was taken off xanax while on hospice and prescribed ativan. She says she was taking 2 mg of ativan and then discussed lowering dose with you once she was discharged from hospice but that is not working for her. She says 1 mg is not enough, its takes her 3 hours to go to sleep and she can't keep going on like this.

## 2024-01-31 NOTE — Telephone Encounter (Signed)
 Copied from CRM [id number]. Topic: Clinical - Medication Question >> Jan 31, 2024  8:40 AM Sandra Reyes wrote: Reason for CRM: Patient would like to speak with Bethann Berkshire regarding sleeping medication LORazepam (ATIVAN) 1 MG tablet, she is using them as prescribed and it's not helping her at all. She's only getting 4-5 hours of sleep.

## 2024-01-31 NOTE — Telephone Encounter (Signed)
 I had discussed with her regarding lorazepam and desire to actually taper her off this medication instead of increasing the dose. Given her persistent issue with sleep, I would like to refer her to psychiatry for further evaluation. She has tried trazodone.

## 2024-01-31 NOTE — Telephone Encounter (Signed)
 Copied from CRM [id number]. Topic: Clinical - Medication Question >> Jan 31, 2024  8:40 AM Almira Coaster wrote: Reason for CRM: Patient would like to speak with Bethann Berkshire regarding sleeping medication LORazepam (ATIVAN) 1 MG tablet, she is using them as prescribed and it's not helping her at all. She's only getting 4-5 hours of sleep. >> Jan 31, 2024 11:51 AM Martinique E wrote: Patient called back regarding this same issue. Relayed to patient that the note was sent to Dr. Lorin Picket and advised patient the staff may be at lunch during this hour. Relayed to patient that they will respond when message is seen.

## 2024-02-01 ENCOUNTER — Telehealth: Payer: Self-pay

## 2024-02-01 NOTE — Telephone Encounter (Signed)
 See other note

## 2024-02-01 NOTE — Telephone Encounter (Signed)
 Patient aware of below and says she does not need to see psychiatry. She will just stay on what she is on and deal with it.

## 2024-02-01 NOTE — Telephone Encounter (Signed)
 Copied from CRM [id number]. Topic: Clinical - Medication Question >> Feb 01, 2024 12:30 PM Kathryne Eriksson wrote: Reason for CRM: Requesting A Call Back >> Feb 01, 2024 12:32 PM Kathryne Eriksson wrote: Patient is urgently requesting a call back from [facility], [place] in regards to her medication. Patient call back number is [phone]

## 2024-02-14 ENCOUNTER — Other Ambulatory Visit: Payer: Self-pay | Admitting: Internal Medicine

## 2024-02-14 NOTE — Telephone Encounter (Signed)
 PDMP reviewed. Rx ok'd for lorazepam.

## 2024-02-14 NOTE — Telephone Encounter (Signed)
 Copied from CRM [id number]. Topic: Clinical - Medication Question >> Feb 14, 2024  9:57 AM Isabell A wrote: Reason for CRM: Patient is requesting the medication for her indigestion - she does not know the name of it - states she as been on this medicine before.    Callback number: [phone]

## 2024-02-17 DIAGNOSIS — I4891 Unspecified atrial fibrillation: Secondary | ICD-10-CM | POA: Diagnosis not present

## 2024-02-17 DIAGNOSIS — I10 Essential (primary) hypertension: Secondary | ICD-10-CM | POA: Diagnosis not present

## 2024-02-17 DIAGNOSIS — K449 Diaphragmatic hernia without obstruction or gangrene: Secondary | ICD-10-CM | POA: Diagnosis not present

## 2024-03-16 ENCOUNTER — Other Ambulatory Visit: Payer: Self-pay | Admitting: Internal Medicine

## 2024-03-16 NOTE — Telephone Encounter (Signed)
 Rx ok'd for lorazepam #30 with no refills. Has appt next week

## 2024-03-20 ENCOUNTER — Ambulatory Visit: Payer: Medicare Other | Admitting: Internal Medicine

## 2024-03-29 DIAGNOSIS — Z9189 Other specified personal risk factors, not elsewhere classified: Secondary | ICD-10-CM | POA: Diagnosis not present

## 2024-03-29 DIAGNOSIS — D6869 Other thrombophilia: Secondary | ICD-10-CM | POA: Diagnosis not present

## 2024-03-29 DIAGNOSIS — I480 Paroxysmal atrial fibrillation: Secondary | ICD-10-CM | POA: Diagnosis not present

## 2024-03-29 DIAGNOSIS — G2581 Restless legs syndrome: Secondary | ICD-10-CM | POA: Diagnosis not present

## 2024-03-29 DIAGNOSIS — I10 Essential (primary) hypertension: Secondary | ICD-10-CM | POA: Diagnosis not present

## 2024-03-29 DIAGNOSIS — E785 Hyperlipidemia, unspecified: Secondary | ICD-10-CM | POA: Diagnosis not present

## 2024-03-29 DIAGNOSIS — I700 Atherosclerosis of aorta: Secondary | ICD-10-CM | POA: Diagnosis not present

## 2024-03-29 DIAGNOSIS — M3589 Other specified systemic involvement of connective tissue: Secondary | ICD-10-CM | POA: Diagnosis not present

## 2024-05-15 ENCOUNTER — Other Ambulatory Visit: Payer: Self-pay | Admitting: Internal Medicine

## 2024-05-15 NOTE — Telephone Encounter (Signed)
 Have not seen her since 01/2024. Is she continuing to follow here. Needs a f/u appt scheduled. If needs lorazepam , how often is she taking?

## 2024-05-16 NOTE — Telephone Encounter (Signed)
 Patient daughter called back in regarding a call from the office she would like a call back

## 2024-05-16 NOTE — Telephone Encounter (Signed)
LM for daughter

## 2024-05-16 NOTE — Telephone Encounter (Signed)
 Patient daughter does not want a call back regarding this phone call it was a incorrect call from patient

## 2024-06-08 DIAGNOSIS — I10 Essential (primary) hypertension: Secondary | ICD-10-CM | POA: Diagnosis not present

## 2024-06-08 DIAGNOSIS — Z79899 Other long term (current) drug therapy: Secondary | ICD-10-CM | POA: Diagnosis not present

## 2024-07-08 DIAGNOSIS — I10 Essential (primary) hypertension: Secondary | ICD-10-CM | POA: Diagnosis not present

## 2024-07-25 DIAGNOSIS — E871 Hypo-osmolality and hyponatremia: Secondary | ICD-10-CM | POA: Diagnosis not present

## 2024-07-25 DIAGNOSIS — Z713 Dietary counseling and surveillance: Secondary | ICD-10-CM | POA: Diagnosis not present

## 2024-07-25 DIAGNOSIS — I10 Essential (primary) hypertension: Secondary | ICD-10-CM | POA: Diagnosis not present

## 2024-07-25 DIAGNOSIS — G629 Polyneuropathy, unspecified: Secondary | ICD-10-CM | POA: Diagnosis not present

## 2024-08-24 DIAGNOSIS — I10 Essential (primary) hypertension: Secondary | ICD-10-CM | POA: Diagnosis not present

## 2024-09-13 DIAGNOSIS — R001 Bradycardia, unspecified: Secondary | ICD-10-CM | POA: Diagnosis not present

## 2024-09-13 DIAGNOSIS — Z8673 Personal history of transient ischemic attack (TIA), and cerebral infarction without residual deficits: Secondary | ICD-10-CM | POA: Diagnosis not present

## 2024-09-13 DIAGNOSIS — I10 Essential (primary) hypertension: Secondary | ICD-10-CM | POA: Diagnosis not present

## 2024-09-13 DIAGNOSIS — I480 Paroxysmal atrial fibrillation: Secondary | ICD-10-CM | POA: Diagnosis not present
